# Patient Record
Sex: Male | Born: 1940
Health system: Southern US, Community
[De-identification: ages and names within clinical notes are randomized; demographics above are authoritative.]

## PROBLEM LIST (undated history)

## (undated) DIAGNOSIS — I4891 Unspecified atrial fibrillation: Secondary | ICD-10-CM

## (undated) DIAGNOSIS — R51 Headache: Secondary | ICD-10-CM

## (undated) DIAGNOSIS — H9190 Unspecified hearing loss, unspecified ear: Secondary | ICD-10-CM

## (undated) DIAGNOSIS — E119 Type 2 diabetes mellitus without complications: Secondary | ICD-10-CM

## (undated) DIAGNOSIS — C801 Malignant (primary) neoplasm, unspecified: Secondary | ICD-10-CM

## (undated) DIAGNOSIS — I10 Essential (primary) hypertension: Secondary | ICD-10-CM

## (undated) DIAGNOSIS — F0390 Unspecified dementia without behavioral disturbance: Secondary | ICD-10-CM

## (undated) DIAGNOSIS — E785 Hyperlipidemia, unspecified: Secondary | ICD-10-CM

## (undated) HISTORY — DX: Essential (primary) hypertension: I10

## (undated) HISTORY — DX: Headache: R51

## (undated) HISTORY — PX: OTHER SURGICAL HISTORY: SHX169

## (undated) HISTORY — DX: Hyperlipidemia, unspecified: E78.5

## (undated) HISTORY — DX: Type 2 diabetes mellitus without complications: E11.9

---

## 2000-12-29 ENCOUNTER — Encounter: Admission: RE | Admit: 2000-12-29 | Discharge: 2001-03-29 | Payer: Self-pay | Admitting: Family Medicine

## 2002-03-01 ENCOUNTER — Encounter: Admission: RE | Admit: 2002-03-01 | Discharge: 2002-03-01 | Payer: Self-pay | Admitting: *Deleted

## 2002-03-01 ENCOUNTER — Encounter: Payer: Self-pay | Admitting: *Deleted

## 2005-02-18 ENCOUNTER — Ambulatory Visit: Payer: Self-pay | Admitting: Gastroenterology

## 2011-11-05 DIAGNOSIS — E119 Type 2 diabetes mellitus without complications: Secondary | ICD-10-CM | POA: Diagnosis not present

## 2011-12-30 DIAGNOSIS — E785 Hyperlipidemia, unspecified: Secondary | ICD-10-CM | POA: Diagnosis not present

## 2011-12-30 DIAGNOSIS — E119 Type 2 diabetes mellitus without complications: Secondary | ICD-10-CM | POA: Diagnosis not present

## 2011-12-30 DIAGNOSIS — I10 Essential (primary) hypertension: Secondary | ICD-10-CM | POA: Diagnosis not present

## 2012-05-06 DIAGNOSIS — Z86010 Personal history of colonic polyps: Secondary | ICD-10-CM | POA: Diagnosis not present

## 2012-05-06 DIAGNOSIS — Z800 Family history of malignant neoplasm of digestive organs: Secondary | ICD-10-CM | POA: Diagnosis not present

## 2012-05-06 DIAGNOSIS — Z09 Encounter for follow-up examination after completed treatment for conditions other than malignant neoplasm: Secondary | ICD-10-CM | POA: Diagnosis not present

## 2012-05-06 DIAGNOSIS — K5730 Diverticulosis of large intestine without perforation or abscess without bleeding: Secondary | ICD-10-CM | POA: Diagnosis not present

## 2012-06-15 DIAGNOSIS — M25519 Pain in unspecified shoulder: Secondary | ICD-10-CM | POA: Diagnosis not present

## 2012-06-15 DIAGNOSIS — I10 Essential (primary) hypertension: Secondary | ICD-10-CM | POA: Diagnosis not present

## 2012-06-15 DIAGNOSIS — E785 Hyperlipidemia, unspecified: Secondary | ICD-10-CM | POA: Diagnosis not present

## 2012-06-15 DIAGNOSIS — Z125 Encounter for screening for malignant neoplasm of prostate: Secondary | ICD-10-CM | POA: Diagnosis not present

## 2012-06-15 DIAGNOSIS — Z1331 Encounter for screening for depression: Secondary | ICD-10-CM | POA: Diagnosis not present

## 2012-06-15 DIAGNOSIS — E119 Type 2 diabetes mellitus without complications: Secondary | ICD-10-CM | POA: Diagnosis not present

## 2012-12-14 DIAGNOSIS — Z0000 Encounter for general adult medical examination without abnormal findings: Secondary | ICD-10-CM | POA: Diagnosis not present

## 2012-12-14 DIAGNOSIS — E785 Hyperlipidemia, unspecified: Secondary | ICD-10-CM | POA: Diagnosis not present

## 2012-12-14 DIAGNOSIS — I10 Essential (primary) hypertension: Secondary | ICD-10-CM | POA: Diagnosis not present

## 2012-12-14 DIAGNOSIS — E119 Type 2 diabetes mellitus without complications: Secondary | ICD-10-CM | POA: Diagnosis not present

## 2012-12-17 DIAGNOSIS — E119 Type 2 diabetes mellitus without complications: Secondary | ICD-10-CM | POA: Diagnosis not present

## 2013-04-20 ENCOUNTER — Other Ambulatory Visit: Payer: Self-pay | Admitting: Family Medicine

## 2013-04-20 ENCOUNTER — Ambulatory Visit
Admission: RE | Admit: 2013-04-20 | Discharge: 2013-04-20 | Disposition: A | Discharge status: Home / Self Care | Payer: Medicare Other | Source: Ambulatory Visit | Attending: Family Medicine | Admitting: Family Medicine

## 2013-04-20 DIAGNOSIS — M7989 Other specified soft tissue disorders: Secondary | ICD-10-CM | POA: Diagnosis not present

## 2013-04-20 DIAGNOSIS — M79609 Pain in unspecified limb: Secondary | ICD-10-CM | POA: Diagnosis not present

## 2013-04-20 DIAGNOSIS — L02419 Cutaneous abscess of limb, unspecified: Secondary | ICD-10-CM | POA: Diagnosis not present

## 2013-04-20 DIAGNOSIS — R609 Edema, unspecified: Secondary | ICD-10-CM

## 2013-04-20 DIAGNOSIS — R52 Pain, unspecified: Secondary | ICD-10-CM

## 2013-06-17 DIAGNOSIS — I10 Essential (primary) hypertension: Secondary | ICD-10-CM | POA: Diagnosis not present

## 2013-06-17 DIAGNOSIS — E119 Type 2 diabetes mellitus without complications: Secondary | ICD-10-CM | POA: Diagnosis not present

## 2013-06-17 DIAGNOSIS — Z125 Encounter for screening for malignant neoplasm of prostate: Secondary | ICD-10-CM | POA: Diagnosis not present

## 2013-06-17 DIAGNOSIS — M25519 Pain in unspecified shoulder: Secondary | ICD-10-CM | POA: Diagnosis not present

## 2013-06-17 DIAGNOSIS — E785 Hyperlipidemia, unspecified: Secondary | ICD-10-CM | POA: Diagnosis not present

## 2013-07-23 DIAGNOSIS — E782 Mixed hyperlipidemia: Secondary | ICD-10-CM | POA: Diagnosis not present

## 2013-07-26 DIAGNOSIS — E119 Type 2 diabetes mellitus without complications: Secondary | ICD-10-CM | POA: Diagnosis not present

## 2013-07-26 DIAGNOSIS — R609 Edema, unspecified: Secondary | ICD-10-CM | POA: Diagnosis not present

## 2013-12-20 ENCOUNTER — Other Ambulatory Visit: Payer: Self-pay | Admitting: Family Medicine

## 2013-12-20 DIAGNOSIS — E119 Type 2 diabetes mellitus without complications: Secondary | ICD-10-CM | POA: Diagnosis not present

## 2013-12-20 DIAGNOSIS — Z125 Encounter for screening for malignant neoplasm of prostate: Secondary | ICD-10-CM | POA: Diagnosis not present

## 2013-12-20 DIAGNOSIS — Z0000 Encounter for general adult medical examination without abnormal findings: Secondary | ICD-10-CM | POA: Diagnosis not present

## 2013-12-20 DIAGNOSIS — I10 Essential (primary) hypertension: Secondary | ICD-10-CM | POA: Diagnosis not present

## 2013-12-20 DIAGNOSIS — M25519 Pain in unspecified shoulder: Secondary | ICD-10-CM | POA: Diagnosis not present

## 2013-12-20 DIAGNOSIS — Z87891 Personal history of nicotine dependence: Secondary | ICD-10-CM

## 2013-12-20 DIAGNOSIS — Z139 Encounter for screening, unspecified: Secondary | ICD-10-CM

## 2013-12-20 DIAGNOSIS — Z1331 Encounter for screening for depression: Secondary | ICD-10-CM | POA: Diagnosis not present

## 2013-12-20 DIAGNOSIS — Z23 Encounter for immunization: Secondary | ICD-10-CM | POA: Diagnosis not present

## 2013-12-20 DIAGNOSIS — E785 Hyperlipidemia, unspecified: Secondary | ICD-10-CM | POA: Diagnosis not present

## 2013-12-20 DIAGNOSIS — Z136 Encounter for screening for cardiovascular disorders: Secondary | ICD-10-CM | POA: Diagnosis not present

## 2013-12-27 ENCOUNTER — Ambulatory Visit
Admission: RE | Admit: 2013-12-27 | Discharge: 2013-12-27 | Disposition: A | Discharge status: Home / Self Care | Payer: Medicare Other | Source: Ambulatory Visit | Attending: Family Medicine | Admitting: Family Medicine

## 2013-12-27 DIAGNOSIS — Z139 Encounter for screening, unspecified: Secondary | ICD-10-CM

## 2013-12-27 DIAGNOSIS — I714 Abdominal aortic aneurysm, without rupture, unspecified: Secondary | ICD-10-CM | POA: Diagnosis not present

## 2013-12-27 DIAGNOSIS — Z87891 Personal history of nicotine dependence: Secondary | ICD-10-CM

## 2013-12-31 DIAGNOSIS — E119 Type 2 diabetes mellitus without complications: Secondary | ICD-10-CM | POA: Diagnosis not present

## 2014-01-11 DIAGNOSIS — E860 Dehydration: Secondary | ICD-10-CM | POA: Diagnosis not present

## 2014-01-18 ENCOUNTER — Ambulatory Visit (INDEPENDENT_AMBULATORY_CARE_PROVIDER_SITE_OTHER): Payer: Self-pay | Admitting: Family Medicine

## 2014-01-18 VITALS — BP 118/80 | HR 60 | Temp 97.9°F | Resp 17 | Ht 70.5 in | Wt 227.6 lb

## 2014-01-18 DIAGNOSIS — Z0289 Encounter for other administrative examinations: Secondary | ICD-10-CM

## 2014-01-18 NOTE — Progress Notes (Signed)
Chief Complaint:  Chief Complaint  Patient presents with  . DOT physical    HPI: Brian French is a 73 y.o. male who is here for  DOT PE Deneis any numbness/weakness/tingling/hypoglycemia UTD on eye exam, He jus had it 3 weeks, no diabetic retinopathy Deneis MI/OSA   Past Medical History  Diagnosis Date  . Diabetes mellitus without complication   . Hypertension   . Hyperlipidemia    Past Surgical History  Procedure Laterality Date  . Right testicular swelling      had I and D after swelling s/p trauma from scrotal  swelling, got hit in the testes during judo 1962   History   Social History  . Marital Status: Married    Spouse Name: N/A    Number of Children: N/A  . Years of Education: N/A   Social History Main Topics  . Smoking status: None  . Smokeless tobacco: None  . Alcohol Use: None  . Drug Use: None  . Sexual Activity: None   Other Topics Concern  . None   Social History Narrative  . None   Family History  Problem Relation Age of Onset  . Cancer Mother   . Heart disease Father    No Known Allergies Prior to Admission medications   Medication Sig Start Date End Date Taking? Authorizing Provider  atorvastatin (LIPITOR) 10 MG tablet Take 10 mg by mouth daily.   Yes Historical Provider, MD  glipiZIDE (GLUCOTROL) 5 MG tablet Take by mouth 2 (two) times daily before a meal.   Yes Historical Provider, MD  Liraglutide (VICTOZA) 18 MG/3ML SOPN Inject into the skin.   Yes Historical Provider, MD  lisinopril (PRINIVIL,ZESTRIL) 10 MG tablet Take 10 mg by mouth daily.   Yes Historical Provider, MD  meloxicam (MOBIC) 15 MG tablet Take 15 mg by mouth daily.   Yes Historical Provider, MD  metFORMIN (GLUCOPHAGE) 1000 MG tablet Take 1,000 mg by mouth 2 (two) times daily with a meal.   Yes Historical Provider, MD  Omega-3 Fatty Acids (FISH OIL) 1000 MG CAPS Take by mouth.   Yes Historical Provider, MD     ROS: The patient denies fevers, chills, night  sweats, unintentional weight loss, chest pain, palpitations, wheezing, dyspnea on exertion, nausea, vomiting, abdominal pain, dysuria, hematuria, melena, numbness, weakness, or tingling.   All other systems have been reviewed and were otherwise negative with the exception of those mentioned in the HPI and as above.    PHYSICAL EXAM: Filed Vitals:   01/18/14 1848  BP: 118/80  Pulse: 60  Temp: 97.9 F (36.6 C)  Resp: 17   Filed Vitals:   01/18/14 1848  Height: 5' 10.5" (1.791 m)  Weight: 227 lb 9.6 oz (103.239 kg)   Body mass index is 32.18 kg/(m^2).  General: Alert, no acute distress HEENT:  Normocephalic, atraumatic, oropharynx patent. EOMI, PERRLA, fundo exam nl Cardiovascular:  Regular rate and rhythm, no rubs murmurs or gallops.  No Carotid bruits, radial pulse intact. No pedal edema.  Respiratory: Clear to auscultation bilaterally.  No wheezes, rales, or rhonchi.  No cyanosis, no use of accessory musculature GI: No organomegaly, abdomen is soft and non-tender, positive bowel sounds.  No masses. Skin: No rashes. Neurologic: Facial musculature symmetric. Psychiatric: Patient is appropriate throughout our interaction. Lymphatic: No cervical lymphadenopathy Musculoskeletal: Gait intact. 5/5 stre, full ROM, 2/2 DTRS Neg hernia  LABS: No results found for this or any previous visit.   EKG/XRAY:  Primary read interpreted by Dr. Marin Comment at [facility].   ASSESSMENT/PLAN: Encounter Diagnosis  Name Primary?  . 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 Patient is aware that all medications have potential sideeffects and we are unable to predict every sideeffect or drug-drug interaction that may occur.  LE, [place], DO 01/18/2014 8:32 PM

## 2014-03-17 DIAGNOSIS — E782 Mixed hyperlipidemia: Secondary | ICD-10-CM | POA: Diagnosis not present

## 2014-06-23 DIAGNOSIS — R350 Frequency of micturition: Secondary | ICD-10-CM | POA: Diagnosis not present

## 2014-06-23 DIAGNOSIS — R609 Edema, unspecified: Secondary | ICD-10-CM | POA: Diagnosis not present

## 2014-06-23 DIAGNOSIS — E782 Mixed hyperlipidemia: Secondary | ICD-10-CM | POA: Diagnosis not present

## 2014-06-23 DIAGNOSIS — E119 Type 2 diabetes mellitus without complications: Secondary | ICD-10-CM | POA: Diagnosis not present

## 2014-06-23 DIAGNOSIS — R1905 Periumbilic swelling, mass or lump: Secondary | ICD-10-CM | POA: Diagnosis not present

## 2014-06-23 DIAGNOSIS — M25512 Pain in left shoulder: Secondary | ICD-10-CM | POA: Diagnosis not present

## 2014-06-23 DIAGNOSIS — I10 Essential (primary) hypertension: Secondary | ICD-10-CM | POA: Diagnosis not present

## 2014-06-23 DIAGNOSIS — K219 Gastro-esophageal reflux disease without esophagitis: Secondary | ICD-10-CM | POA: Diagnosis not present

## 2014-06-24 DIAGNOSIS — N400 Enlarged prostate without lower urinary tract symptoms: Secondary | ICD-10-CM | POA: Diagnosis not present

## 2014-06-24 DIAGNOSIS — K5790 Diverticulosis of intestine, part unspecified, without perforation or abscess without bleeding: Secondary | ICD-10-CM | POA: Diagnosis not present

## 2014-06-28 DIAGNOSIS — N3942 Incontinence without sensory awareness: Secondary | ICD-10-CM | POA: Diagnosis not present

## 2014-06-28 DIAGNOSIS — N401 Enlarged prostate with lower urinary tract symptoms: Secondary | ICD-10-CM | POA: Diagnosis not present

## 2014-06-28 DIAGNOSIS — Z125 Encounter for screening for malignant neoplasm of prostate: Secondary | ICD-10-CM | POA: Diagnosis not present

## 2014-06-28 DIAGNOSIS — N323 Diverticulum of bladder: Secondary | ICD-10-CM | POA: Diagnosis not present

## 2014-06-28 DIAGNOSIS — S3994XS Unspecified injury of external genitals, sequela: Secondary | ICD-10-CM | POA: Diagnosis not present

## 2014-07-14 DIAGNOSIS — N323 Diverticulum of bladder: Secondary | ICD-10-CM | POA: Diagnosis not present

## 2014-07-14 DIAGNOSIS — N401 Enlarged prostate with lower urinary tract symptoms: Secondary | ICD-10-CM | POA: Diagnosis not present

## 2014-07-14 DIAGNOSIS — N3942 Incontinence without sensory awareness: Secondary | ICD-10-CM | POA: Diagnosis not present

## 2014-08-01 DIAGNOSIS — N323 Diverticulum of bladder: Secondary | ICD-10-CM | POA: Diagnosis not present

## 2014-08-01 DIAGNOSIS — N3942 Incontinence without sensory awareness: Secondary | ICD-10-CM | POA: Diagnosis not present

## 2014-08-01 DIAGNOSIS — N1330 Unspecified hydronephrosis: Secondary | ICD-10-CM | POA: Diagnosis not present

## 2014-08-01 DIAGNOSIS — R338 Other retention of urine: Secondary | ICD-10-CM | POA: Diagnosis not present

## 2014-11-28 DIAGNOSIS — N401 Enlarged prostate with lower urinary tract symptoms: Secondary | ICD-10-CM | POA: Diagnosis not present

## 2014-11-28 DIAGNOSIS — R338 Other retention of urine: Secondary | ICD-10-CM | POA: Diagnosis not present

## 2014-11-28 DIAGNOSIS — N323 Diverticulum of bladder: Secondary | ICD-10-CM | POA: Diagnosis not present

## 2014-11-28 DIAGNOSIS — N1330 Unspecified hydronephrosis: Secondary | ICD-10-CM | POA: Diagnosis not present

## 2015-01-06 DIAGNOSIS — E119 Type 2 diabetes mellitus without complications: Secondary | ICD-10-CM | POA: Diagnosis not present

## 2015-01-18 DIAGNOSIS — R609 Edema, unspecified: Secondary | ICD-10-CM | POA: Diagnosis not present

## 2015-01-18 DIAGNOSIS — K219 Gastro-esophageal reflux disease without esophagitis: Secondary | ICD-10-CM | POA: Diagnosis not present

## 2015-01-18 DIAGNOSIS — Z1389 Encounter for screening for other disorder: Secondary | ICD-10-CM | POA: Diagnosis not present

## 2015-01-18 DIAGNOSIS — E782 Mixed hyperlipidemia: Secondary | ICD-10-CM | POA: Diagnosis not present

## 2015-01-18 DIAGNOSIS — I10 Essential (primary) hypertension: Secondary | ICD-10-CM | POA: Diagnosis not present

## 2015-01-18 DIAGNOSIS — E119 Type 2 diabetes mellitus without complications: Secondary | ICD-10-CM | POA: Diagnosis not present

## 2015-01-18 DIAGNOSIS — Z0000 Encounter for general adult medical examination without abnormal findings: Secondary | ICD-10-CM | POA: Diagnosis not present

## 2015-04-27 DIAGNOSIS — H8110 Benign paroxysmal vertigo, unspecified ear: Secondary | ICD-10-CM | POA: Diagnosis not present

## 2015-04-27 DIAGNOSIS — E119 Type 2 diabetes mellitus without complications: Secondary | ICD-10-CM | POA: Diagnosis not present

## 2015-04-27 DIAGNOSIS — I493 Ventricular premature depolarization: Secondary | ICD-10-CM | POA: Diagnosis not present

## 2015-04-27 DIAGNOSIS — Z9981 Dependence on supplemental oxygen: Secondary | ICD-10-CM | POA: Diagnosis not present

## 2015-04-27 DIAGNOSIS — R112 Nausea with vomiting, unspecified: Secondary | ICD-10-CM | POA: Diagnosis not present

## 2015-04-27 DIAGNOSIS — R42 Dizziness and giddiness: Secondary | ICD-10-CM | POA: Diagnosis not present

## 2015-04-27 DIAGNOSIS — R61 Generalized hyperhidrosis: Secondary | ICD-10-CM | POA: Diagnosis not present

## 2015-05-01 DIAGNOSIS — H8309 Labyrinthitis, unspecified ear: Secondary | ICD-10-CM | POA: Diagnosis not present

## 2015-05-05 DIAGNOSIS — H9313 Tinnitus, bilateral: Secondary | ICD-10-CM | POA: Diagnosis not present

## 2015-05-05 DIAGNOSIS — E119 Type 2 diabetes mellitus without complications: Secondary | ICD-10-CM | POA: Diagnosis not present

## 2015-05-05 DIAGNOSIS — R42 Dizziness and giddiness: Secondary | ICD-10-CM | POA: Diagnosis not present

## 2015-05-09 ENCOUNTER — Ambulatory Visit
Admission: RE | Admit: 2015-05-09 | Discharge: 2015-05-09 | Disposition: A | Discharge status: Home / Self Care | Payer: Medicare Other | Source: Ambulatory Visit | Attending: Family Medicine | Admitting: Family Medicine

## 2015-05-09 ENCOUNTER — Other Ambulatory Visit: Payer: Self-pay | Admitting: Family Medicine

## 2015-05-09 DIAGNOSIS — R42 Dizziness and giddiness: Secondary | ICD-10-CM

## 2015-05-09 DIAGNOSIS — Z01818 Encounter for other preprocedural examination: Secondary | ICD-10-CM | POA: Diagnosis not present

## 2015-05-09 DIAGNOSIS — Z77018 Contact with and (suspected) exposure to other hazardous metals: Secondary | ICD-10-CM

## 2015-05-17 DIAGNOSIS — R509 Fever, unspecified: Secondary | ICD-10-CM | POA: Diagnosis not present

## 2015-05-17 DIAGNOSIS — R42 Dizziness and giddiness: Secondary | ICD-10-CM | POA: Diagnosis not present

## 2015-05-17 DIAGNOSIS — I10 Essential (primary) hypertension: Secondary | ICD-10-CM | POA: Diagnosis not present

## 2015-05-17 DIAGNOSIS — E119 Type 2 diabetes mellitus without complications: Secondary | ICD-10-CM | POA: Diagnosis not present

## 2015-05-22 ENCOUNTER — Encounter: Payer: Self-pay | Admitting: Neurology

## 2015-05-22 ENCOUNTER — Ambulatory Visit (INDEPENDENT_AMBULATORY_CARE_PROVIDER_SITE_OTHER): Payer: Medicare Other | Admitting: Neurology

## 2015-05-22 VITALS — Ht 70.5 in | Wt 218.0 lb

## 2015-05-22 DIAGNOSIS — R42 Dizziness and giddiness: Secondary | ICD-10-CM | POA: Diagnosis not present

## 2015-05-22 DIAGNOSIS — R51 Headache: Secondary | ICD-10-CM

## 2015-05-22 DIAGNOSIS — H532 Diplopia: Secondary | ICD-10-CM

## 2015-05-22 DIAGNOSIS — R519 Headache, unspecified: Secondary | ICD-10-CM

## 2015-05-22 HISTORY — DX: Headache, unspecified: R51.9

## 2015-05-22 MED ORDER — TOPIRAMATE 25 MG PO TABS: ORAL_TABLET | ORAL | Status: DC

## 2015-05-22 NOTE — Progress Notes (Signed)
Reason for visit: Headache, vertigo  Referring physician: Dr. Erasmo Leventhal Brian French is a 74 y.o. male  History of present illness:  Mr. Brian French is a 74 year old right-handed white male with a history of a work-related injury that occurred in June 2016. The patient fell and hit his head, also sustaining 3 rib fractures. The patient indicates that since that time, he has been having some occasional headaches occurring 2 or 3 times a week, mainly bifrontal in nature. Three weeks prior to this evaluation, the patient indicates a relatively sudden onset of vertigo associated with some double vision, nausea and vomiting. The double vision was in the horizontal and vertical plane. The patient had increased vertigo when he would move his eyes side to side or up or down. 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 The patient began having severe headaches in the frontal areas shortly after the vertigo began, and the headaches have been daily since that time. The patient reports some photophobia with the headache, no phonophobia. The patient has had some ongoing nausea, but no vomiting. He reports no numbness or weakness of the extremities. He has not had any blackout episodes, he reports some occasional slight slurred speech. He has been taking meclizine for vertigo which has been somewhat beneficial. Again, he denies any neck stiffness or neck discomfort. He has not had any syncope or confusion. The balance has been slightly off, but he has not been having falls. He is sent to this office for an evaluation. He was seen previously by Dr. Janace Hoard from ENT, no abnormalities were seen. MRI evaluation of the brain done recently was notable for no acute changes, minimal small vessel changes were noted. The patient recently has reported some chills, no fevers have been documented. He is on antibiotics currently. He  reports no ear pain or loss of hearing.  Past Medical History  Diagnosis Date  . Diabetes mellitus without complication ([place])   . Hypertension   . Hyperlipidemia   . Headache disorder 05/22/2015    Past Surgical History  Procedure Laterality Date  . Right testicular swelling      had I and D after swelling s/p trauma from scrotal  swelling, got hit in the testes during judo 1962    Family History  Problem Relation Age of Onset  . Cancer Mother   . Heart disease Father     Social history:  reports that he quit smoking about 17 years ago. His smoking use included Cigarettes. He smoked 2.00 packs per day. He has never used smokeless tobacco. He reports that he drinks alcohol. His drug history is not on file.  Medications:  Prior to Admission medications   Medication Sig Start Date End Date Taking? Authorizing Provider  atorvastatin (LIPITOR) 10 MG tablet Take 10 mg by mouth daily.   Yes Historical Provider, MD  glipiZIDE (GLUCOTROL) 5 MG tablet Take by mouth 2 (two) times daily before a meal.   Yes Historical Provider, MD  lisinopril (PRINIVIL,ZESTRIL) 10 MG tablet Take 10 mg by mouth daily.   Yes Historical Provider, MD  meloxicam (MOBIC) 15 MG tablet Take 15 mg by mouth daily.   Yes Historical Provider, MD  metFORMIN (GLUCOPHAGE) 1000 MG tablet Take 1,000 mg by mouth 2 (two) times daily with a meal.   Yes Historical Provider, MD     No Known Allergies  ROS:  Out of a complete 14 system review of symptoms, the patient complains only of the following symptoms, and all other reviewed systems are negative.  Fevers, chills, weight loss Ringing in the ears, spinning sensations Double vision, decreased vision Cough Feeling hot, cold Headache, weakness, dizziness  Height 5' 10.5" (1.791 m), weight 218 lb (98.884 kg).  Physical Exam  General: The patient is alert and cooperative at the time of the examination.  Eyes: Pupils are equal, round, and reactive to light. Discs are  flat bilaterally.  Ears: Tympanic membranes are clear bilaterally.  Neck: The neck is supple, no carotid bruits are noted.  Respiratory: The respiratory examination is clear.  Cardiovascular: The cardiovascular examination reveals a regular rate and rhythm, no obvious murmurs or rubs are noted.  Skin: Extremities are without significant edema.  Neurologic Exam  Mental status: The patient is alert and oriented x 3 at the time of the examination. The patient has apparent normal recent and remote memory, with an apparently normal attention span and concentration ability.  Cranial nerves: Facial symmetry is present. There is good sensation of the face to pinprick and soft touch bilaterally. The strength of the facial muscles and the muscles to head turning and shoulder shrug are normal bilaterally. Speech is well enunciated, no aphasia or dysarthria is noted. Extraocular movements are full. Visual fields are full. The tongue is midline, and the patient has symmetric elevation of the soft palate. No obvious hearing deficits are noted.  Motor: The motor testing reveals 5 over 5 strength of all 4 extremities. Good symmetric motor tone is noted throughout.  Sensory: Sensory testing is intact to pinprick, soft touch, vibration sensation, and position sense on all 4 extremities. No evidence of extinction is noted.  Coordination: Cerebellar testing reveals good finger-nose-finger and heel-to-shin bilaterally.  Gait and station: Gait is normal. Tandem gait is normal. Romberg is negative. No drift is seen.  Reflexes: Deep tendon reflexes are symmetric and normal bilaterally. Toes are downgoing bilaterally.   MRI brain 05/09/15:   IMPRESSION: 1. No acute or subacute intracranial abnormality. 2. Scattered periventricular and subcortical T2 hyperintensities bilaterally. These are somewhat greater than expected for age. The finding is nonspecific but can be seen in the setting of  chronic microvascular ischemia, a demyelinating process such as multiple sclerosis, vasculitis, complicated migraine headaches, or as the sequelae of a prior infectious or inflammatory process.  * MRI scan images were reviewed online. I agree with the written report.   Assessment/Plan:  1. Reported headache, vertigo  The patient reports onset of headache associated with vertigo. He gives a history of a fall in June 2016, he has had intermittent headaches since that time, but now the headaches are daily in nature. The headache became daily shortly after the onset of the vertigo. It is possible that the patient may have a form of trauma triggered migraine. MRI of the brain did not show acute or subacute cerebrovascular disease, and the clinical examination today is normal. The patient will be placed on Topamax for the headache and vertigo, he will undergo blood work today. He will follow-up in 3 months. He has not been able to operate the truck since the onset of the vertigo.  Jill Alexanders MD 05/22/2015 6:20 PM  [facility] [address] [place], [place] [address]  Phone [phone] Fax [phone]

## 2015-05-22 NOTE — Patient Instructions (Addendum)
   We will start Topamax for the headache and the vertigo. We will check blood work today.  Topamax (topiramate) is a seizure medication that has an FDA approval for seizures and for migraine headache. Potential side effects of this medication include weight loss, cognitive slowing, tingling in the fingers and toes, and carbonated drinks will taste bad. If any significant side effects are noted on this drug, please contact our office.  Vertigo Vertigo means you feel like you or your surroundings are moving when they are not. Vertigo can be dangerous if it occurs when you are at work, driving, or performing difficult activities.  CAUSES  Vertigo occurs when there is a conflict of signals sent to your brain from the visual and sensory systems in your body. There are many different causes of vertigo, including:  Infections, especially in the inner ear.  A bad reaction to a drug or misuse of alcohol and medicines.  Withdrawal from drugs or alcohol.  Rapidly changing positions, such as lying down or rolling over in bed.  A migraine headache.  Decreased blood flow to the brain.  Increased pressure in the brain from a head injury, infection, tumor, or bleeding. SYMPTOMS  You may feel as though the world is spinning around or you are falling to the ground. Because your balance is upset, vertigo can cause nausea and vomiting. You may have involuntary eye movements (nystagmus). DIAGNOSIS  Vertigo is usually diagnosed by physical exam. If the cause of your vertigo is unknown, your caregiver may perform imaging tests, such as an MRI scan (magnetic resonance imaging). TREATMENT  Most cases of vertigo resolve on their own, without treatment. Depending on the cause, your caregiver may prescribe certain medicines. If your vertigo is related to body position issues, your caregiver may recommend movements or procedures to correct the problem. In rare cases, if your vertigo is caused by certain inner ear  problems, you may need surgery. HOME CARE INSTRUCTIONS   Follow your caregiver's instructions.  Avoid driving.  Avoid operating heavy machinery.  Avoid performing any tasks that would be dangerous to you or others during a vertigo episode.  Tell your caregiver if you notice that certain medicines seem to be causing your vertigo. Some of the medicines used to treat vertigo episodes can actually make them worse in some people. SEEK IMMEDIATE MEDICAL CARE IF:   Your medicines do not relieve your vertigo or are making it worse.  You develop problems with talking, walking, weakness, or using your arms, hands, or legs.  You develop severe headaches.  Your nausea or vomiting continues or gets worse.  You develop visual changes.  A family member notices behavioral changes.  Your condition gets worse. MAKE SURE YOU:  Understand these instructions.  Will watch your condition.  Will get help right away if you are not doing well or get worse.   This information is not intended to replace advice given to you by your health care provider. Make sure you discuss any questions you have with your health care provider.   Document Released: 04/10/2005 Document Revised: 09/23/2011 Document Reviewed: 10/24/2014 Elsevier Interactive Patient Education [employer].

## 2015-05-23 LAB — SEDIMENTATION RATE: Sed Rate: 12 mm/hr (ref 0–30)

## 2015-05-23 LAB — ACETYLCHOLINE RECEPTOR, BINDING: AChR Binding Ab, Serum: 0.1 nmol/L (ref 0.00–0.24)

## 2015-05-23 LAB — C-REACTIVE PROTEIN: CRP: 2.7 mg/L (ref 0.0–4.9)

## 2015-05-24 ENCOUNTER — Telehealth: Payer: Self-pay

## 2015-05-24 NOTE — Telephone Encounter (Signed)
-----   Message from Kathrynn Ducking, MD sent at 05/23/2015  1:44 PM EST -----  The blood work results are unremarkable. Please call the patient.  ----- Message -----    From: [facility] Lab Results In Interface    Sent: 05/23/2015   7:41 AM      To: Kathrynn Ducking, MD

## 2015-05-24 NOTE — Telephone Encounter (Signed)
Spoke to spouse. Gave lab results. Spouse verbalized understanding.

## 2015-05-29 ENCOUNTER — Telehealth: Payer: Self-pay | Admitting: Neurology

## 2015-05-29 NOTE — Telephone Encounter (Signed)
I called the patient. He believes that he is getting better, he is still having some headaches and some dizziness. I have asked him to call me at the end of the week to see how he is doing, if he continues to feel better, I'll let him return back to work on November 21.

## 2015-05-29 NOTE — Telephone Encounter (Signed)
Pt's wife called and would like to know if pt can return to driving. She states that during his office visit he was told not to drive. 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

## 2015-05-30 ENCOUNTER — Ambulatory Visit: Payer: Self-pay | Admitting: Neurology

## 2015-06-05 NOTE — Telephone Encounter (Signed)
I called the patient. He states he is feeling much better. He denies any dizziness. He states he has a mild headache at times and he takes aleve to help with it. He requested we fax a letter to Brian French at [phone] stating he is able to work and drive.

## 2015-06-05 NOTE — Telephone Encounter (Signed)
Letter faxed to [employer]. Confirmation received.

## 2015-06-05 NOTE — Telephone Encounter (Signed)
Patient called back per Dr. Jannifer Franklin' instructions.

## 2015-06-14 NOTE — Telephone Encounter (Signed)
I called patient. He is having side effects on the Topamax, most significant when he went up to 75 mg. He is having some nausea, decreased appetite. He feels somewhat dizzy. I'll have him go down to 50 mg at night for least 2 weeks, if he still is having side effects, he is to contact me and we will need to go to another type of medication.

## 2015-06-14 NOTE — Telephone Encounter (Signed)
Pt's wife Altha Harm called sts pt is having dizziness, no appetite and HA's since increasing topiramate (TOPAMAX) 25 MG tablet to 3 tab at night. Please call and advise at [phone]

## 2015-07-19 DIAGNOSIS — E119 Type 2 diabetes mellitus without complications: Secondary | ICD-10-CM | POA: Diagnosis not present

## 2015-07-19 DIAGNOSIS — I10 Essential (primary) hypertension: Secondary | ICD-10-CM | POA: Diagnosis not present

## 2015-07-19 DIAGNOSIS — Z7984 Long term (current) use of oral hypoglycemic drugs: Secondary | ICD-10-CM | POA: Diagnosis not present

## 2015-07-19 DIAGNOSIS — R609 Edema, unspecified: Secondary | ICD-10-CM | POA: Diagnosis not present

## 2015-07-19 DIAGNOSIS — K219 Gastro-esophageal reflux disease without esophagitis: Secondary | ICD-10-CM | POA: Diagnosis not present

## 2015-07-19 DIAGNOSIS — E782 Mixed hyperlipidemia: Secondary | ICD-10-CM | POA: Diagnosis not present

## 2015-08-10 DIAGNOSIS — N3942 Incontinence without sensory awareness: Secondary | ICD-10-CM | POA: Diagnosis not present

## 2015-08-10 DIAGNOSIS — N1330 Unspecified hydronephrosis: Secondary | ICD-10-CM | POA: Diagnosis not present

## 2015-08-10 DIAGNOSIS — N323 Diverticulum of bladder: Secondary | ICD-10-CM | POA: Diagnosis not present

## 2015-08-10 DIAGNOSIS — R338 Other retention of urine: Secondary | ICD-10-CM | POA: Diagnosis not present

## 2015-08-10 DIAGNOSIS — Z0000 Encounter for general adult medical examination without abnormal findings: Secondary | ICD-10-CM | POA: Diagnosis not present

## 2015-08-10 DIAGNOSIS — N401 Enlarged prostate with lower urinary tract symptoms: Secondary | ICD-10-CM | POA: Diagnosis not present

## 2015-08-11 DIAGNOSIS — E860 Dehydration: Secondary | ICD-10-CM | POA: Diagnosis not present

## 2015-11-22 ENCOUNTER — Encounter (HOSPITAL_BASED_OUTPATIENT_CLINIC_OR_DEPARTMENT_OTHER): Payer: Self-pay | Admitting: Emergency Medicine

## 2015-11-22 ENCOUNTER — Emergency Department (HOSPITAL_BASED_OUTPATIENT_CLINIC_OR_DEPARTMENT_OTHER)
Admission: EM | Admit: 2015-11-22 | Discharge: 2015-11-22 | Disposition: A | Discharge status: Home / Self Care | Payer: 59 | Attending: Emergency Medicine | Admitting: Emergency Medicine

## 2015-11-22 ENCOUNTER — Emergency Department (HOSPITAL_BASED_OUTPATIENT_CLINIC_OR_DEPARTMENT_OTHER): Payer: 59

## 2015-11-22 DIAGNOSIS — I10 Essential (primary) hypertension: Secondary | ICD-10-CM | POA: Diagnosis not present

## 2015-11-22 DIAGNOSIS — E785 Hyperlipidemia, unspecified: Secondary | ICD-10-CM | POA: Insufficient documentation

## 2015-11-22 DIAGNOSIS — Z87891 Personal history of nicotine dependence: Secondary | ICD-10-CM | POA: Insufficient documentation

## 2015-11-22 DIAGNOSIS — E119 Type 2 diabetes mellitus without complications: Secondary | ICD-10-CM | POA: Diagnosis not present

## 2015-11-22 DIAGNOSIS — G8929 Other chronic pain: Secondary | ICD-10-CM | POA: Insufficient documentation

## 2015-11-22 DIAGNOSIS — M25511 Pain in right shoulder: Secondary | ICD-10-CM | POA: Diagnosis not present

## 2015-11-22 MED ORDER — NAPROXEN 500 MG PO TABS: 500.0000 mg | ORAL_TABLET | Freq: Two times a day (BID) | ORAL | Status: DC | PRN

## 2015-11-22 MED ORDER — NAPROXEN 250 MG PO TABS
500.0000 mg | ORAL_TABLET | Freq: Once | ORAL | Status: AC
  Administered 2015-11-22: 500 mg via ORAL
  Filled 2015-11-22: qty 2

## 2015-11-22 NOTE — ED Provider Notes (Signed)
CSN: [id number]     Arrival date & time 11/22/15  1808 History  By signing my name below, I, Brian French, attest that this documentation has been prepared under the direction and in the presence of Forde Dandy, MD. Electronically Signed: Rayna French, ED Scribe. 11/22/2015. 9:54 PM.   Chief Complaint  Patient presents with  . Shoulder Pain   The history is provided by the patient. No language interpreter was used.    HPI Comments: Brian French is a 75 y.o. male who presents to the Emergency Department complaining of chronic, worsening, moderate, bilateral, right greater than left, aching, shoulder pain x 3 months. 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 His pain worsens with movement. Pt has taken tylenol for pain management without significant relief. He reports associated difficulty sleeping due to pain. He denies a PMHx of kidney issues. Pt denies numbness, weakness, chest pain, sob, joint swelling or any other associated symptoms at this time.   Past Medical History  Diagnosis Date  . Diabetes mellitus without complication ([place])   . Hypertension   . Hyperlipidemia   . Headache disorder 05/22/2015   Past Surgical History  Procedure Laterality Date  . Right testicular swelling      had I and D after swelling s/p trauma from scrotal  swelling, got hit in the testes during judo 1962   Family History  Problem Relation Age of Onset  . Cancer Mother   . Heart disease Father    Social History  Substance Use Topics  . Smoking status: Former Smoker -- 2.00 packs/day    Types: Cigarettes    Quit date: 07/15/1997  . Smokeless tobacco: Never Used  . Alcohol Use: 0.0 oz/week    0 Standard drinks or equivalent per week     Comment: occasionally    Review of Systems  Musculoskeletal: Positive for arthralgias. Negative for joint swelling.  Skin: Negative for wound.  Neurological: Negative for  weakness and numbness.  Psychiatric/Behavioral: Positive for sleep disturbance.   Allergies  Review of patient's allergies indicates no known allergies.  Home Medications   Prior to Admission medications   Medication Sig Start Date End Date Taking? Authorizing Provider  atorvastatin (LIPITOR) 10 MG tablet Take 10 mg by mouth daily.    Historical Provider, MD  glipiZIDE (GLUCOTROL) 5 MG tablet Take by mouth 2 (two) times daily before a meal.    Historical Provider, MD  lisinopril (PRINIVIL,ZESTRIL) 10 MG tablet Take 10 mg by mouth daily.    Historical Provider, MD  meloxicam (MOBIC) 15 MG tablet Take 15 mg by mouth daily.    Historical Provider, MD  metFORMIN (GLUCOPHAGE) 1000 MG tablet Take 1,000 mg by mouth 2 (two) times daily with a meal.    Historical Provider, MD  naproxen (NAPROSYN) 500 MG tablet Take 1 tablet (500 mg total) by mouth 2 (two) times daily as needed for mild pain or moderate pain. 11/22/15   Forde Dandy, MD  topiramate (TOPAMAX) 25 MG tablet Take one tablet at night for one week, then take 2 tablets at night for one week, then take 3 tablets at night. 05/22/15   Kathrynn Ducking, MD   BP 123/55 mmHg  Pulse 64  Temp(Src) 97.7 F (36.5 C) (Oral)  Resp 18  Ht '5\' 9"'$  (1.753 m)  Wt 225 lb (102.059 kg)  BMI 33.21 kg/m2  SpO2 98% Physical Exam  Nursing note and vitals reviewed. Constitutional: Well developed, well nourished, non-toxic, and in no acute distress Head: Normocephalic and atraumatic.  Mouth/Throat: Oropharynx is clear and moist.  Neck: Normal range of motion.  Cardiovascular:  +2 radial pulses bilaterally.  Pulmonary/Chest: Effort normal  Abdominal: Soft. There is no tenderness. There is no rebound and no guarding.  Musculoskeletal: Normal range of motion.He has tenderness over [facility] joint with ROM of right shoulder. No deformities.  Neurological: Alert, no facial droop, fluent speech, moves all extremities symmetrically, Innervation or radial, ulnar, median and  axillary nerves intact.  Skin: Skin is warm and dry.  Psychiatric: Cooperative  ED Course  Procedures  DIAGNOSTIC STUDIES: Oxygen Saturation is 100% on RA, normal by my interpretation.    COORDINATION OF CARE: 9:52 PM Discussed next steps with pt. He verbalized understanding and is agreeable with the plan.   Labs Review Labs Reviewed - No data to display  Imaging Review Dg Shoulder Right  11/22/2015  CLINICAL DATA:  75 year old male with right shoulder pain. EXAM: RIGHT SHOULDER - 2+ VIEW COMPARISON:  None. FINDINGS: No acute fracture or dislocation. The bones are mildly osteopenic. There is osteoarthritic changes of the right AC joint. The soft tissues are grossly unremarkable. IMPRESSION: No acute fracture or dislocation. Electronically Signed   By: Anner Crete M.D.   On: 11/22/2015 19:09   I have personally reviewed and evaluated these images and lab results as part of my medical decision-making.   EKG Interpretation None      MDM   Final diagnoses:  Right shoulder pain    75 year old male who presents with 3 months of progressive right shoulder pain. On presentation he is well-appearing in no acute distress with stable vital signs. No obvious deformity is noted, but does report some tenderness with range of motion of the right shoulder. X-ray shows no fracture, dislocation, but evidence of osteoarthritic changes of the right before meals joint where he localizes his pain with movement of the right shoulder. Likely overuse injury on top of his arthritis. Discussed continued supportive care with Tylenol and Naprosyn as needed ice packs and heat packs as needed. Strict return follow-up instructions reviewed. He expressed understanding of all discharge instructions and felt comfortable with plan of care.,   I personally performed the services described in this documentation, which was scribed in my presence. The recorded information has been reviewed and is  accurate.    Forde Dandy, MD 11/23/15 [phone]

## 2015-11-22 NOTE — ED Notes (Signed)
Right shoulder pain for "months".  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  No other known injury.  Cannot sleep at night due to pain.

## 2015-11-22 NOTE — Discharge Instructions (Signed)
Your x-ray shows mild arthritis involving the right shoulder, with overuse is likely causing her pain. Your x-ray does not show a broken bone or dislocation. I continue to take Tylenol 650 mg every 6 hours as needed for pain. Ice packs or heat packs to shoulder at rest. Naprosyn as needed for additional pain relief. Follow-up with your PCP closely regarding management of your pain from here.  Joint Pain Joint pain, which is also called arthralgia, can be caused by many things. Joint pain often goes away when you follow your health care provider's instructions for relieving pain at home. However, joint pain can also be caused by conditions that require further treatment. Common causes of joint pain include:  Bruising in the area of the joint.  Overuse of the joint.  Wear and tear on the joints that occur with aging (osteoarthritis).  Various other forms of arthritis.  A buildup of a crystal form of uric acid in the joint (gout).  Infections of the joint (septic arthritis) or of the bone (osteomyelitis). Your health care provider may recommend medicine to help with the pain. If your joint pain continues, additional tests may be needed to diagnose your condition. HOME CARE INSTRUCTIONS Watch your condition for any changes. Follow these instructions as directed to lessen the pain that you are feeling.  Take medicines only as directed by your health care provider.  Rest the affected area for as long as your health care provider says that you should. If directed to do so, raise the painful joint above the level of your heart while you are sitting or lying down.  Do not do things that cause or worsen pain.  If directed, apply ice to the painful area:  Put ice in a plastic bag.  Place a towel between your skin and the bag.  Leave the ice on for 20 minutes, 2-3 times per day.  Wear an elastic bandage, splint, or sling as directed by your health care provider. Loosen the elastic bandage or  splint if your fingers or toes become numb and tingle, or if they turn cold and blue.  Begin exercising or stretching the affected area as directed by your health care provider. Ask your health care provider what types of exercise are safe for you.  Keep all follow-up visits as directed by your health care provider. This is important. SEEK MEDICAL CARE IF:  Your pain increases, and medicine does not help.  Your joint pain does not improve within 3 days.  You have increased bruising or swelling.  You have a fever.  You lose 10 lb (4.5 kg) or more without trying. SEEK IMMEDIATE MEDICAL CARE IF:  You are not able to move the joint.  Your fingers or toes become numb or they turn cold and blue.   This information is not intended to replace advice given to you by your health care provider. Make sure you discuss any questions you have with your health care provider.   Document Released: 07/01/2005 Document Revised: 07/22/2014 Document Reviewed: 04/12/2014 Elsevier Interactive Patient Education [employer].

## 2016-01-17 DIAGNOSIS — E782 Mixed hyperlipidemia: Secondary | ICD-10-CM | POA: Diagnosis not present

## 2016-01-17 DIAGNOSIS — I714 Abdominal aortic aneurysm, without rupture: Secondary | ICD-10-CM | POA: Diagnosis not present

## 2016-01-17 DIAGNOSIS — R609 Edema, unspecified: Secondary | ICD-10-CM | POA: Diagnosis not present

## 2016-01-17 DIAGNOSIS — Z1389 Encounter for screening for other disorder: Secondary | ICD-10-CM | POA: Diagnosis not present

## 2016-01-17 DIAGNOSIS — E119 Type 2 diabetes mellitus without complications: Secondary | ICD-10-CM | POA: Diagnosis not present

## 2016-01-17 DIAGNOSIS — I10 Essential (primary) hypertension: Secondary | ICD-10-CM | POA: Diagnosis not present

## 2016-01-17 DIAGNOSIS — K219 Gastro-esophageal reflux disease without esophagitis: Secondary | ICD-10-CM | POA: Diagnosis not present

## 2016-01-17 DIAGNOSIS — Z0000 Encounter for general adult medical examination without abnormal findings: Secondary | ICD-10-CM | POA: Diagnosis not present

## 2016-06-04 DIAGNOSIS — J209 Acute bronchitis, unspecified: Secondary | ICD-10-CM | POA: Diagnosis not present

## 2016-06-04 DIAGNOSIS — R062 Wheezing: Secondary | ICD-10-CM | POA: Diagnosis not present

## 2016-06-21 ENCOUNTER — Telehealth: Payer: Self-pay | Admitting: Radiation Oncology

## 2016-06-21 NOTE — Telephone Encounter (Signed)
Returned patient's wife's call. Explained we are unable to refill her husband's Flomax because he has never been seen by our office. After further discussion it was determined she was trying to reach Dr. Zettie Pho office/[facility]. Provided her with Dr. Zettie Pho office number.

## 2016-07-23 DIAGNOSIS — K219 Gastro-esophageal reflux disease without esophagitis: Secondary | ICD-10-CM | POA: Diagnosis not present

## 2016-07-23 DIAGNOSIS — I10 Essential (primary) hypertension: Secondary | ICD-10-CM | POA: Diagnosis not present

## 2016-07-23 DIAGNOSIS — R609 Edema, unspecified: Secondary | ICD-10-CM | POA: Diagnosis not present

## 2016-07-23 DIAGNOSIS — E782 Mixed hyperlipidemia: Secondary | ICD-10-CM | POA: Diagnosis not present

## 2016-07-23 DIAGNOSIS — Z7984 Long term (current) use of oral hypoglycemic drugs: Secondary | ICD-10-CM | POA: Diagnosis not present

## 2016-07-23 DIAGNOSIS — E119 Type 2 diabetes mellitus without complications: Secondary | ICD-10-CM | POA: Diagnosis not present

## 2016-08-06 DIAGNOSIS — N1330 Unspecified hydronephrosis: Secondary | ICD-10-CM | POA: Diagnosis not present

## 2016-08-13 DIAGNOSIS — N323 Diverticulum of bladder: Secondary | ICD-10-CM | POA: Diagnosis not present

## 2016-08-13 DIAGNOSIS — N401 Enlarged prostate with lower urinary tract symptoms: Secondary | ICD-10-CM | POA: Diagnosis not present

## 2016-08-13 DIAGNOSIS — R3914 Feeling of incomplete bladder emptying: Secondary | ICD-10-CM | POA: Diagnosis not present

## 2016-08-13 DIAGNOSIS — R351 Nocturia: Secondary | ICD-10-CM | POA: Diagnosis not present

## 2016-11-04 DIAGNOSIS — N323 Diverticulum of bladder: Secondary | ICD-10-CM | POA: Diagnosis not present

## 2016-11-04 DIAGNOSIS — N1330 Unspecified hydronephrosis: Secondary | ICD-10-CM | POA: Diagnosis not present

## 2016-11-04 DIAGNOSIS — N401 Enlarged prostate with lower urinary tract symptoms: Secondary | ICD-10-CM | POA: Diagnosis not present

## 2016-11-04 DIAGNOSIS — R3914 Feeling of incomplete bladder emptying: Secondary | ICD-10-CM | POA: Diagnosis not present

## 2016-11-04 DIAGNOSIS — R351 Nocturia: Secondary | ICD-10-CM | POA: Diagnosis not present

## 2017-01-20 DIAGNOSIS — Z1389 Encounter for screening for other disorder: Secondary | ICD-10-CM | POA: Diagnosis not present

## 2017-01-20 DIAGNOSIS — H9193 Unspecified hearing loss, bilateral: Secondary | ICD-10-CM | POA: Diagnosis not present

## 2017-01-20 DIAGNOSIS — I10 Essential (primary) hypertension: Secondary | ICD-10-CM | POA: Diagnosis not present

## 2017-01-20 DIAGNOSIS — I714 Abdominal aortic aneurysm, without rupture: Secondary | ICD-10-CM | POA: Diagnosis not present

## 2017-01-20 DIAGNOSIS — E1122 Type 2 diabetes mellitus with diabetic chronic kidney disease: Secondary | ICD-10-CM | POA: Diagnosis not present

## 2017-01-20 DIAGNOSIS — R339 Retention of urine, unspecified: Secondary | ICD-10-CM | POA: Diagnosis not present

## 2017-01-20 DIAGNOSIS — E782 Mixed hyperlipidemia: Secondary | ICD-10-CM | POA: Diagnosis not present

## 2017-01-20 DIAGNOSIS — Z0000 Encounter for general adult medical examination without abnormal findings: Secondary | ICD-10-CM | POA: Diagnosis not present

## 2017-01-20 DIAGNOSIS — N183 Chronic kidney disease, stage 3 (moderate): Secondary | ICD-10-CM | POA: Diagnosis not present

## 2017-01-20 DIAGNOSIS — Z7984 Long term (current) use of oral hypoglycemic drugs: Secondary | ICD-10-CM | POA: Diagnosis not present

## 2017-01-24 ENCOUNTER — Other Ambulatory Visit: Payer: Self-pay | Admitting: Family Medicine

## 2017-01-24 DIAGNOSIS — I714 Abdominal aortic aneurysm, without rupture, unspecified: Secondary | ICD-10-CM

## 2017-02-07 ENCOUNTER — Ambulatory Visit
Admission: RE | Admit: 2017-02-07 | Discharge: 2017-02-07 | Disposition: A | Discharge status: Home / Self Care | Payer: Medicare Other | Source: Ambulatory Visit | Attending: Family Medicine | Admitting: Family Medicine

## 2017-02-07 DIAGNOSIS — I714 Abdominal aortic aneurysm, without rupture, unspecified: Secondary | ICD-10-CM

## 2017-02-07 DIAGNOSIS — N132 Hydronephrosis with renal and ureteral calculous obstruction: Secondary | ICD-10-CM | POA: Diagnosis not present

## 2017-05-05 DIAGNOSIS — Z23 Encounter for immunization: Secondary | ICD-10-CM | POA: Diagnosis not present

## 2018-12-23 DIAGNOSIS — H9313 Tinnitus, bilateral: Secondary | ICD-10-CM | POA: Diagnosis not present

## 2018-12-23 DIAGNOSIS — M26623 Arthralgia of bilateral temporomandibular joint: Secondary | ICD-10-CM | POA: Diagnosis not present

## 2018-12-23 DIAGNOSIS — H903 Sensorineural hearing loss, bilateral: Secondary | ICD-10-CM | POA: Diagnosis not present

## 2019-04-16 ENCOUNTER — Other Ambulatory Visit: Payer: Self-pay

## 2019-04-16 ENCOUNTER — Encounter (HOSPITAL_BASED_OUTPATIENT_CLINIC_OR_DEPARTMENT_OTHER): Payer: Self-pay | Admitting: *Deleted

## 2019-04-16 DIAGNOSIS — Z5321 Procedure and treatment not carried out due to patient leaving prior to being seen by health care provider: Secondary | ICD-10-CM | POA: Insufficient documentation

## 2019-04-16 DIAGNOSIS — R1031 Right lower quadrant pain: Secondary | ICD-10-CM | POA: Diagnosis present

## 2019-04-16 NOTE — ED Triage Notes (Signed)
Right flank pain radiating down into his right leg.  Reports increased pain with movement or laying down.  Reports that he is a self cath pt. Denies fever

## 2019-04-17 ENCOUNTER — Emergency Department (HOSPITAL_BASED_OUTPATIENT_CLINIC_OR_DEPARTMENT_OTHER)
Admission: EM | Admit: 2019-04-17 | Discharge: 2019-04-17 | Discharge status: Against Medical Advice | Payer: Medicare Other | Attending: Emergency Medicine | Admitting: Emergency Medicine

## 2019-04-17 NOTE — ED Notes (Signed)
Patient notified registration that he was leaving.

## 2019-04-25 ENCOUNTER — Other Ambulatory Visit: Payer: Self-pay

## 2019-04-25 ENCOUNTER — Emergency Department (HOSPITAL_BASED_OUTPATIENT_CLINIC_OR_DEPARTMENT_OTHER)
Admission: EM | Admit: 2019-04-25 | Discharge: 2019-04-25 | Disposition: A | Discharge status: Home / Self Care | Payer: Medicare Other | Attending: Emergency Medicine | Admitting: Emergency Medicine

## 2019-04-25 ENCOUNTER — Encounter (HOSPITAL_BASED_OUTPATIENT_CLINIC_OR_DEPARTMENT_OTHER): Payer: Self-pay | Admitting: Emergency Medicine

## 2019-04-25 ENCOUNTER — Emergency Department (HOSPITAL_BASED_OUTPATIENT_CLINIC_OR_DEPARTMENT_OTHER): Payer: Medicare Other

## 2019-04-25 DIAGNOSIS — E119 Type 2 diabetes mellitus without complications: Secondary | ICD-10-CM | POA: Diagnosis not present

## 2019-04-25 DIAGNOSIS — I10 Essential (primary) hypertension: Secondary | ICD-10-CM | POA: Insufficient documentation

## 2019-04-25 DIAGNOSIS — Z7984 Long term (current) use of oral hypoglycemic drugs: Secondary | ICD-10-CM | POA: Insufficient documentation

## 2019-04-25 DIAGNOSIS — J188 Other pneumonia, unspecified organism: Secondary | ICD-10-CM | POA: Diagnosis not present

## 2019-04-25 DIAGNOSIS — Z87891 Personal history of nicotine dependence: Secondary | ICD-10-CM | POA: Diagnosis not present

## 2019-04-25 DIAGNOSIS — J168 Pneumonia due to other specified infectious organisms: Secondary | ICD-10-CM | POA: Insufficient documentation

## 2019-04-25 DIAGNOSIS — J189 Pneumonia, unspecified organism: Secondary | ICD-10-CM

## 2019-04-25 DIAGNOSIS — J90 Pleural effusion, not elsewhere classified: Secondary | ICD-10-CM

## 2019-04-25 DIAGNOSIS — E785 Hyperlipidemia, unspecified: Secondary | ICD-10-CM | POA: Insufficient documentation

## 2019-04-25 DIAGNOSIS — Z79899 Other long term (current) drug therapy: Secondary | ICD-10-CM | POA: Diagnosis not present

## 2019-04-25 DIAGNOSIS — R1031 Right lower quadrant pain: Secondary | ICD-10-CM | POA: Diagnosis present

## 2019-04-25 DIAGNOSIS — R109 Unspecified abdominal pain: Secondary | ICD-10-CM | POA: Diagnosis not present

## 2019-04-25 LAB — HEPATIC FUNCTION PANEL
ALT: 21 U/L (ref 0–44)
AST: 16 U/L (ref 15–41)
Albumin: 3.7 g/dL (ref 3.5–5.0)
Alkaline Phosphatase: 70 U/L (ref 38–126)
Bilirubin, Direct: <0.1 mg/dL (ref 0.0–0.2)
Total Bilirubin: 0.5 mg/dL (ref 0.3–1.2)
Total Protein: 7.6 g/dL (ref 6.5–8.1)

## 2019-04-25 LAB — URINALYSIS, ROUTINE W REFLEX MICROSCOPIC
Bilirubin Urine: NEGATIVE
Glucose, UA: 250 mg/dL — AB
Hgb urine dipstick: NEGATIVE
Ketones, ur: 15 mg/dL — AB
Leukocytes,Ua: NEGATIVE
Nitrite: NEGATIVE
Protein, ur: 30 mg/dL — AB
Specific Gravity, Urine: 1.025 (ref 1.005–1.030)
pH: 6 (ref 5.0–8.0)

## 2019-04-25 LAB — BASIC METABOLIC PANEL
Anion gap: 12 (ref 5–15)
BUN: 25 mg/dL — ABNORMAL HIGH (ref 8–23)
CO2: 25 mmol/L (ref 22–32)
Calcium: 9.3 mg/dL (ref 8.9–10.3)
Chloride: 101 mmol/L (ref 98–111)
Creatinine, Ser: 1.27 mg/dL — ABNORMAL HIGH (ref 0.61–1.24)
GFR calc Af Amer: >60 mL/min (ref >60)
GFR calc non Af Amer: 54 mL/min — ABNORMAL LOW (ref >60)
Glucose, Bld: 204 mg/dL — ABNORMAL HIGH (ref 70–99)
Potassium: 3.9 mmol/L (ref 3.5–5.1)
Sodium: 138 mmol/L (ref 135–145)

## 2019-04-25 LAB — CBC
HCT: 42 % (ref 39.0–52.0)
Hemoglobin: 13.3 g/dL (ref 13.0–17.0)
MCH: 29.4 pg (ref 26.0–34.0)
MCHC: 31.7 g/dL (ref 30.0–36.0)
MCV: 92.9 fL (ref 80.0–100.0)
Platelets: 182 10*3/uL (ref 150–400)
RBC: 4.52 MIL/uL (ref 4.22–5.81)
RDW: 14.2 % (ref 11.5–15.5)
WBC: 14.5 10*3/uL — ABNORMAL HIGH (ref 4.0–10.5)
nRBC: 0 % (ref 0.0–0.2)

## 2019-04-25 LAB — URINALYSIS, MICROSCOPIC (REFLEX)

## 2019-04-25 MED ORDER — OXYCODONE HCL 5 MG PO TABS
5.0000 mg | ORAL_TABLET | Freq: Once | ORAL | Status: AC
  Administered 2019-04-25: 14:00:00 5 mg via ORAL
  Filled 2019-04-25: qty 1

## 2019-04-25 MED ORDER — CEPHALEXIN 500 MG PO CAPS: 500.0000 mg | ORAL_CAPSULE | Freq: Two times a day (BID) | ORAL | 0 refills | Status: DC

## 2019-04-25 MED ORDER — CYCLOBENZAPRINE HCL 10 MG PO TABS: 10.0000 mg | ORAL_TABLET | Freq: Three times a day (TID) | ORAL | 0 refills | Status: DC | PRN

## 2019-04-25 MED ORDER — DOXYCYCLINE HYCLATE 100 MG PO CAPS: 100.0000 mg | ORAL_CAPSULE | Freq: Two times a day (BID) | ORAL | 0 refills | Status: DC

## 2019-04-25 MED ORDER — LIDOCAINE 5 % EX PTCH: 1.0000 | MEDICATED_PATCH | CUTANEOUS | 0 refills | Status: AC

## 2019-04-25 MED ORDER — CYCLOBENZAPRINE HCL 10 MG PO TABS
10.0000 mg | ORAL_TABLET | Freq: Once | ORAL | Status: AC
  Administered 2019-04-25: 14:00:00 10 mg via ORAL
  Filled 2019-04-25: qty 1

## 2019-04-25 NOTE — ED Notes (Signed)
Pt self caths for urine, unable to urinate.

## 2019-04-25 NOTE — ED Triage Notes (Signed)
Pt reports right side flank pain x 7 days. Pt was diagnosed with kidney infection, endorses abx, however still having symptoms. Denies fever. Reports 1g tylenol at 0900 with no relief

## 2019-04-25 NOTE — ED Provider Notes (Signed)
[facility] EMERGENCY DEPARTMENT Provider Note   CSN: [id number] Arrival date & time: 04/25/19  1141     History   Chief Complaint Chief Complaint  Patient presents with   Flank Pain    HPI Brian Advani. is a 78 y.o. male.     HPI   78yo male with history of DM, hypertension, hyperlipidemia, presents with right flank pain worsening over the last month. Was seen at [facility] on Wednesday and given macrobid and told he had UTI.  Reports the pain was radiating down the right leg but is no longer. Now is present over right side, severe. Taking tylenol without relief.  The pain is worse with movement. No fevers, nausea, vomiting, diarrhea, costipation.  Has had a difficult time seeing his physician for these symptoms and given worsening despite outpt abx he has returned to the ED. No numbness/weakness, no saddle anesthesia, loss of control of bowel or bladder. Self cath for last 2 years.  Past Medical History:  Diagnosis Date   Diabetes mellitus without complication ([place])    Headache disorder 05/22/2015   Hyperlipidemia    Hypertension     Patient Active Problem List   Diagnosis Date Noted   Vertigo 05/22/2015   Headache disorder 05/22/2015    Past Surgical History:  Procedure Laterality Date   right testicular swelling     had I and D after swelling s/p trauma from scrotal  swelling, got hit in the testes during judo [place] Medications    Prior to Admission medications   Medication Sig Start Date End Date Taking? Authorizing Provider  atorvastatin (LIPITOR) 10 MG tablet Take 10 mg by mouth daily.    [provider]  cephALEXin (KEFLEX) 500 MG capsule Take 1 capsule (500 mg total) by mouth 2 (two) times daily for 7 days. 04/25/19 05/02/19  Gareth Morgan, MD  cyclobenzaprine (FLEXERIL) 10 MG tablet Take 1 tablet (10 mg total) by mouth 3 (three) times daily as needed for muscle spasms. 04/25/19   Gareth Morgan, MD  doxycycline  (VIBRAMYCIN) 100 MG capsule Take 1 capsule (100 mg total) by mouth 2 (two) times daily for 7 days. 04/25/19 05/02/19  Gareth Morgan, MD  glipiZIDE (GLUCOTROL) 5 MG tablet Take by mouth 2 (two) times daily before a meal.    [provider]  lidocaine (LIDODERM) 5 % Place 1 patch onto the skin daily. Remove & Discard patch within 12 hours or as directed by MD 04/25/19   Gareth Morgan, MD  lisinopril (PRINIVIL,ZESTRIL) 10 MG tablet Take 10 mg by mouth daily.    [provider]  meloxicam (MOBIC) 15 MG tablet Take 15 mg by mouth daily.    [provider]  metFORMIN (GLUCOPHAGE) 1000 MG tablet Take 1,000 mg by mouth 2 (two) times daily with a meal.    [provider]  naproxen (NAPROSYN) 500 MG tablet Take 1 tablet (500 mg total) by mouth 2 (two) times daily as needed for mild pain or moderate pain. 11/22/15   Forde Dandy, MD  topiramate (TOPAMAX) 25 MG tablet Take one tablet at night for one week, then take 2 tablets at night for one week, then take 3 tablets at night. 05/22/15   Kathrynn Ducking, MD    Family History Family History  Problem Relation Age of Onset   Cancer Mother    Heart disease Father     Social History Social History   Tobacco  Use   Smoking status: Former Smoker    Packs/day: 2.00    Types: Cigarettes    Quit date: 07/15/1997    Years since quitting: 21.7   Smokeless tobacco: Never Used  Substance Use Topics   Alcohol use: Yes    Alcohol/week: 0.0 standard drinks    Comment: occasionally   Drug use: Never     Allergies   Patient has no known allergies.   Review of Systems Review of Systems  Constitutional: Negative for fever.  HENT: Negative for sore throat.   Eyes: Negative for visual disturbance.  Respiratory: Negative for shortness of breath.   Cardiovascular: Negative for chest pain.  Gastrointestinal: Negative for abdominal pain, diarrhea, nausea and vomiting.  Genitourinary: Positive for difficulty  urinating (chronic self cath 2 yrs) and flank pain.  Musculoskeletal: Negative for back pain and neck stiffness.  Skin: Negative for rash.  Neurological: Negative for syncope, weakness, numbness and headaches.     Physical Exam Updated Vital Signs BP 130/62 (BP Location: Right Arm)    Pulse 80    Temp 98 F (36.7 C) (Oral)    Resp 16    Ht 5\' 11"  (1.803 m)    Wt 106.6 kg    SpO2 96%    BMI 32.78 kg/m   Physical Exam Vitals signs and nursing note reviewed.  Constitutional:      General: He is not in acute distress.    Appearance: He is well-developed. He is not diaphoretic.  HENT:     Head: Normocephalic and atraumatic.  Eyes:     Conjunctiva/sclera: Conjunctivae normal.  Neck:     Musculoskeletal: Normal range of motion.  Cardiovascular:     Rate and Rhythm: Normal rate and regular rhythm.  Pulmonary:     Effort: Pulmonary effort is normal. No respiratory distress.     Breath sounds: Normal breath sounds. No wheezing or rales.  Abdominal:     General: There is no distension.     Palpations: Abdomen is soft.     Tenderness: There is no abdominal tenderness. There is right CVA tenderness. There is no guarding.  Skin:    General: Skin is warm and dry.  Neurological:     Mental Status: He is alert and oriented to person, place, and time.      ED Treatments / Results  Labs (all labs ordered are listed, but only abnormal results are displayed) Labs Reviewed  URINALYSIS, ROUTINE W REFLEX MICROSCOPIC - Abnormal; Notable for the following components:      Result Value   Glucose, UA 250 (*)    Ketones, ur 15 (*)    Protein, ur 30 (*)    All other components within normal limits  BASIC METABOLIC PANEL - Abnormal; Notable for the following components:   Glucose, Bld 204 (*)    BUN 25 (*)    Creatinine, Ser 1.27 (*)    GFR calc non Af Amer 54 (*)    All other components within normal limits  CBC - Abnormal; Notable for the following components:   WBC 14.5 (*)    All  other components within normal limits  URINALYSIS, MICROSCOPIC (REFLEX) - Abnormal; Notable for the following components:   Bacteria, UA RARE (*)    All other components within normal limits  HEPATIC FUNCTION PANEL    EKG None  Radiology Ct Renal Stone Study  Result Date: 04/25/2019 CLINICAL DATA:  Right flank pain for 7 days. Elevated white blood cell count. EXAM:  CT ABDOMEN AND PELVIS WITHOUT CONTRAST TECHNIQUE: Multidetector CT imaging of the abdomen and pelvis was performed following the standard protocol without IV contrast. COMPARISON:  CT abdomen and pelvis 07/14/2014. FINDINGS: Lower chest: The patient has a small right pleural effusion. Focal airspace disease is seen in the right middle lobe. No left effusion or airspace disease. Small pericardial effusion is noted. Hepatobiliary: No focal liver abnormality is seen. No gallstones, gallbladder wall thickening, or biliary dilatation. Pancreas: Unremarkable. No pancreatic ductal dilatation or surrounding inflammatory changes. Spleen: Normal in size without focal abnormality. Adrenals/Urinary Tract: There is no hydronephrosis on the right or left. 4.2 cm simple left renal cyst is noted. The urinary bladder is incompletely distended. There is marked thickening the anterior, superior and lateral walls of the bladder. No gas is seen within the bladder walls or lumen of the bladder. Stomach/Bowel: Stomach is within normal limits. Appendix appears normal. No evidence of bowel wall thickening, distention, or inflammatory changes. Vascular/Lymphatic: The patient has extensive aortic atherosclerosis. The infrarenal aorta measures up to 3.1 x 3.0 cm. No lymphadenopathy. Reproductive: Prostate is unremarkable. Other: A small fat containing umbilical hernia is seen. Musculoskeletal: No acute or focal abnormality. Mild degenerative retrolisthesis L3 on L4 noted. IMPRESSION: Marked thickening of the anterior, superior and lateral walls of the urinary bladder  could be due to incomplete distension, chronic outlet obstruction or cystitis. Negative for hydronephrosis or urinary tract stone. Small right pleural effusion and focal airspace disease in the right middle lobe worrisome for pneumonia. Atherosclerosis with a 3.1 cm infrarenal abdominal aortic aneurysm. Recommend followup by ultrasound in 3 years. This recommendation follows ACR consensus guidelines: White Paper of the ACR Incidental Findings Committee II on Vascular Findings. [facility] Coll Radiol 2013; [phone] Electronically Signed   By: Inge Rise M.D.   On: 04/25/2019 14:34    Procedures Procedures (including critical care time)  Medications Ordered in ED Medications  cyclobenzaprine (FLEXERIL) tablet 10 mg (10 mg Oral Given 04/25/19 1339)  oxyCODONE (Oxy IR/ROXICODONE) immediate release tablet 5 mg (5 mg Oral Given 04/25/19 1339)     Initial Impression / Assessment and Plan / ED Course  I have reviewed the triage vital signs and the nursing notes.  Pertinent labs & imaging results that were available during my care of the patient were reviewed by me and considered in my medical decision making (see chart for details).         78yo male with history of DM, hypertension, hyperlipidemia, presents with right flank pain worsening over the last month and especially over last week despite taking abx for UTI rx at [place].  No sign of cauda equina by hx or exam, doubt epidural abscess. CT stone study done shows no evidence of stone, no evidence of abscess on noncontrast scan. Does show suspected right sided pneumonia and effusion, bladder findings likely secondary to chronic outlet obstruction (UA does not look consistent with infection.)   Does not have cough, but given pain and pleural effusion will treat for possible pneumonia with doxy/keflex. Also suspect msk etiology contributing to pain. Given rx for flexeril. Recommend PCP follow up.   Final Clinical Impressions(s) / ED Diagnoses    Final diagnoses:  Right flank pain  Pleural effusion on right  Pneumonia of right middle lobe due to infectious organism    ED Discharge Orders         Ordered    cyclobenzaprine (FLEXERIL) 10 MG tablet  3 times daily PRN     04/25/19  1505    cephALEXin (KEFLEX) 500 MG capsule  2 times daily     04/25/19 1505    doxycycline (VIBRAMYCIN) 100 MG capsule  2 times daily     04/25/19 1505    lidocaine (LIDODERM) 5 %  Every 24 hours     04/25/19 1505           Gareth Morgan, MD 04/26/19 1427

## 2019-04-29 ENCOUNTER — Inpatient Hospital Stay (HOSPITAL_BASED_OUTPATIENT_CLINIC_OR_DEPARTMENT_OTHER)
Admission: EM | Admit: 2019-04-29 | Discharge: 2019-05-03 | DRG: 194 | Disposition: A | Discharge status: Home / Self Care | Payer: Medicare Other | Attending: Internal Medicine | Admitting: Internal Medicine

## 2019-04-29 ENCOUNTER — Emergency Department (HOSPITAL_BASED_OUTPATIENT_CLINIC_OR_DEPARTMENT_OTHER): Payer: Medicare Other

## 2019-04-29 ENCOUNTER — Encounter (HOSPITAL_BASED_OUTPATIENT_CLINIC_OR_DEPARTMENT_OTHER): Payer: Self-pay | Admitting: *Deleted

## 2019-04-29 ENCOUNTER — Other Ambulatory Visit: Payer: Self-pay

## 2019-04-29 DIAGNOSIS — Z7984 Long term (current) use of oral hypoglycemic drugs: Secondary | ICD-10-CM

## 2019-04-29 DIAGNOSIS — R55 Syncope and collapse: Secondary | ICD-10-CM | POA: Diagnosis present

## 2019-04-29 DIAGNOSIS — Z791 Long term (current) use of non-steroidal anti-inflammatories (NSAID): Secondary | ICD-10-CM | POA: Diagnosis not present

## 2019-04-29 DIAGNOSIS — R911 Solitary pulmonary nodule: Secondary | ICD-10-CM | POA: Diagnosis not present

## 2019-04-29 DIAGNOSIS — R180 Malignant ascites: Secondary | ICD-10-CM | POA: Diagnosis present

## 2019-04-29 DIAGNOSIS — R109 Unspecified abdominal pain: Secondary | ICD-10-CM

## 2019-04-29 DIAGNOSIS — Z8249 Family history of ischemic heart disease and other diseases of the circulatory system: Secondary | ICD-10-CM

## 2019-04-29 DIAGNOSIS — J189 Pneumonia, unspecified organism: Principal | ICD-10-CM | POA: Diagnosis present

## 2019-04-29 DIAGNOSIS — E119 Type 2 diabetes mellitus without complications: Secondary | ICD-10-CM | POA: Diagnosis present

## 2019-04-29 DIAGNOSIS — E1169 Type 2 diabetes mellitus with other specified complication: Secondary | ICD-10-CM

## 2019-04-29 DIAGNOSIS — I10 Essential (primary) hypertension: Secondary | ICD-10-CM | POA: Diagnosis present

## 2019-04-29 DIAGNOSIS — C801 Malignant (primary) neoplasm, unspecified: Secondary | ICD-10-CM | POA: Diagnosis present

## 2019-04-29 DIAGNOSIS — E785 Hyperlipidemia, unspecified: Secondary | ICD-10-CM | POA: Diagnosis present

## 2019-04-29 DIAGNOSIS — Z87891 Personal history of nicotine dependence: Secondary | ICD-10-CM

## 2019-04-29 DIAGNOSIS — Z20828 Contact with and (suspected) exposure to other viral communicable diseases: Secondary | ICD-10-CM | POA: Diagnosis present

## 2019-04-29 DIAGNOSIS — J918 Pleural effusion in other conditions classified elsewhere: Secondary | ICD-10-CM | POA: Diagnosis present

## 2019-04-29 DIAGNOSIS — I4891 Unspecified atrial fibrillation: Secondary | ICD-10-CM | POA: Diagnosis present

## 2019-04-29 DIAGNOSIS — N10 Acute tubulo-interstitial nephritis: Secondary | ICD-10-CM | POA: Diagnosis present

## 2019-04-29 DIAGNOSIS — Z79899 Other long term (current) drug therapy: Secondary | ICD-10-CM

## 2019-04-29 DIAGNOSIS — R1084 Generalized abdominal pain: Secondary | ICD-10-CM

## 2019-04-29 DIAGNOSIS — I714 Abdominal aortic aneurysm, without rupture: Secondary | ICD-10-CM | POA: Diagnosis present

## 2019-04-29 DIAGNOSIS — R918 Other nonspecific abnormal finding of lung field: Secondary | ICD-10-CM | POA: Diagnosis not present

## 2019-04-29 DIAGNOSIS — J90 Pleural effusion, not elsewhere classified: Secondary | ICD-10-CM

## 2019-04-29 LAB — CBC WITH DIFFERENTIAL/PLATELET
Abs Immature Granulocytes: 0.12 10*3/uL — ABNORMAL HIGH (ref 0.00–0.07)
Basophils Absolute: 0.1 10*3/uL (ref 0.0–0.1)
Basophils Relative: 0 %
Eosinophils Absolute: 1.3 10*3/uL — ABNORMAL HIGH (ref 0.0–0.5)
Eosinophils Relative: 9 %
HCT: 42.2 % (ref 39.0–52.0)
Hemoglobin: 13.7 g/dL (ref 13.0–17.0)
Immature Granulocytes: 1 %
Lymphocytes Relative: 11 %
Lymphs Abs: 1.6 10*3/uL (ref 0.7–4.0)
MCH: 29.7 pg (ref 26.0–34.0)
MCHC: 32.5 g/dL (ref 30.0–36.0)
MCV: 91.5 fL (ref 80.0–100.0)
Monocytes Absolute: 1.8 10*3/uL — ABNORMAL HIGH (ref 0.1–1.0)
Monocytes Relative: 13 %
Neutro Abs: 9.6 10*3/uL — ABNORMAL HIGH (ref 1.7–7.7)
Neutrophils Relative %: 66 %
Platelets: 174 10*3/uL (ref 150–400)
RBC: 4.61 MIL/uL (ref 4.22–5.81)
RDW: 14.2 % (ref 11.5–15.5)
WBC: 14.5 10*3/uL — ABNORMAL HIGH (ref 4.0–10.5)
nRBC: 0 % (ref 0.0–0.2)

## 2019-04-29 LAB — COMPREHENSIVE METABOLIC PANEL
ALT: 17 U/L (ref 0–44)
AST: 10 U/L — ABNORMAL LOW (ref 15–41)
Albumin: 3.3 g/dL — ABNORMAL LOW (ref 3.5–5.0)
Alkaline Phosphatase: 70 U/L (ref 38–126)
Anion gap: 13 (ref 5–15)
BUN: 28 mg/dL — ABNORMAL HIGH (ref 8–23)
CO2: 22 mmol/L (ref 22–32)
Calcium: 9 mg/dL (ref 8.9–10.3)
Chloride: 101 mmol/L (ref 98–111)
Creatinine, Ser: 1.53 mg/dL — ABNORMAL HIGH (ref 0.61–1.24)
GFR calc Af Amer: 50 mL/min — ABNORMAL LOW (ref >60)
GFR calc non Af Amer: 43 mL/min — ABNORMAL LOW (ref >60)
Glucose, Bld: 302 mg/dL — ABNORMAL HIGH (ref 70–99)
Potassium: 4 mmol/L (ref 3.5–5.1)
Sodium: 136 mmol/L (ref 135–145)
Total Bilirubin: 0.7 mg/dL (ref 0.3–1.2)
Total Protein: 7.3 g/dL (ref 6.5–8.1)

## 2019-04-29 LAB — APTT: aPTT: 37 seconds — ABNORMAL HIGH (ref 24–36)

## 2019-04-29 LAB — URINALYSIS, ROUTINE W REFLEX MICROSCOPIC
Bilirubin Urine: NEGATIVE
Glucose, UA: 250 mg/dL — AB
Hgb urine dipstick: NEGATIVE
Ketones, ur: NEGATIVE mg/dL
Leukocytes,Ua: NEGATIVE
Nitrite: NEGATIVE
Protein, ur: NEGATIVE mg/dL
Specific Gravity, Urine: 1.01 (ref 1.005–1.030)
pH: 6 (ref 5.0–8.0)

## 2019-04-29 LAB — LACTIC ACID, PLASMA
Lactic Acid, Venous: 1.6 mmol/L (ref 0.5–1.9)
Lactic Acid, Venous: 1.2 mmol/L (ref 0.5–1.9)

## 2019-04-29 LAB — PROCALCITONIN: Procalcitonin: 0.18 ng/mL

## 2019-04-29 LAB — SARS CORONAVIRUS 2 (TAT 6-24 HRS): SARS Coronavirus 2: NEGATIVE

## 2019-04-29 LAB — LIPASE, BLOOD: Lipase: 22 U/L (ref 11–51)

## 2019-04-29 LAB — CBG MONITORING, ED: Glucose-Capillary: 288 mg/dL — ABNORMAL HIGH (ref 70–99)

## 2019-04-29 LAB — PROTIME-INR
INR: 1.2 (ref 0.8–1.2)
Prothrombin Time: 14.6 seconds (ref 11.4–15.2)

## 2019-04-29 MED ORDER — VANCOMYCIN HCL 1000 MG IV SOLR
INTRAVENOUS | Status: AC
  Filled 2019-04-29: qty 2000

## 2019-04-29 MED ORDER — MORPHINE SULFATE (PF) 4 MG/ML IV SOLN
4.0000 mg | Freq: Once | INTRAVENOUS | Status: AC
  Administered 2019-04-29: 4 mg via INTRAVENOUS
  Filled 2019-04-29: qty 1

## 2019-04-29 MED ORDER — SODIUM CHLORIDE 0.9 % IV SOLN: 2.0000 g | Freq: Once | INTRAVENOUS | Status: DC

## 2019-04-29 MED ORDER — VANCOMYCIN HCL IN DEXTROSE 1-5 GM/200ML-% IV SOLN: 1000.0000 mg | Freq: Once | INTRAVENOUS | Status: DC

## 2019-04-29 MED ORDER — SODIUM CHLORIDE 0.9 % IV SOLN
2.0000 g | Freq: Two times a day (BID) | INTRAVENOUS | Status: DC
  Administered 2019-04-29 – 2019-05-02 (×6): 2 g via INTRAVENOUS
  Filled 2019-04-29 (×8): qty 2

## 2019-04-29 MED ORDER — SODIUM CHLORIDE 0.9 % IV BOLUS
500.0000 mL | Freq: Once | INTRAVENOUS | Status: AC
  Administered 2019-04-29: 500 mL via INTRAVENOUS

## 2019-04-29 MED ORDER — METRONIDAZOLE IN NACL 5-0.79 MG/ML-% IV SOLN
500.0000 mg | Freq: Once | INTRAVENOUS | Status: AC
  Administered 2019-04-29: 500 mg via INTRAVENOUS
  Filled 2019-04-29: qty 100

## 2019-04-29 MED ORDER — IOHEXOL 350 MG/ML SOLN
100.0000 mL | Freq: Once | INTRAVENOUS | Status: AC | PRN
  Administered 2019-04-29: 100 mL via INTRAVENOUS

## 2019-04-29 MED ORDER — VANCOMYCIN HCL 10 G IV SOLR
2000.0000 mg | Freq: Once | INTRAVENOUS | Status: AC
  Administered 2019-04-29: 2000 mg via INTRAVENOUS
  Filled 2019-04-29: qty 2000

## 2019-04-29 MED ORDER — SODIUM CHLORIDE 0.9 % IV BOLUS
1000.0000 mL | Freq: Once | INTRAVENOUS | Status: AC
  Administered 2019-04-29: 1000 mL via INTRAVENOUS

## 2019-04-29 MED ORDER — VANCOMYCIN HCL IN DEXTROSE 1-5 GM/200ML-% IV SOLN
1000.0000 mg | INTRAVENOUS | Status: DC
  Administered 2019-04-30: 1000 mg via INTRAVENOUS
  Filled 2019-04-29: qty 200

## 2019-04-29 MED ORDER — LEVOFLOXACIN IN D5W 750 MG/150ML IV SOLN
750.0000 mg | Freq: Once | INTRAVENOUS | Status: DC
  Filled 2019-04-29: qty 150

## 2019-04-29 NOTE — ED Notes (Signed)
Pt. Is in A Fib at present time and reports he doesn't feel right.

## 2019-04-29 NOTE — ED Provider Notes (Signed)
With a right-sided pneumonia.  Probably explaining most of his symptoms.  Is also possibility about pyelonephritis.  Urinalysis patient self cath so we cathed him and sent for culture.  Patient's been on the antibiotic doxycycline and Keflex, since his last visit on October 11.  Patient is actually gotten worse.  Lactic acid is not elevated.  Covid testing is been ordered.  Patient will be switched over to Levaquin IV.  Based on the atrial fibrillation and the episode of severe bradycardia they have patient requires admission.  Also his outpatient failure of oral antibiotics.  Patient's CT angio chest without evidence of PE distinctly.  There is a right-sided pleural effusion.   Results for orders placed or performed during the hospital encounter of 04/29/19  CBC with Differential  Result Value Ref Range   WBC 14.5 (H) 4.0 - 10.5 K/uL   RBC 4.61 4.22 - 5.81 MIL/uL   Hemoglobin 13.7 13.0 - 17.0 g/dL   HCT 42.2 39.0 - 52.0 %   MCV 91.5 80.0 - 100.0 fL   MCH 29.7 26.0 - 34.0 pg   MCHC 32.5 30.0 - 36.0 g/dL   RDW 14.2 11.5 - 15.5 %   Platelets 174 150 - 400 K/uL   nRBC 0.0 0.0 - 0.2 %   Neutrophils Relative % 66 %   Neutro Abs 9.6 (H) 1.7 - 7.7 K/uL   Lymphocytes Relative 11 %   Lymphs Abs 1.6 0.7 - 4.0 K/uL   Monocytes Relative 13 %   Monocytes Absolute 1.8 (H) 0.1 - 1.0 K/uL   Eosinophils Relative 9 %   Eosinophils Absolute 1.3 (H) 0.0 - 0.5 K/uL   Basophils Relative 0 %   Basophils Absolute 0.1 0.0 - 0.1 K/uL   Immature Granulocytes 1 %   Abs Immature Granulocytes 0.12 (H) 0.00 - 0.07 K/uL  Comprehensive metabolic panel  Result Value Ref Range   Sodium 136 135 - 145 mmol/L   Potassium 4.0 3.5 - 5.1 mmol/L   Chloride 101 98 - 111 mmol/L   CO2 22 22 - 32 mmol/L   Glucose, Bld 302 (H) 70 - 99 mg/dL   BUN 28 (H) 8 - 23 mg/dL   Creatinine, Ser 1.53 (H) 0.61 - 1.24 mg/dL   Calcium 9.0 8.9 - 10.3 mg/dL   Total Protein 7.3 6.5 - 8.1 g/dL   Albumin 3.3 (L) 3.5 - 5.0 g/dL   AST 10 (L) 15  - 41 U/L   ALT 17 0 - 44 U/L   Alkaline Phosphatase 70 38 - 126 U/L   Total Bilirubin 0.7 0.3 - 1.2 mg/dL   GFR calc non Af Amer 43 (L) >60 mL/min   GFR calc Af Amer 50 (L) >60 mL/min   Anion gap 13 5 - 15  Lipase, blood  Result Value Ref Range   Lipase 22 11 - 51 U/L  Lactic acid, plasma  Result Value Ref Range   Lactic Acid, Venous 1.6 0.5 - 1.9 mmol/L  CBG monitoring, ED  Result Value Ref Range   Glucose-Capillary 288 (H) 70 - 99 mg/dL   Ct Angio Chest Pe W And/or Wo Contrast  Result Date: 04/29/2019 CLINICAL DATA:  Acute abdominal and back pain. EXAM: CT ANGIOGRAPHY CHEST CT ABDOMEN AND PELVIS WITH CONTRAST TECHNIQUE: Multidetector CT imaging of the chest was performed using the standard protocol during bolus administration of intravenous contrast. Multiplanar CT image reconstructions and MIPs were obtained to evaluate the vascular anatomy. Multidetector CT imaging of the abdomen and  pelvis was performed using the standard protocol during bolus administration of intravenous contrast. CONTRAST:  173mL OMNIPAQUE IOHEXOL 350 MG/ML SOLN COMPARISON:  April 25, 2019. FINDINGS: CTA CHEST FINDINGS Cardiovascular: No evidence of large central pulmonary embolus is noted. However there appears to be non opacification of a branch of the right pulmonary artery in the right middle lobe; is uncertain if this is due to pulmonary embolus or compression from surrounding atelectasis or inflammation. Normal heart size. No pericardial effusion. Atherosclerosis of thoracic aorta is noted without aneurysm formation. Coronary artery calcifications are noted. Mediastinum/Nodes: 11 mm pretracheal lymph node is noted. 3 cm subcarinal adenopathy is noted. Esophagus is unremarkable. Thyroid gland is unremarkable. Lungs/Pleura: No pneumothorax is noted. Left lung is clear. Moderate right pleural effusion is noted with adjacent subsegmental atelectasis in the right lower lobe. Right middle lobe atelectasis or pneumonia  is noted. Air bronchogram is noted. 7 mm nodular density is noted in right middle lobe best seen on image number 53 of series 5. Ill-defined opacity is noted in the right upper lobe most consistent with pneumonia. Musculoskeletal: No chest wall abnormality. No acute or significant osseous findings. Review of the MIP images confirms the above findings. CT ABDOMEN and PELVIS FINDINGS Hepatobiliary: No focal liver abnormality is seen. No gallstones, gallbladder wall thickening, or biliary dilatation. Pancreas: Unremarkable. No pancreatic ductal dilatation or surrounding inflammatory changes. Spleen: Normal in size without focal abnormality. Adrenals/Urinary Tract: 1.6 cm left adrenal nodule is noted. Right adrenal gland is unremarkable. Left renal cyst is noted. Right renal atrophy and cortical scarring is noted. No hydronephrosis or renal obstruction is noted. There are multiple rounded ill-defined low densities involving both kidneys which may represent renal infarction or possibly lobar nephronia. Urinary bladder is mildly distended with trabeculations. Stomach/Bowel: Stomach is within normal limits. Appendix appears normal. No evidence of bowel wall thickening, distention, or inflammatory changes. Vascular/Lymphatic: 3 cm infrarenal abdominal aortic aneurysm is noted. No adenopathy is noted. Reproductive: Prostate is unremarkable. Other: No abdominal wall hernia or abnormality. No abdominopelvic ascites. Musculoskeletal: No acute or significant osseous findings. Review of the MIP images confirms the above findings. IMPRESSION: There is no definite evidence of large central pulmonary embolus. However, there is noted lack of opacification of the distal portion of a branch of the right pulmonary artery in the right middle lobe; it is uncertain if this is due to pulmonary embolus or compression from adjacent atelectasis or pneumonia. Ill-defined low densities are noted throughout both kidneys, concerning for possible  multiple areas of infarction or possibly pyelonephritis. Clinical correlation is recommended. Right renal atrophy and cortical scarring is noted. As noted above, large opacity is seen in right middle lobe concerning for pneumonia or atelectasis. Endobronchial obstruction can not be excluded. Also noted is mediastinal adenopathy which may be inflammatory or neoplastic in origin. Clinical correlation is recommended. Moderate right pleural effusion is noted with adjacent subsegmental atelectasis of the right lower lobe. Probable right upper lobe pneumonia is noted. 7 mm nodule seen in right middle lobe. Non-contrast chest CT at 6-12 months is recommended. If the nodule is stable at time of repeat CT, then future CT at 18-24 months (from today's scan) is considered optional for low-risk patients, but is recommended for high-risk patients. This recommendation follows the consensus statement: Guidelines for Management of Incidental Pulmonary Nodules Detected on CT Images: From the Fleischner Society 2017; Radiology 2017; 284:228-243. Coronary artery calcifications are noted. 1.6 cm left adrenal nodule is noted. Follow-up CT scan or MRI in 12  months is recommended ensure stability. 3 cm infrarenal abdominal aortic aneurysm. Recommend followup by ultrasound in 3 years. This recommendation follows ACR consensus guidelines: White Paper of the ACR Incidental Findings Committee II on Vascular Findings. J Am Coll Radiol 2013; 10:789-794. Aortic aneurysm NOS (ICD10-I71.9). Aortic Atherosclerosis (ICD10-I70.0). Electronically Signed   By: Marijo Conception M.D.   On: 04/29/2019 16:09   Ct Abdomen Pelvis W Contrast  Result Date: 04/29/2019 CLINICAL DATA:  Acute abdominal and back pain. EXAM: CT ANGIOGRAPHY CHEST CT ABDOMEN AND PELVIS WITH CONTRAST TECHNIQUE: Multidetector CT imaging of the chest was performed using the standard protocol during bolus administration of intravenous contrast. Multiplanar CT image reconstructions and  MIPs were obtained to evaluate the vascular anatomy. Multidetector CT imaging of the abdomen and pelvis was performed using the standard protocol during bolus administration of intravenous contrast. CONTRAST:  158mL OMNIPAQUE IOHEXOL 350 MG/ML SOLN COMPARISON:  April 25, 2019. FINDINGS: CTA CHEST FINDINGS Cardiovascular: No evidence of large central pulmonary embolus is noted. However there appears to be non opacification of a branch of the right pulmonary artery in the right middle lobe; is uncertain if this is due to pulmonary embolus or compression from surrounding atelectasis or inflammation. Normal heart size. No pericardial effusion. Atherosclerosis of thoracic aorta is noted without aneurysm formation. Coronary artery calcifications are noted. Mediastinum/Nodes: 11 mm pretracheal lymph node is noted. 3 cm subcarinal adenopathy is noted. Esophagus is unremarkable. Thyroid gland is unremarkable. Lungs/Pleura: No pneumothorax is noted. Left lung is clear. Moderate right pleural effusion is noted with adjacent subsegmental atelectasis in the right lower lobe. Right middle lobe atelectasis or pneumonia is noted. Air bronchogram is noted. 7 mm nodular density is noted in right middle lobe best seen on image number 53 of series 5. Ill-defined opacity is noted in the right upper lobe most consistent with pneumonia. Musculoskeletal: No chest wall abnormality. No acute or significant osseous findings. Review of the MIP images confirms the above findings. CT ABDOMEN and PELVIS FINDINGS Hepatobiliary: No focal liver abnormality is seen. No gallstones, gallbladder wall thickening, or biliary dilatation. Pancreas: Unremarkable. No pancreatic ductal dilatation or surrounding inflammatory changes. Spleen: Normal in size without focal abnormality. Adrenals/Urinary Tract: 1.6 cm left adrenal nodule is noted. Right adrenal gland is unremarkable. Left renal cyst is noted. Right renal atrophy and cortical scarring is noted. No  hydronephrosis or renal obstruction is noted. There are multiple rounded ill-defined low densities involving both kidneys which may represent renal infarction or possibly lobar nephronia. Urinary bladder is mildly distended with trabeculations. Stomach/Bowel: Stomach is within normal limits. Appendix appears normal. No evidence of bowel wall thickening, distention, or inflammatory changes. Vascular/Lymphatic: 3 cm infrarenal abdominal aortic aneurysm is noted. No adenopathy is noted. Reproductive: Prostate is unremarkable. Other: No abdominal wall hernia or abnormality. No abdominopelvic ascites. Musculoskeletal: No acute or significant osseous findings. Review of the MIP images confirms the above findings. IMPRESSION: There is no definite evidence of large central pulmonary embolus. However, there is noted lack of opacification of the distal portion of a branch of the right pulmonary artery in the right middle lobe; it is uncertain if this is due to pulmonary embolus or compression from adjacent atelectasis or pneumonia. Ill-defined low densities are noted throughout both kidneys, concerning for possible multiple areas of infarction or possibly pyelonephritis. Clinical correlation is recommended. Right renal atrophy and cortical scarring is noted. As noted above, large opacity is seen in right middle lobe concerning for pneumonia or atelectasis. Endobronchial obstruction can not  be excluded. Also noted is mediastinal adenopathy which may be inflammatory or neoplastic in origin. Clinical correlation is recommended. Moderate right pleural effusion is noted with adjacent subsegmental atelectasis of the right lower lobe. Probable right upper lobe pneumonia is noted. 7 mm nodule seen in right middle lobe. Non-contrast chest CT at 6-12 months is recommended. If the nodule is stable at time of repeat CT, then future CT at 18-24 months (from today's scan) is considered optional for low-risk patients, but is recommended for  high-risk patients. This recommendation follows the consensus statement: Guidelines for Management of Incidental Pulmonary Nodules Detected on CT Images: From the Fleischner Society 2017; Radiology 2017; 284:228-243. Coronary artery calcifications are noted. 1.6 cm left adrenal nodule is noted. Follow-up CT scan or MRI in 12 months is recommended ensure stability. 3 cm infrarenal abdominal aortic aneurysm. Recommend followup by ultrasound in 3 years. This recommendation follows ACR consensus guidelines: White Paper of the ACR Incidental Findings Committee II on Vascular Findings. J Am Coll Radiol 2013; 10:789-794. Aortic aneurysm NOS (ICD10-I71.9). Aortic Atherosclerosis (ICD10-I70.0). Electronically Signed   By: Marijo Conception M.D.   On: 04/29/2019 16:09   Dg Chest Portable 1 View  Result Date: 04/29/2019 CLINICAL DATA:  Flank pain. EXAM: PORTABLE CHEST 1 VIEW COMPARISON:  Body CT April 25, 2019 FINDINGS: Cardiomediastinal silhouette is normal. Mediastinal contours appear intact. Large airspace opacity in the right cardiophrenic angle. Probable right pleural effusion. Osseous structures are without acute abnormality. Soft tissues are grossly normal. IMPRESSION: 1. Large airspace opacity in the right cardiophrenic angle may represent airspace consolidation or a pulmonary mass. 2. Probable right pleural effusion. Electronically Signed   By: Fidela Salisbury M.D.   On: 04/29/2019 15:22   Ct Renal Stone Study  Result Date: 04/25/2019 CLINICAL DATA:  Right flank pain for 7 days. Elevated white blood cell count. EXAM: CT ABDOMEN AND PELVIS WITHOUT CONTRAST TECHNIQUE: Multidetector CT imaging of the abdomen and pelvis was performed following the standard protocol without IV contrast. COMPARISON:  CT abdomen and pelvis 07/14/2014. FINDINGS: Lower chest: The patient has a small right pleural effusion. Focal airspace disease is seen in the right middle lobe. No left effusion or airspace disease. Small  pericardial effusion is noted. Hepatobiliary: No focal liver abnormality is seen. No gallstones, gallbladder wall thickening, or biliary dilatation. Pancreas: Unremarkable. No pancreatic ductal dilatation or surrounding inflammatory changes. Spleen: Normal in size without focal abnormality. Adrenals/Urinary Tract: There is no hydronephrosis on the right or left. 4.2 cm simple left renal cyst is noted. The urinary bladder is incompletely distended. There is marked thickening the anterior, superior and lateral walls of the bladder. No gas is seen within the bladder walls or lumen of the bladder. Stomach/Bowel: Stomach is within normal limits. Appendix appears normal. No evidence of bowel wall thickening, distention, or inflammatory changes. Vascular/Lymphatic: The patient has extensive aortic atherosclerosis. The infrarenal aorta measures up to 3.1 x 3.0 cm. No lymphadenopathy. Reproductive: Prostate is unremarkable. Other: A small fat containing umbilical hernia is seen. Musculoskeletal: No acute or focal abnormality. Mild degenerative retrolisthesis L3 on L4 noted. IMPRESSION: Marked thickening of the anterior, superior and lateral walls of the urinary bladder could be due to incomplete distension, chronic outlet obstruction or cystitis. Negative for hydronephrosis or urinary tract stone. Small right pleural effusion and focal airspace disease in the right middle lobe worrisome for pneumonia. Atherosclerosis with a 3.1 cm infrarenal abdominal aortic aneurysm. Recommend followup by ultrasound in 3 years. This recommendation follows ACR consensus guidelines:  White Paper of the ACR Incidental Findings Committee II on Vascular Findings. [employer] Coll Radiol 2013; [phone] Electronically Signed   By: Inge Rise M.D.   On: 04/25/2019 14:34   Addendum.  Discussion with the admitting hospitalist that [facility] wants to go ahead and broaden the antibiotics out and then they can narrow down once we get more  information.  So use the sepsis order set ordered a procalcitonin for them.  We will continue out with the lactic acids.  Noted broad-spectrum antibiotics for unknown source.  Patient's been accepted to [facility].  Telemetry bed.   Fredia Sorrow, MD 04/29/19 [phone]

## 2019-04-29 NOTE — ED Notes (Signed)
Pt. Reports he caths himself approx. Every 8 hours and he last cathed at 8-9am today.

## 2019-04-29 NOTE — ED Notes (Signed)
Pt. Was complaining of his abd. Hurting then his HR was up to 138 at the time he was lying down on stretcher.  He turned his head and HR dropped on the monitor to 38-23 and Pt. Said he just feels like he needs to rest

## 2019-04-29 NOTE — ED Triage Notes (Signed)
Abdominal and back pain for 2 weeks. States he was seen for same 4 days ago. He was diagnosed with pneumonia and UTI he thinks. Pt is ambulatory. Alert and oriented.

## 2019-04-29 NOTE — ED Notes (Signed)
ED Provider at bedside. 

## 2019-04-29 NOTE — ED Provider Notes (Signed)
[facility] EMERGENCY DEPARTMENT Provider Note   CSN: [id number] Arrival date & time: 04/29/19  1340     History   Chief Complaint Chief Complaint  Patient presents with  . Abdominal Pain    HPI Brian French. is a 78 y.o. male.     HPI  78yo male with history of DM,htn, hlpd, recent evaluation by me in the ED 4 days ago for right flank pain worsening over one month diagnosed with right pleural effusion, possible right sided pneumonia on abx, incidental 3cm aneurysm for which I recommended follow up (did not feel it was ruptured AAA given duration of symptoms,clinical hx), and flexeril for possible msk pain, presents with 2 days of abdominal pain and continued right sided flank/back pain.    Pain throughout abdomen is diffuse, and continued severe back pain. Abdominal pain feels like a "fullness." Reports he cannot eat or sleep due to the pain.  Not having n/v/diarrhea.  No cough, shortness of breath or chest pain. Does have dyspnea with exertion.  No fevers.  Self caths for urine.    Past Medical History:  Diagnosis Date  . Diabetes mellitus without complication ([place])   . Headache disorder 05/22/2015  . Hyperlipidemia   . Hypertension     Patient Active Problem List   Diagnosis Date Noted  . Vertigo 05/22/2015  . Headache disorder 05/22/2015    Past Surgical History:  Procedure Laterality Date  . right testicular swelling     had I and D after swelling s/p trauma from scrotal  swelling, got hit in the testes during judo [place] Medications    Prior to Admission medications   Medication Sig Start Date End Date Taking? Authorizing Provider  atorvastatin (LIPITOR) 10 MG tablet Take 10 mg by mouth daily.    [provider]  cephALEXin (KEFLEX) 500 MG capsule Take 1 capsule (500 mg total) by mouth 2 (two) times daily for 7 days. 04/25/19 05/02/19  Gareth Morgan, MD  cyclobenzaprine (FLEXERIL) 10 MG tablet Take 1 tablet (10 mg  total) by mouth 3 (three) times daily as needed for muscle spasms. 04/25/19   Gareth Morgan, MD  doxycycline (VIBRAMYCIN) 100 MG capsule Take 1 capsule (100 mg total) by mouth 2 (two) times daily for 7 days. 04/25/19 05/02/19  Gareth Morgan, MD  glipiZIDE (GLUCOTROL) 5 MG tablet Take by mouth 2 (two) times daily before a meal.    [provider]  lidocaine (LIDODERM) 5 % Place 1 patch onto the skin daily. Remove & Discard patch within 12 hours or as directed by MD 04/25/19   Gareth Morgan, MD  lisinopril (PRINIVIL,ZESTRIL) 10 MG tablet Take 10 mg by mouth daily.    [provider]  meloxicam (MOBIC) 15 MG tablet Take 15 mg by mouth daily.    [provider]  metFORMIN (GLUCOPHAGE) 1000 MG tablet Take 1,000 mg by mouth 2 (two) times daily with a meal.    [provider]  naproxen (NAPROSYN) 500 MG tablet Take 1 tablet (500 mg total) by mouth 2 (two) times daily as needed for mild pain or moderate pain. 11/22/15   Forde Dandy, MD  topiramate (TOPAMAX) 25 MG tablet Take one tablet at night for one week, then take 2 tablets at night for one week, then take 3 tablets at night. 05/22/15   Kathrynn Ducking, MD    Family History Family History  Problem Relation Age of Onset  .  Cancer Mother   . Heart disease Father     Social History Social History   Tobacco Use  . Smoking status: Former Smoker    Packs/day: 2.00    Types: Cigarettes    Quit date: 07/15/1997    Years since quitting: 21.8  . Smokeless tobacco: Never Used  Substance Use Topics  . Alcohol use: Yes    Alcohol/week: 0.0 standard drinks    Comment: occasionally  . Drug use: Never     Allergies   Patient has no known allergies.   Review of Systems Review of Systems  Constitutional: Positive for fatigue. Negative for fever.  Respiratory: Positive for shortness of breath (on exertion). Negative for cough.   Cardiovascular: Negative for chest pain.  Gastrointestinal: Positive for  abdominal pain. Negative for diarrhea, nausea and vomiting.  Genitourinary: Positive for flank pain. Difficulty urinating: chronic, self cath.  Musculoskeletal: Positive for back pain.  Skin: Negative for rash.  Neurological: Positive for syncope and light-headedness. Negative for weakness and numbness.     Physical Exam Updated Vital Signs BP 129/83   Pulse (!) 118   Temp 97.6 F (36.4 C) (Oral)   Resp 19   Ht 5\' 11"  (1.803 m)   Wt 105.2 kg   SpO2 93%   BMI 32.36 kg/m   Physical Exam Vitals signs and nursing note reviewed.  Constitutional:      General: He is not in acute distress.    Appearance: He is well-developed. He is not diaphoretic.  HENT:     Head: Normocephalic and atraumatic.  Eyes:     Conjunctiva/sclera: Conjunctivae normal.  Neck:     Musculoskeletal: Normal range of motion.  Cardiovascular:     Rate and Rhythm: Tachycardia present. Rhythm irregularly irregular.  Pulmonary:     Effort: Pulmonary effort is normal. No respiratory distress.     Breath sounds: Normal breath sounds.  Abdominal:     General: There is no distension.     Palpations: Abdomen is soft.     Tenderness: There is generalized abdominal tenderness. There is no guarding.  Skin:    General: Skin is warm and dry.  Neurological:     Mental Status: He is alert and oriented to person, place, and time.      ED Treatments / Results  Labs (all labs ordered are listed, but only abnormal results are displayed) Labs Reviewed  CBC WITH DIFFERENTIAL/PLATELET - Abnormal; Notable for the following components:      Result Value   WBC 14.5 (*)    Neutro Abs 9.6 (*)    Monocytes Absolute 1.8 (*)    Eosinophils Absolute 1.3 (*)    Abs Immature Granulocytes 0.12 (*)    All other components within normal limits  COMPREHENSIVE METABOLIC PANEL - Abnormal; Notable for the following components:   Glucose, Bld 302 (*)    BUN 28 (*)    Creatinine, Ser 1.53 (*)    Albumin 3.3 (*)    AST 10 (*)     GFR calc non Af Amer 43 (*)    GFR calc Af Amer 50 (*)    All other components within normal limits  CBG MONITORING, ED - Abnormal; Notable for the following components:   Glucose-Capillary 288 (*)    All other components within normal limits  SARS CORONAVIRUS 2 BY RT PCR (HOSPITAL ORDER, [facility])  CULTURE, BLOOD (ROUTINE X 2)  CULTURE, BLOOD (ROUTINE X 2)  LIPASE, BLOOD  LACTIC ACID, PLASMA    EKG EKG Interpretation  Date/Time:  Thursday April 29 2019 14:19:26 EDT Ventricular Rate:  128 PR Interval:    QRS Duration: 87 QT Interval:  305 QTC Calculation: 445 R Axis:   19 Text Interpretation:  Atrial fibrillation Ventricular premature complex Low voltage, extremity and precordial leads No previous ECGs available Confirmed by Fredia Sorrow [phone]) on 04/29/2019 4:02:12 PM   Radiology Dg Chest Portable 1 View  Result Date: 04/29/2019 CLINICAL DATA:  Flank pain. EXAM: PORTABLE CHEST 1 VIEW COMPARISON:  Body CT April 25, 2019 FINDINGS: Cardiomediastinal silhouette is normal. Mediastinal contours appear intact. Large airspace opacity in the right cardiophrenic angle. Probable right pleural effusion. Osseous structures are without acute abnormality. Soft tissues are grossly normal. IMPRESSION: 1. Large airspace opacity in the right cardiophrenic angle may represent airspace consolidation or a pulmonary mass. 2. Probable right pleural effusion. Electronically Signed   By: Fidela Salisbury M.D.   On: 04/29/2019 15:22    Procedures Procedures (including critical care time)  Medications Ordered in ED Medications  sodium chloride 0.9 % bolus 500 mL (0 mLs Intravenous Stopped 04/29/19 1605)  morphine 4 MG/ML injection 4 mg (4 mg Intravenous Given 04/29/19 1452)  iohexol (OMNIPAQUE) 350 MG/ML injection 100 mL (100 mLs Intravenous Contrast Given 04/29/19 1519)     Initial Impression / Assessment and Plan / ED Course  I have reviewed the triage  vital signs and the nursing notes.  Pertinent labs & imaging results that were available during my care of the patient were reviewed by me and considered in my medical decision making (see chart for details).        78yo male with history of DM,htn, hlpd, recent evaluation by me in the ED 4 days ago for right flank pain worsening over one month diagnosed with right pleural effusion, possible right sided pneumonia on abx, incidental 3cm aneurysm for which I recommended follow up, and flexeril for possible msk pain, presents with 2 days of abdominal pain and continued right sided flank/back pain.  Patient presents in atrial fibrillation with RVR. While nursing at bedside on arrival, he had a syncopal episode and HR decreased to 20s. I am unable to find this event on telemetry but syncopal event was witnessed by nursing.  Atrial fibrillation with RVR is new, rate fluctuating at this time and will hold on acute rate control as we evaluate for underlying causes and control pain.   DDx for atrial fibrillation with RVR, syncope, abdominal pain includes PE, COVID19 infection, pneumonia, dissection, mesenteric ischemia, renal infarcts or other intraabdominal process. Discussed with radiology clinical presentation and concerns and feel that CT PE study and abdomen pelvis will give [place] the most information given current differential.   Signed out to Dr. Rogene Houston with labwork and imaging pending.  Will send rapid COVID19 test.   Final Clinical Impressions(s) / ED Diagnoses   Final diagnoses:  Atrial fibrillation with RVR ([place])  New onset atrial fibrillation ([facility])  Generalized abdominal pain  Right flank pain  Syncope, unspecified syncope type    ED Discharge Orders    None       Gareth Morgan, MD 04/29/19 1609

## 2019-04-29 NOTE — Progress Notes (Signed)
Pharmacy Antibiotic Note  Brian French. is a 78 y.o. male admitted on 04/29/2019 with a possible right-sided pneumonia and potential pyellonephritis. Pt was previously on doxycycline and keflex but started to worsen. CTA is negative for PE. Pharmacy has been consulted for vancomycin and cefepime dosing.  WBC is elevated at 14.5. Lactate is slightly elevated at 1.6. He is afebrile.   Plan: Vancomycin 2,000 mg IV X 1 as loading dose Vancomycin 1000 mg IV Q 24 hrs. Goal AUC 400-550. Expected AUC: 473.6 SCr used: 1.53 Cefepime 2g IV Q12 hours Continue flagyl per MD Monitor renal function, WBC, temp, and clinical status  Height: 5\' 11"  (180.3 cm) Weight: 232 lb (105.2 kg) IBW/kg (Calculated) : 75.3  Temp (24hrs), Avg:97.6 F (36.4 C), Min:97.6 F (36.4 C), Max:97.6 F (36.4 C)  Recent Labs  Lab 04/25/19 1350 04/29/19 1438  WBC 14.5* 14.5*  CREATININE 1.27* 1.53*  LATICACIDVEN  --  1.6    Estimated Creatinine Clearance: 49.9 mL/min (A) (by C-G formula based on SCr of 1.53 mg/dL (H)).    No Known Allergies  Antimicrobials this admission: Vancomycin 10/15 >>  Cefepime 10/15 >>  Flagyl 10/15 >>   Microbiology results: 10/15 BCx: Sent 10/15 UCx: Sent 10/15 COVID: Sent  Thank you for allowing pharmacy to be a part of this patient's care.  Sherren Kerns, PharmD PGY1 Acute Care Pharmacy Resident 04/29/2019 5:31 PM

## 2019-04-29 NOTE — ED Notes (Signed)
Pt. Reports he cant sleep or rest due to the low back pain.  Pt. Also reports his belly hurts.

## 2019-04-29 NOTE — ED Notes (Signed)
EDP notified of B/P. See new orders.

## 2019-04-29 NOTE — ED Notes (Signed)
Family at bedside. 

## 2019-04-29 NOTE — Care Management (Signed)
  This is a no charge note  I was asked to possibly remove PUI order by our Furniture conservator/restorer. Since pt dose not have fever, lower possibility for Covid19, I removed PUI order. Kandee Keen, MD  [facility]   If 7PM-7AM, please contact night-coverage [email or web address] Password [facility] 04/29/2019, 8:32 PM

## 2019-04-30 ENCOUNTER — Encounter (HOSPITAL_COMMUNITY): Payer: Self-pay | Admitting: Student

## 2019-04-30 ENCOUNTER — Inpatient Hospital Stay (HOSPITAL_COMMUNITY): Payer: Medicare Other

## 2019-04-30 ENCOUNTER — Other Ambulatory Visit: Payer: Self-pay

## 2019-04-30 DIAGNOSIS — E1169 Type 2 diabetes mellitus with other specified complication: Secondary | ICD-10-CM

## 2019-04-30 DIAGNOSIS — J189 Pneumonia, unspecified organism: Secondary | ICD-10-CM | POA: Diagnosis present

## 2019-04-30 DIAGNOSIS — J918 Pleural effusion in other conditions classified elsewhere: Secondary | ICD-10-CM

## 2019-04-30 DIAGNOSIS — N10 Acute tubulo-interstitial nephritis: Secondary | ICD-10-CM | POA: Diagnosis present

## 2019-04-30 DIAGNOSIS — I4891 Unspecified atrial fibrillation: Secondary | ICD-10-CM

## 2019-04-30 DIAGNOSIS — E785 Hyperlipidemia, unspecified: Secondary | ICD-10-CM

## 2019-04-30 DIAGNOSIS — E119 Type 2 diabetes mellitus without complications: Secondary | ICD-10-CM

## 2019-04-30 HISTORY — PX: IR THORACENTESIS ASP PLEURAL SPACE W/IMG GUIDE: IMG5380

## 2019-04-30 LAB — HEPARIN LEVEL (UNFRACTIONATED)
Heparin Unfractionated: 0.21 IU/mL — ABNORMAL LOW (ref 0.30–0.70)
Heparin Unfractionated: 0.24 IU/mL — ABNORMAL LOW (ref 0.30–0.70)

## 2019-04-30 LAB — URINE CULTURE: Culture: NO GROWTH

## 2019-04-30 LAB — BODY FLUID CELL COUNT WITH DIFFERENTIAL
Eos, Fluid: 4 %
Lymphs, Fluid: 48 %
Monocyte-Macrophage-Serous Fluid: 36 % — ABNORMAL LOW (ref 50–90)
Neutrophil Count, Fluid: 12 % (ref 0–25)
Total Nucleated Cell Count, Fluid: 2400 cu mm — ABNORMAL HIGH (ref 0–1000)

## 2019-04-30 LAB — GLUCOSE, CAPILLARY
Glucose-Capillary: 185 mg/dL — ABNORMAL HIGH (ref 70–99)
Glucose-Capillary: 165 mg/dL — ABNORMAL HIGH (ref 70–99)
Glucose-Capillary: 178 mg/dL — ABNORMAL HIGH (ref 70–99)
Glucose-Capillary: 155 mg/dL — ABNORMAL HIGH (ref 70–99)
Glucose-Capillary: 208 mg/dL — ABNORMAL HIGH (ref 70–99)

## 2019-04-30 LAB — BASIC METABOLIC PANEL
Anion gap: 9 (ref 5–15)
BUN: 21 mg/dL (ref 8–23)
CO2: 23 mmol/L (ref 22–32)
Calcium: 8.1 mg/dL — ABNORMAL LOW (ref 8.9–10.3)
Chloride: 105 mmol/L (ref 98–111)
Creatinine, Ser: 1.37 mg/dL — ABNORMAL HIGH (ref 0.61–1.24)
GFR calc Af Amer: 57 mL/min — ABNORMAL LOW (ref >60)
GFR calc non Af Amer: 49 mL/min — ABNORMAL LOW (ref >60)
Glucose, Bld: 156 mg/dL — ABNORMAL HIGH (ref 70–99)
Potassium: 4.1 mmol/L (ref 3.5–5.1)
Sodium: 137 mmol/L (ref 135–145)

## 2019-04-30 LAB — CBC
HCT: 34.8 % — ABNORMAL LOW (ref 39.0–52.0)
Hemoglobin: 11.5 g/dL — ABNORMAL LOW (ref 13.0–17.0)
MCH: 30.1 pg (ref 26.0–34.0)
MCHC: 33 g/dL (ref 30.0–36.0)
MCV: 91.1 fL (ref 80.0–100.0)
Platelets: 163 10*3/uL (ref 150–400)
RBC: 3.82 MIL/uL — ABNORMAL LOW (ref 4.22–5.81)
RDW: 14 % (ref 11.5–15.5)
WBC: 12.8 10*3/uL — ABNORMAL HIGH (ref 4.0–10.5)
nRBC: 0 % (ref 0.0–0.2)

## 2019-04-30 LAB — GRAM STAIN

## 2019-04-30 LAB — ECHOCARDIOGRAM COMPLETE
Height: 71 in
Weight: 3684.8 oz

## 2019-04-30 LAB — HEMOGLOBIN A1C
Hgb A1c MFr Bld: 9.8 % — ABNORMAL HIGH (ref 4.8–5.6)
Mean Plasma Glucose: 234.56 mg/dL

## 2019-04-30 LAB — PH, BODY FLUID: pH, Body Fluid: 7.4

## 2019-04-30 LAB — PROTEIN, PLEURAL OR PERITONEAL FLUID: Total protein, fluid: 4.2 g/dL

## 2019-04-30 LAB — MRSA PCR SCREENING: MRSA by PCR: NEGATIVE

## 2019-04-30 MED ORDER — LIDOCAINE HCL (PF) 1 % IJ SOLN
INTRAMUSCULAR | Status: DC | PRN
  Administered 2019-04-30: 10 mL

## 2019-04-30 MED ORDER — ACETAMINOPHEN 650 MG RE SUPP: 650.0000 mg | Freq: Four times a day (QID) | RECTAL | Status: DC | PRN

## 2019-04-30 MED ORDER — HEPARIN BOLUS VIA INFUSION
1500.0000 [IU] | Freq: Once | INTRAVENOUS | Status: AC
  Administered 2019-04-30: 1500 [IU] via INTRAVENOUS
  Filled 2019-04-30: qty 1500

## 2019-04-30 MED ORDER — HEPARIN BOLUS VIA INFUSION
4000.0000 [IU] | Freq: Once | INTRAVENOUS | Status: AC
  Administered 2019-04-30: 4000 [IU] via INTRAVENOUS
  Filled 2019-04-30: qty 4000

## 2019-04-30 MED ORDER — ONDANSETRON HCL 4 MG/2ML IJ SOLN: 4.0000 mg | Freq: Four times a day (QID) | INTRAMUSCULAR | Status: DC | PRN

## 2019-04-30 MED ORDER — SODIUM CHLORIDE 0.9 % IV SOLN
INTRAVENOUS | Status: DC | PRN
  Administered 2019-04-30: 250 mL via INTRAVENOUS

## 2019-04-30 MED ORDER — ONDANSETRON HCL 4 MG PO TABS: 4.0000 mg | ORAL_TABLET | Freq: Four times a day (QID) | ORAL | Status: DC | PRN

## 2019-04-30 MED ORDER — INSULIN ASPART 100 UNIT/ML ~~LOC~~ SOLN
0.0000 [IU] | Freq: Three times a day (TID) | SUBCUTANEOUS | Status: DC
  Administered 2019-04-30 (×3): 3 [IU] via SUBCUTANEOUS
  Administered 2019-05-01: 5 [IU] via SUBCUTANEOUS
  Administered 2019-05-01: 8 [IU] via SUBCUTANEOUS
  Administered 2019-05-01: 5 [IU] via SUBCUTANEOUS
  Administered 2019-05-02 – 2019-05-03 (×4): 3 [IU] via SUBCUTANEOUS
  Administered 2019-05-03: 5 [IU] via SUBCUTANEOUS

## 2019-04-30 MED ORDER — INSULIN ASPART 100 UNIT/ML ~~LOC~~ SOLN: 0.0000 [IU] | Freq: Three times a day (TID) | SUBCUTANEOUS | Status: DC

## 2019-04-30 MED ORDER — MORPHINE SULFATE (PF) 2 MG/ML IV SOLN
2.0000 mg | INTRAVENOUS | Status: DC | PRN
  Administered 2019-05-02: 2 mg via INTRAVENOUS
  Filled 2019-04-30: qty 1

## 2019-04-30 MED ORDER — LIDOCAINE HCL 1 % IJ SOLN
INTRAMUSCULAR | Status: AC
  Filled 2019-04-30: qty 20

## 2019-04-30 MED ORDER — INSULIN ASPART 100 UNIT/ML ~~LOC~~ SOLN
0.0000 [IU] | Freq: Every day | SUBCUTANEOUS | Status: DC
  Administered 2019-04-30 – 2019-05-02 (×2): 2 [IU] via SUBCUTANEOUS

## 2019-04-30 MED ORDER — ACETAMINOPHEN 325 MG PO TABS
650.0000 mg | ORAL_TABLET | Freq: Four times a day (QID) | ORAL | Status: DC | PRN
  Administered 2019-05-01 – 2019-05-02 (×2): 650 mg via ORAL
  Filled 2019-04-30 (×2): qty 2

## 2019-04-30 MED ORDER — HEPARIN (PORCINE) 25000 UT/250ML-% IV SOLN
2000.0000 [IU]/h | INTRAVENOUS | Status: AC
  Administered 2019-04-30 (×2): 1400 [IU]/h via INTRAVENOUS
  Administered 2019-05-01: 1800 [IU]/h via INTRAVENOUS
  Administered 2019-05-01: 2000 [IU]/h via INTRAVENOUS
  Filled 2019-04-30 (×4): qty 250

## 2019-04-30 NOTE — Plan of Care (Signed)
  Problem: Pain Managment: Goal: General experience of comfort will improve Outcome: Progressing   

## 2019-04-30 NOTE — Progress Notes (Signed)
[facility] for heparin Indication: atrial fibrillation  No Known Allergies  Patient Measurements: Height: 5\' 11"  (180.3 cm) Weight: 230 lb 4.8 oz (104.5 kg) IBW/kg (Calculated) : 75.3 Heparin Dosing Weight: 100kg  Vital Signs: Temp: 98.1 F (36.7 C) (10/16 1706) Temp Source: Oral (10/16 1706) BP: 133/80 (10/16 1706) Pulse Rate: 89 (10/16 1706)  Labs: Recent Labs    04/29/19 1438 04/29/19 1738 04/30/19 0543 04/30/19 1101 04/30/19 2026  HGB 13.7  --  11.5*  --   --   HCT 42.2  --  34.8*  --   --   PLT 174  --  163  --   --   APTT  --  37*  --   --   --   LABPROT  --  14.6  --   --   --   INR  --  1.2  --   --   --   HEPARINUNFRC  --   --   --  0.21* 0.24*  CREATININE 1.53*  --  1.37*  --   --     Estimated Creatinine Clearance: 55.6 mL/min (A) (by C-G formula based on SCr of 1.37 mg/dL (H)).   Medical History: Past Medical History:  Diagnosis Date  . Diabetes mellitus without complication ([place])   . Headache disorder 05/22/2015  . Hyperlipidemia   . Hypertension       Assessment: 78yo male c/o abdominal and back pain x2wk, had been on ABX for PNA +/- UTI, found to be in Afib, to begin heparin.  Heparin level 0.24  Goal of Therapy:  Heparin level 0.3-0.7 units/ml Monitor platelets by anticoagulation protocol: Yes   Plan:  Increase heparin to 1800 units / hr Next heparin level in AM  Thank you Anette Guarneri, PharmD 04/30/2019,8:55 PM

## 2019-04-30 NOTE — Progress Notes (Signed)
Echocardiogram 2D Echocardiogram has been performed.  Brian French M 04/30/2019, 11:25 AM

## 2019-04-30 NOTE — H&P (Signed)
History and Physical    Brian French. KDX:[id number] DOB: 1941/04/26 DOA: 04/29/2019  PCP: [facility]  Patient coming from: Home  I have personally briefly reviewed patient's old medical records in [place]  Chief Complaint: Flank pain, abd pain  HPI: Brian French. is a 78 y.o. male with medical history significant of DM2, HTN, HLD.  Patient chronically self caths Q8H for past couple of years.  Patient seen in ED x4 days ago for right flank pain worsening over one month.  He was diagnosed with small right pleural effusion, possible right sided pneumonia, and an incedental 3cm AAA, and discharged on ABx.  Patient returns to the ED today with persistent R flank pain and now abd pain / back pain despite taking ABx.   ED Course: No fever, WBC 14k, noted to be in new onset A.Fib RVR, though the RVR component seems to improve after 1.5L NS in ED.  CTA chest was neg for PE, but did show RUL PNA and moderate R pleural effusion.  CT abd/pelvis shows B areas of low density throughout both kidneys: infarcts vs pyelonephritis.  Patient put on cefepime + Vanc empirically, transferred to [facility].   Review of Systems: As per HPI, otherwise all review of systems negative.  Past Medical History:  Diagnosis Date   Diabetes mellitus without complication ([place])    Headache disorder 05/22/2015   Hyperlipidemia    Hypertension     Past Surgical History:  Procedure Laterality Date   right testicular swelling     had I and D after swelling s/p trauma from scrotal  swelling, got hit in the testes during judo 1962     reports that he quit smoking about 21 years ago. His smoking use included cigarettes. He smoked 2.00 packs per day. He has never used smokeless tobacco. He reports current alcohol use. He reports that he does not use drugs.  No Known Allergies  Family History  Problem Relation Age of Onset   Cancer Mother    Heart disease Father      Prior to  Admission medications   Medication Sig Start Date End Date Taking? Authorizing Provider  atorvastatin (LIPITOR) 10 MG tablet Take 10 mg by mouth daily.    [provider]  cephALEXin (KEFLEX) 500 MG capsule Take 1 capsule (500 mg total) by mouth 2 (two) times daily for 7 days. 04/25/19 05/02/19  Gareth Morgan, MD  cyclobenzaprine (FLEXERIL) 10 MG tablet Take 1 tablet (10 mg total) by mouth 3 (three) times daily as needed for muscle spasms. 04/25/19   Gareth Morgan, MD  doxycycline (VIBRAMYCIN) 100 MG capsule Take 1 capsule (100 mg total) by mouth 2 (two) times daily for 7 days. 04/25/19 05/02/19  Gareth Morgan, MD  glipiZIDE (GLUCOTROL) 5 MG tablet Take by mouth 2 (two) times daily before a meal.    [provider]  lidocaine (LIDODERM) 5 % Place 1 patch onto the skin daily. Remove & Discard patch within 12 hours or as directed by MD 04/25/19   Gareth Morgan, MD  lisinopril (PRINIVIL,ZESTRIL) 10 MG tablet Take 10 mg by mouth daily.    [provider]  meloxicam (MOBIC) 15 MG tablet Take 15 mg by mouth daily.    [provider]  metFORMIN (GLUCOPHAGE) 1000 MG tablet Take 1,000 mg by mouth 2 (two) times daily with a meal.    [provider]  naproxen (NAPROSYN) 500 MG tablet Take 1 tablet (500 mg total) by mouth  2 (two) times daily as needed for mild pain or moderate pain. 11/22/15   Forde Dandy, MD  topiramate (TOPAMAX) 25 MG tablet Take one tablet at night for one week, then take 2 tablets at night for one week, then take 3 tablets at night. 05/22/15   Kathrynn Ducking, MD    Physical Exam: Vitals:   04/29/19 2130 04/29/19 2330 04/30/19 0000 04/30/19 0102  BP: 121/69 (!) 132/59 (!) 104/53 127/66  Pulse: 92 92 90 94  Resp: 20 18 20 18   Temp:    98.2 F (36.8 C)  TempSrc:      SpO2: 98% 99% 95% 97%  Weight:    104.5 kg  Height:    5\' 11"  (1.803 m)    Constitutional: NAD, calm, comfortable Eyes: PERRL, lids and conjunctivae  normal ENMT: Mucous membranes are moist. Posterior pharynx clear of any exudate or lesions.Normal dentition.  Neck: normal, supple, no masses, no thyromegaly Respiratory: clear to auscultation bilaterally, no wheezing, no crackles. Normal respiratory effort. No accessory muscle use.  Cardiovascular: IRR, IRR Abdomen: no tenderness, no masses palpated. No hepatosplenomegaly. Bowel sounds positive.  Musculoskeletal: no clubbing / cyanosis. No joint deformity upper and lower extremities. Good ROM, no contractures. Normal muscle tone.  Skin: no rashes, lesions, ulcers. No induration Neurologic: CN 2-12 grossly intact. Sensation intact, DTR normal. Strength 5/5 in all 4.  Psychiatric: Normal judgment and insight. Alert and oriented x 3. Normal mood.    Labs on Admission: I have personally reviewed following labs and imaging studies  CBC: Recent Labs  Lab 04/25/19 1350 04/29/19 1438  WBC 14.5* 14.5*  NEUTROABS  --  9.6*  HGB 13.3 13.7  HCT 42.0 42.2  MCV 92.9 91.5  PLT 182 825   Basic Metabolic Panel: Recent Labs  Lab 04/25/19 1350 04/29/19 1438  NA 138 136  K 3.9 4.0  CL 101 101  CO2 25 22  GLUCOSE 204* 302*  BUN 25* 28*  CREATININE 1.27* 1.53*  CALCIUM 9.3 9.0   GFR: Estimated Creatinine Clearance: 49.8 mL/min (A) (by C-G formula based on SCr of 1.53 mg/dL (H)). Liver Function Tests: Recent Labs  Lab 04/25/19 1350 04/29/19 1438  AST 16 10*  ALT 21 17  ALKPHOS 70 70  BILITOT 0.5 0.7  PROT 7.6 7.3  ALBUMIN 3.7 3.3*   Recent Labs  Lab 04/29/19 1438  LIPASE 22   No results for input(s): AMMONIA in the last 168 hours. Coagulation Profile: Recent Labs  Lab 04/29/19 1738  INR 1.2   Cardiac Enzymes: No results for input(s): CKTOTAL, CKMB, CKMBINDEX, TROPONINI in the last 168 hours. BNP (last 3 results) No results for input(s): PROBNP in the last 8760 hours. HbA1C: No results for input(s): HGBA1C in the last 72 hours. CBG: Recent Labs  Lab 04/29/19 1420  04/30/19 0102  GLUCAP 288* 185*   Lipid Profile: No results for input(s): CHOL, HDL, LDLCALC, TRIG, CHOLHDL, LDLDIRECT in the last 72 hours. Thyroid Function Tests: No results for input(s): TSH, T4TOTAL, FREET4, T3FREE, THYROIDAB in the last 72 hours. Anemia Panel: No results for input(s): VITAMINB12, FOLATE, FERRITIN, TIBC, IRON, RETICCTPCT in the last 72 hours. Urine analysis:    Component Value Date/Time   COLORURINE YELLOW 04/29/2019 1728   APPEARANCEUR CLEAR 04/29/2019 1728   LABSPEC 1.010 04/29/2019 1728   PHURINE 6.0 04/29/2019 1728   GLUCOSEU 250 (A) 04/29/2019 1728   HGBUR NEGATIVE 04/29/2019 [place] 04/29/2019 [place] 04/29/2019 1728  PROTEINUR NEGATIVE 04/29/2019 1728   NITRITE NEGATIVE 04/29/2019 1728   LEUKOCYTESUR NEGATIVE 04/29/2019 1728    Radiological Exams on Admission: Ct Angio Chest Pe W And/or Wo Contrast  Result Date: 04/29/2019 CLINICAL DATA:  Acute abdominal and back pain. EXAM: CT ANGIOGRAPHY CHEST CT ABDOMEN AND PELVIS WITH CONTRAST TECHNIQUE: Multidetector CT imaging of the chest was performed using the standard protocol during bolus administration of intravenous contrast. Multiplanar CT image reconstructions and MIPs were obtained to evaluate the vascular anatomy. Multidetector CT imaging of the abdomen and pelvis was performed using the standard protocol during bolus administration of intravenous contrast. CONTRAST:  123mL OMNIPAQUE IOHEXOL 350 MG/ML SOLN COMPARISON:  April 25, 2019. FINDINGS: CTA CHEST FINDINGS Cardiovascular: No evidence of large central pulmonary embolus is noted. However there appears to be non opacification of a branch of the right pulmonary artery in the right middle lobe; is uncertain if this is due to pulmonary embolus or compression from surrounding atelectasis or inflammation. Normal heart size. No pericardial effusion. Atherosclerosis of thoracic aorta is noted without aneurysm formation.  Coronary artery calcifications are noted. Mediastinum/Nodes: 11 mm pretracheal lymph node is noted. 3 cm subcarinal adenopathy is noted. Esophagus is unremarkable. Thyroid gland is unremarkable. Lungs/Pleura: No pneumothorax is noted. Left lung is clear. Moderate right pleural effusion is noted with adjacent subsegmental atelectasis in the right lower lobe. Right middle lobe atelectasis or pneumonia is noted. Air bronchogram is noted. 7 mm nodular density is noted in right middle lobe best seen on image number 53 of series 5. Ill-defined opacity is noted in the right upper lobe most consistent with pneumonia. Musculoskeletal: No chest wall abnormality. No acute or significant osseous findings. Review of the MIP images confirms the above findings. CT ABDOMEN and PELVIS FINDINGS Hepatobiliary: No focal liver abnormality is seen. No gallstones, gallbladder wall thickening, or biliary dilatation. Pancreas: Unremarkable. No pancreatic ductal dilatation or surrounding inflammatory changes. Spleen: Normal in size without focal abnormality. Adrenals/Urinary Tract: 1.6 cm left adrenal nodule is noted. Right adrenal gland is unremarkable. Left renal cyst is noted. Right renal atrophy and cortical scarring is noted. No hydronephrosis or renal obstruction is noted. There are multiple rounded ill-defined low densities involving both kidneys which may represent renal infarction or possibly lobar nephronia. Urinary bladder is mildly distended with trabeculations. Stomach/Bowel: Stomach is within normal limits. Appendix appears normal. No evidence of bowel wall thickening, distention, or inflammatory changes. Vascular/Lymphatic: 3 cm infrarenal abdominal aortic aneurysm is noted. No adenopathy is noted. Reproductive: Prostate is unremarkable. Other: No abdominal wall hernia or abnormality. No abdominopelvic ascites. Musculoskeletal: No acute or significant osseous findings. Review of the MIP images confirms the above findings.  IMPRESSION: There is no definite evidence of large central pulmonary embolus. However, there is noted lack of opacification of the distal portion of a branch of the right pulmonary artery in the right middle lobe; it is uncertain if this is due to pulmonary embolus or compression from adjacent atelectasis or pneumonia. Ill-defined low densities are noted throughout both kidneys, concerning for possible multiple areas of infarction or possibly pyelonephritis. Clinical correlation is recommended. Right renal atrophy and cortical scarring is noted. As noted above, large opacity is seen in right middle lobe concerning for pneumonia or atelectasis. Endobronchial obstruction can not be excluded. Also noted is mediastinal adenopathy which may be inflammatory or neoplastic in origin. Clinical correlation is recommended. Moderate right pleural effusion is noted with adjacent subsegmental atelectasis of the right lower lobe. Probable right upper lobe pneumonia is  noted. 7 mm nodule seen in right middle lobe. Non-contrast chest CT at 6-12 months is recommended. If the nodule is stable at time of repeat CT, then future CT at 18-24 months (from today's scan) is considered optional for low-risk patients, but is recommended for high-risk patients. This recommendation follows the consensus statement: Guidelines for Management of Incidental Pulmonary Nodules Detected on CT Images: From the Fleischner Society 2017; Radiology 2017; 284:228-243. Coronary artery calcifications are noted. 1.6 cm left adrenal nodule is noted. Follow-up CT scan or MRI in 12 months is recommended ensure stability. 3 cm infrarenal abdominal aortic aneurysm. Recommend followup by ultrasound in 3 years. This recommendation follows ACR consensus guidelines: White Paper of the ACR Incidental Findings Committee II on Vascular Findings. J Am Coll Radiol 2013; 10:789-794. Aortic aneurysm NOS (ICD10-I71.9). Aortic Atherosclerosis (ICD10-I70.0). Electronically Signed    By: Marijo Conception M.D.   On: 04/29/2019 16:09   Ct Abdomen Pelvis W Contrast  Result Date: 04/29/2019 CLINICAL DATA:  Acute abdominal and back pain. EXAM: CT ANGIOGRAPHY CHEST CT ABDOMEN AND PELVIS WITH CONTRAST TECHNIQUE: Multidetector CT imaging of the chest was performed using the standard protocol during bolus administration of intravenous contrast. Multiplanar CT image reconstructions and MIPs were obtained to evaluate the vascular anatomy. Multidetector CT imaging of the abdomen and pelvis was performed using the standard protocol during bolus administration of intravenous contrast. CONTRAST:  178mL OMNIPAQUE IOHEXOL 350 MG/ML SOLN COMPARISON:  April 25, 2019. FINDINGS: CTA CHEST FINDINGS Cardiovascular: No evidence of large central pulmonary embolus is noted. However there appears to be non opacification of a branch of the right pulmonary artery in the right middle lobe; is uncertain if this is due to pulmonary embolus or compression from surrounding atelectasis or inflammation. Normal heart size. No pericardial effusion. Atherosclerosis of thoracic aorta is noted without aneurysm formation. Coronary artery calcifications are noted. Mediastinum/Nodes: 11 mm pretracheal lymph node is noted. 3 cm subcarinal adenopathy is noted. Esophagus is unremarkable. Thyroid gland is unremarkable. Lungs/Pleura: No pneumothorax is noted. Left lung is clear. Moderate right pleural effusion is noted with adjacent subsegmental atelectasis in the right lower lobe. Right middle lobe atelectasis or pneumonia is noted. Air bronchogram is noted. 7 mm nodular density is noted in right middle lobe best seen on image number 53 of series 5. Ill-defined opacity is noted in the right upper lobe most consistent with pneumonia. Musculoskeletal: No chest wall abnormality. No acute or significant osseous findings. Review of the MIP images confirms the above findings. CT ABDOMEN and PELVIS FINDINGS Hepatobiliary: No focal liver  abnormality is seen. No gallstones, gallbladder wall thickening, or biliary dilatation. Pancreas: Unremarkable. No pancreatic ductal dilatation or surrounding inflammatory changes. Spleen: Normal in size without focal abnormality. Adrenals/Urinary Tract: 1.6 cm left adrenal nodule is noted. Right adrenal gland is unremarkable. Left renal cyst is noted. Right renal atrophy and cortical scarring is noted. No hydronephrosis or renal obstruction is noted. There are multiple rounded ill-defined low densities involving both kidneys which may represent renal infarction or possibly lobar nephronia. Urinary bladder is mildly distended with trabeculations. Stomach/Bowel: Stomach is within normal limits. Appendix appears normal. No evidence of bowel wall thickening, distention, or inflammatory changes. Vascular/Lymphatic: 3 cm infrarenal abdominal aortic aneurysm is noted. No adenopathy is noted. Reproductive: Prostate is unremarkable. Other: No abdominal wall hernia or abnormality. No abdominopelvic ascites. Musculoskeletal: No acute or significant osseous findings. Review of the MIP images confirms the above findings. IMPRESSION: There is no definite evidence of large central pulmonary  embolus. However, there is noted lack of opacification of the distal portion of a branch of the right pulmonary artery in the right middle lobe; it is uncertain if this is due to pulmonary embolus or compression from adjacent atelectasis or pneumonia. Ill-defined low densities are noted throughout both kidneys, concerning for possible multiple areas of infarction or possibly pyelonephritis. Clinical correlation is recommended. Right renal atrophy and cortical scarring is noted. As noted above, large opacity is seen in right middle lobe concerning for pneumonia or atelectasis. Endobronchial obstruction can not be excluded. Also noted is mediastinal adenopathy which may be inflammatory or neoplastic in origin. Clinical correlation is  recommended. Moderate right pleural effusion is noted with adjacent subsegmental atelectasis of the right lower lobe. Probable right upper lobe pneumonia is noted. 7 mm nodule seen in right middle lobe. Non-contrast chest CT at 6-12 months is recommended. If the nodule is stable at time of repeat CT, then future CT at 18-24 months (from today's scan) is considered optional for low-risk patients, but is recommended for high-risk patients. This recommendation follows the consensus statement: Guidelines for Management of Incidental Pulmonary Nodules Detected on CT Images: From the Fleischner Society 2017; Radiology 2017; 284:228-243. Coronary artery calcifications are noted. 1.6 cm left adrenal nodule is noted. Follow-up CT scan or MRI in 12 months is recommended ensure stability. 3 cm infrarenal abdominal aortic aneurysm. Recommend followup by ultrasound in 3 years. This recommendation follows ACR consensus guidelines: White Paper of the ACR Incidental Findings Committee II on Vascular Findings. J Am Coll Radiol 2013; 10:789-794. Aortic aneurysm NOS (ICD10-I71.9). Aortic Atherosclerosis (ICD10-I70.0). Electronically Signed   By: Marijo Conception M.D.   On: 04/29/2019 16:09   Dg Chest Portable 1 View  Result Date: 04/29/2019 CLINICAL DATA:  Flank pain. EXAM: PORTABLE CHEST 1 VIEW COMPARISON:  Body CT April 25, 2019 FINDINGS: Cardiomediastinal silhouette is normal. Mediastinal contours appear intact. Large airspace opacity in the right cardiophrenic angle. Probable right pleural effusion. Osseous structures are without acute abnormality. Soft tissues are grossly normal. IMPRESSION: 1. Large airspace opacity in the right cardiophrenic angle may represent airspace consolidation or a pulmonary mass. 2. Probable right pleural effusion. Electronically Signed   By: Fidela Salisbury M.D.   On: 04/29/2019 15:22    EKG: Independently reviewed.  Assessment/Plan Principal Problem:   Acute pyelonephritis Active  Problems:   Community acquired pneumonia of right upper lobe of lung   Atrial fibrillation with RVR (HCC)   Parapneumonic effusion   DM2 (diabetes mellitus, type 2) ([place])    1. Pyelonephritis vs renal infarcts - 1. Interestingly his UA is negative both today and x4 days ago. 2. Certianly at risk for UTI / pyelo due to chronic self cathing, and the new onset A.Fib RVR puts him at risk for thromboembolic infarcts 3. UCx, BCx pending 4. Continue empiric cefepime / vanc for now (for this and PNA) 5. Heparin gtt for A.Fib with possible embolic events. 2. RUL CAP and parapneumonic effusion - 1. Failed outpt ABx 2. Cefepime and vanc empirically 3. MRSA PCR nares 4. COVID was negative 5. IR to tap effusion, labs ordered for tap 6. BCx pending 3. A.Fib RVR - 1. Rate improved post IVF, holding off on starting rate meds 2. Tele monitor 3. Heparin gtt per pharm (Chads vasc is going to be at least 2, and concern for possible embolic infarcts of kidneys as above). 4. 2d echo 4. DM2 - 1. Hold home PO meds 2. Mod scale SSI AC/HS  DVT prophylaxis: Heparin per pharm Code Status: Full Family Communication: No family in room Disposition Plan: Home after admit Consults called: None Admission status: Admit to inpatient  Severity of Illness: The appropriate patient status for this patient is INPATIENT. Inpatient status is judged to be reasonable and necessary in order to provide the required intensity of service to ensure the patient's safety. The patient's presenting symptoms, physical exam findings, and initial radiographic and laboratory data in the context of their chronic comorbidities is felt to place them at high risk for further clinical deterioration. Furthermore, it is not anticipated that the patient will be medically stable for discharge from the hospital within 2 midnights of admission. The following factors support the patient status of inpatient.   IP status due to PNA, failed outpt  antibiotics, and then either pyelonephritis failed outpt antibiotics or thromboembolic infarcts of B kidneys due to A.Fib.   * I certify that at the point of admission it is my clinical judgment that the patient will require inpatient hospital care spanning beyond 2 midnights from the point of admission due to high intensity of service, high risk for further deterioration and high frequency of surveillance required.*    Ilyana Manuele M. DO [facility]  How to contact the [facility] Attending or Consulting provider [place] or covering provider during after hours [place], for this patient?  1. Check the care team in [facility] and look for a) attending/consulting [facility] provider listed and b) the [facility] team listed 2. Log into [email or web address]  Amion Physician Scheduling and messaging for groups and whole hospitals  On call and physician scheduling software for group practices, residents, hospitalists and other medical providers for call, clinic, rotation and shift schedules. On[employer] is a hospital-wide system for scheduling doctors and paging doctors on call. EasyPlot is for scientific plotting and data analysis.  [email or web address]  and use [place]'s universal password to access. If you do not have the password, please contact the hospital operator.  3. Locate the [facility] provider you are looking for under [facility] and page to a number that you can be directly reached. 4. If you still have difficulty reaching the provider, please page the [facility] (Director on Call) for the Hospitalists listed on amion for assistance.  04/30/2019, 1:38 AM

## 2019-04-30 NOTE — Progress Notes (Signed)
New Admission Note:   Arrival Method: Arrived from [place] ED via Care link Mental Orientation: Alert and oriented x4 Telemetry: Box #6 Assessment: Completed Skin: See doc flowsheet IV: NSL- Lt AC Pain: Denies Tubes: N/A Safety Measures: Safety Fall Prevention Plan has been discussed.  Admission: Completed 5MW Orientation: Patient has been orientated to the room, unit and staff.  Family: None at bedside  Orders have been reviewed and implemented. Will continue to monitor the patient. Call light has been placed within reach and bed alarm has been activated.   Trajon Rosete [employer], RN-BC Phone number: [phone]

## 2019-04-30 NOTE — Progress Notes (Signed)
Patient admitted for RUL/RML PNA, a fib RVR. pMN admission. See H&P for full details. Let's check EKG. Continue heparin gtt for now. Continue cefepime. Can likely drop vanc. SCr is improving. Says he is feeling better. Thoracentesis today. Continue as per H&P for now.   General: 78 y.o. male resting in bed in NAD Cardiovascular: RRR, +S1, S2, no m/g/r, equal pulses throughout Respiratory: decreased at bases; some rhonchi r side GI: BS+, NDNT, no masses noted, no organomegaly noted MSK: No e/c/c Neuro: Alert to name, follows commands Psyc: Appropriate interaction and affect, calm/cooperative  .Jonnie Finner, DO

## 2019-04-30 NOTE — Procedures (Signed)
PROCEDURE SUMMARY:  Successful US guided diagnostic and therapeutic right thoracentesis. Yielded 900 mL of amber fluid. Pt tolerated procedure well. No immediate complications.  Specimen was sent for labs. CXR ordered.  EBL < 5 mL  Docia Barrier PA-C 04/30/2019 10:07 AM

## 2019-04-30 NOTE — Progress Notes (Signed)
ANTICOAGULATION CONSULT NOTE - Initial Consult  Pharmacy Consult for heparin Indication: atrial fibrillation  No Known Allergies  Patient Measurements: Height: 5\' 11"  (180.3 cm) Weight: 230 lb 4.8 oz (104.5 kg) IBW/kg (Calculated) : 75.3 Heparin Dosing Weight: 100kg  Vital Signs: Temp: 98.2 F (36.8 C) (10/16 0102) Temp Source: Oral (10/15 1353) BP: 127/66 (10/16 0102) Pulse Rate: 94 (10/16 0102)  Labs: Recent Labs    04/29/19 1438 04/29/19 1738  HGB 13.7  --   HCT 42.2  --   PLT 174  --   APTT  --  37*  LABPROT  --  14.6  INR  --  1.2  CREATININE 1.53*  --     Estimated Creatinine Clearance: 49.8 mL/min (A) (by C-G formula based on SCr of 1.53 mg/dL (H)).   Medical History: Past Medical History:  Diagnosis Date  . Diabetes mellitus without complication ([place])   . Headache disorder 05/22/2015  . Hyperlipidemia   . Hypertension     Medications:  Medications Prior to Admission  Medication Sig Dispense Refill Last Dose  . atorvastatin (LIPITOR) 10 MG tablet Take 10 mg by mouth daily.     . cephALEXin (KEFLEX) 500 MG capsule Take 1 capsule (500 mg total) by mouth 2 (two) times daily for 7 days. 14 capsule 0   . cyclobenzaprine (FLEXERIL) 10 MG tablet Take 1 tablet (10 mg total) by mouth 3 (three) times daily as needed for muscle spasms. 20 tablet 0   . doxycycline (VIBRAMYCIN) 100 MG capsule Take 1 capsule (100 mg total) by mouth 2 (two) times daily for 7 days. 14 capsule 0   . glipiZIDE (GLUCOTROL) 5 MG tablet Take by mouth 2 (two) times daily before a meal.     . lidocaine (LIDODERM) 5 % Place 1 patch onto the skin daily. Remove & Discard patch within 12 hours or as directed by MD 30 patch 0   . lisinopril (PRINIVIL,ZESTRIL) 10 MG tablet Take 10 mg by mouth daily.     . meloxicam (MOBIC) 15 MG tablet Take 15 mg by mouth daily.     . metFORMIN (GLUCOPHAGE) 1000 MG tablet Take 1,000 mg by mouth 2 (two) times daily with a meal.     . naproxen (NAPROSYN) 500 MG  tablet Take 1 tablet (500 mg total) by mouth 2 (two) times daily as needed for mild pain or moderate pain. 30 tablet 0   . topiramate (TOPAMAX) 25 MG tablet Take one tablet at night for one week, then take 2 tablets at night for one week, then take 3 tablets at night. 90 tablet 3    Scheduled:  . insulin aspart  0-15 Units Subcutaneous TID WC  . insulin aspart  0-5 Units Subcutaneous QHS  . vancomycin       Infusions:  . ceFEPime (MAXIPIME) IV Stopped (04/29/19 1837)  . vancomycin      Assessment: 78yo male c/o abdominal and back pain x2wk, had been on ABX for PNA +/- UTI, found to be in Afib, to begin heparin.  Goal of Therapy:  Heparin level 0.3-0.7 units/ml Monitor platelets by anticoagulation protocol: Yes   Plan:  Will give heparin 4000 units IV bolus x1 followed by gtt at 1400 units/hr and monitor heparin levels and CBC.  Wynona Neat, PharmD, BCPS  04/30/2019,1:40 AM

## 2019-04-30 NOTE — Progress Notes (Signed)
ANTICOAGULATION CONSULT NOTE - Follow Up Consult  Pharmacy Consult for heparin Indication: atrial fibrillation  No Known Allergies  Patient Measurements: Height: 5\' 11"  (180.3 cm) Weight: 230 lb 4.8 oz (104.5 kg) IBW/kg (Calculated) : 75.3 Heparin Dosing Weight: 104.5  Vital Signs: Temp: 98.2 F (36.8 C) (10/16 1130) Temp Source: Oral (10/16 1130) BP: 123/69 (10/16 1130) Pulse Rate: 81 (10/16 1130)  Labs: Recent Labs    04/29/19 1438 04/29/19 1738 04/30/19 0543 04/30/19 1101  HGB 13.7  --  11.5*  --   HCT 42.2  --  34.8*  --   PLT 174  --  163  --   APTT  --  37*  --   --   LABPROT  --  14.6  --   --   INR  --  1.2  --   --   HEPARINUNFRC  --   --   --  0.21*  CREATININE 1.53*  --  1.37*  --     Estimated Creatinine Clearance: 55.6 mL/min (A) (by C-G formula based on SCr of 1.37 mg/dL (H)).   Assessment:  Pt is a 89 YOM c/o abdominal and back pain x2wk, had been on ABX for PNA +/- UTI, found to be in Afib. Heparin started 10/16  HL 0.21, subtherapeutic.  Hgb 11.5, plt 163 No bleeding noted  Goal of Therapy:  Heparin level 0.3-0.7 units/ml Monitor platelets by anticoagulation protocol: Yes   Plan:  Give heparin bolus 1500 u, then increase rate to 1600 u/hr Check heparin level after 8 hours Daily HL, CBC, s/sx bleeding  Gaylan Gerold 04/30/2019,12:43 PM

## 2019-05-01 LAB — RENAL FUNCTION PANEL
Albumin: 2.7 g/dL — ABNORMAL LOW (ref 3.5–5.0)
Anion gap: 10 (ref 5–15)
BUN: 17 mg/dL (ref 8–23)
CO2: 22 mmol/L (ref 22–32)
Calcium: 8.4 mg/dL — ABNORMAL LOW (ref 8.9–10.3)
Chloride: 104 mmol/L (ref 98–111)
Creatinine, Ser: 1.26 mg/dL — ABNORMAL HIGH (ref 0.61–1.24)
GFR calc Af Amer: >60 mL/min (ref >60)
GFR calc non Af Amer: 55 mL/min — ABNORMAL LOW (ref >60)
Glucose, Bld: 166 mg/dL — ABNORMAL HIGH (ref 70–99)
Phosphorus: 3.3 mg/dL (ref 2.5–4.6)
Potassium: 3.9 mmol/L (ref 3.5–5.1)
Sodium: 136 mmol/L (ref 135–145)

## 2019-05-01 LAB — CBC
HCT: 36.7 % — ABNORMAL LOW (ref 39.0–52.0)
Hemoglobin: 12.1 g/dL — ABNORMAL LOW (ref 13.0–17.0)
MCH: 29.8 pg (ref 26.0–34.0)
MCHC: 33 g/dL (ref 30.0–36.0)
MCV: 90.4 fL (ref 80.0–100.0)
Platelets: 181 10*3/uL (ref 150–400)
RBC: 4.06 MIL/uL — ABNORMAL LOW (ref 4.22–5.81)
RDW: 14 % (ref 11.5–15.5)
WBC: 14.2 10*3/uL — ABNORMAL HIGH (ref 4.0–10.5)
nRBC: 0 % (ref 0.0–0.2)

## 2019-05-01 LAB — GLUCOSE, CAPILLARY
Glucose-Capillary: 216 mg/dL — ABNORMAL HIGH (ref 70–99)
Glucose-Capillary: 299 mg/dL — ABNORMAL HIGH (ref 70–99)
Glucose-Capillary: 219 mg/dL — ABNORMAL HIGH (ref 70–99)
Glucose-Capillary: 195 mg/dL — ABNORMAL HIGH (ref 70–99)

## 2019-05-01 LAB — MAGNESIUM: Magnesium: 1.5 mg/dL — ABNORMAL LOW (ref 1.7–2.4)

## 2019-05-01 LAB — HEPARIN LEVEL (UNFRACTIONATED)
Heparin Unfractionated: 0.22 IU/mL — ABNORMAL LOW (ref 0.30–0.70)
Heparin Unfractionated: 0.53 IU/mL (ref 0.30–0.70)

## 2019-05-01 MED ORDER — INSULIN GLARGINE 100 UNIT/ML ~~LOC~~ SOLN
4.0000 [IU] | Freq: Every day | SUBCUTANEOUS | Status: DC
  Administered 2019-05-01 – 2019-05-02 (×2): 4 [IU] via SUBCUTANEOUS
  Filled 2019-05-01 (×3): qty 0.04

## 2019-05-01 MED ORDER — HALOPERIDOL LACTATE 5 MG/ML IJ SOLN
2.5000 mg | Freq: Four times a day (QID) | INTRAMUSCULAR | Status: DC | PRN
  Administered 2019-05-02: 2.5 mg via INTRAVENOUS
  Filled 2019-05-01: qty 1

## 2019-05-01 MED ORDER — APIXABAN 5 MG PO TABS
5.0000 mg | ORAL_TABLET | Freq: Two times a day (BID) | ORAL | Status: DC
  Administered 2019-05-01 – 2019-05-03 (×5): 5 mg via ORAL
  Filled 2019-05-01 (×5): qty 1

## 2019-05-01 MED ORDER — MAGNESIUM OXIDE 400 (241.3 MG) MG PO TABS
400.0000 mg | ORAL_TABLET | Freq: Two times a day (BID) | ORAL | Status: DC
  Administered 2019-05-01 – 2019-05-03 (×5): 400 mg via ORAL
  Filled 2019-05-01 (×5): qty 1

## 2019-05-01 MED ORDER — DILTIAZEM HCL 60 MG PO TABS
30.0000 mg | ORAL_TABLET | Freq: Four times a day (QID) | ORAL | Status: DC
  Administered 2019-05-01 – 2019-05-03 (×8): 30 mg via ORAL
  Filled 2019-05-01 (×9): qty 1

## 2019-05-01 NOTE — Progress Notes (Signed)
Brian French Kitchen  PROGRESS NOTE    Brian French.  GQQ:[id number] DOB: May 10, 1941 DOA: 04/29/2019 PCP: [facility]   Brief Narrative:   Brian Verde. is a 78 y.o. male with medical history significant of DM2, HTN, HLD.  Patient chronically self caths Q8H for past couple of years. Patient seen in ED x4 days ago for right flank pain worsening over one month.  He was diagnosed with small right pleural effusion, possible right sided pneumonia, and an incedental 3cm AAA, and discharged on ABx. Patient returns to the ED today with persistent R flank pain and now abd pain / back pain despite taking ABx.  10/17: Confused this AM. Says he stopped taking orders when he was left the [employer].    Assessment & Plan:   Principal Problem:   Acute pyelonephritis Active Problems:   Community acquired pneumonia of right upper lobe of lung   Atrial fibrillation with RVR (HCC)   Parapneumonic effusion   DM2 (diabetes mellitus, type 2) (HCC)   CAP RUL/RML Parapneumonic effusion Acute pyelonephritis     - continuing cefepime; stopped vanc     - can likely narrow to lvq or cefdinir in AM  Atrial Fib     - ChadVasc: 4     - rates 80's - 100's     - on heparin gtt; needs oral anticoag; discussed anticoagulation purpose, risks, benefits with wife in great detail; she would like to try eliquis; will start today     - let's start low dose IR dilt as well     - [facility]   DM2     - on SSI     - add lantus 4 units qHS  Hypomagnesemia      - add magOx  Fall     - unwitnessed fall     - no neurological change, head injury     - sitter  DVT prophylaxis: eliquis Code Status: FULL Family Communication: With wife at bedside   Disposition Plan: TBD  Antimicrobials:   Cefepime   ROS:  Denies N, V. Dyspnea improved. Pain with cough. Remainder 10-pt ROS is negative for all not previously mentioned.  Subjective: "They bossed me around in the  [place]."  Objective: Vitals:   04/30/19 1130 04/30/19 1706 04/30/19 2055 05/01/19 0415  BP: 123/69 133/80 136/80 132/64  Pulse: 81 89 90 89  Resp: 15 15 16 16   Temp: 98.2 F (36.8 C) 98.1 F (36.7 C) 98.1 F (36.7 C) 98.4 F (36.9 C)  TempSrc: Oral Oral Oral Oral  SpO2: 96% 96% 95% 93%  Weight:      Height:        Intake/Output Summary (Last 24 hours) at 05/01/2019 0724 Last data filed at 05/01/2019 6712 Gross per 24 hour  Intake 1231.97 ml  Output 4100 ml  Net -2868.03 ml   Filed Weights   04/29/19 1350 04/30/19 0102  Weight: 105.2 kg 104.5 kg    Examination:  General: 78 y.o. male resting in chair  in NAD Cardiovascular: irregular rhythm, +S1, S2, no m/g/r, equal pulses throughout Respiratory: rhonchi on right GI: BS+, NDNT, no masses noted, no organomegaly noted MSK: No e/c/c Neuro: Alert to name, follows commands Psyc: agitated but cooperative   Data Reviewed: I have personally reviewed following labs and imaging studies.  CBC: Recent Labs  Lab 04/25/19 1350 04/29/19 1438 04/30/19 0543 05/01/19 0404  WBC 14.5* 14.5* 12.8* 14.2*  NEUTROABS  --  9.6*  --   --  HGB 13.3 13.7 11.5* 12.1*  HCT 42.0 42.2 34.8* 36.7*  MCV 92.9 91.5 91.1 90.4  PLT 182 174 163 749   Basic Metabolic Panel: Recent Labs  Lab 04/25/19 1350 04/29/19 1438 04/30/19 0543 05/01/19 0404  NA 138 136 137 136  K 3.9 4.0 4.1 3.9  CL 101 101 105 104  CO2 25 22 23 22   GLUCOSE 204* 302* 156* 166*  BUN 25* 28* 21 17  CREATININE 1.27* 1.53* 1.37* 1.26*  CALCIUM 9.3 9.0 8.1* 8.4*  MG  --   --   --  1.5*  PHOS  --   --   --  3.3   GFR: Estimated Creatinine Clearance: 60.4 mL/min (A) (by C-G formula based on SCr of 1.26 mg/dL (H)). Liver Function Tests: Recent Labs  Lab 04/25/19 1350 04/29/19 1438 05/01/19 0404  AST 16 10*  --   ALT 21 17  --   ALKPHOS 70 70  --   BILITOT 0.5 0.7  --   PROT 7.6 7.3  --   ALBUMIN 3.7 3.3* 2.7*   Recent Labs  Lab 04/29/19 1438   LIPASE 22   No results for input(s): AMMONIA in the last 168 hours. Coagulation Profile: Recent Labs  Lab 04/29/19 1738  INR 1.2   Cardiac Enzymes: No results for input(s): CKTOTAL, CKMB, CKMBINDEX, TROPONINI in the last 168 hours. BNP (last 3 results) No results for input(s): PROBNP in the last 8760 hours. HbA1C: Recent Labs    04/30/19 0543  HGBA1C 9.8*   CBG: Recent Labs  Lab 04/30/19 0722 04/30/19 1152 04/30/19 1616 04/30/19 2108 05/01/19 0641  GLUCAP 165* 178* 155* 208* 216*   Lipid Profile: No results for input(s): CHOL, HDL, LDLCALC, TRIG, CHOLHDL, LDLDIRECT in the last 72 hours. Thyroid Function Tests: No results for input(s): TSH, T4TOTAL, FREET4, T3FREE, THYROIDAB in the last 72 hours. Anemia Panel: No results for input(s): VITAMINB12, FOLATE, FERRITIN, TIBC, IRON, RETICCTPCT in the last 72 hours. Sepsis Labs: Recent Labs  Lab 04/29/19 1438 04/29/19 1738  PROCALCITON  --  0.18  LATICACIDVEN 1.6 1.2    Recent Results (from the past 240 hour(s))  Culture, blood (Routine X 2) w Reflex to ID Panel     Status: None (Preliminary result)   Collection Time: 04/29/19  4:20 PM   Specimen: BLOOD LEFT ARM  Result Value Ref Range Status   Specimen Description   Final    BLOOD LEFT ARM Performed at [facility] [address] [place], [place] [address]    Special Requests   Final    BOTTLES DRAWN AEROBIC AND ANAEROBIC Blood Culture adequate volume Performed at [facility], [address] [place], [place] [address]    Culture   Final    NO GROWTH < 24 HOURS Performed at [facility], [place] [address] [place], [place] [address]    Report Status PENDING  Incomplete  SARS CORONAVIRUS 2 (TAT 6-24 HRS) Nasopharyngeal Nasopharyngeal Swab     Status: None   Collection Time: 04/29/19  4:21 PM   Specimen: Nasopharyngeal Swab  Result Value Ref Range Status   SARS Coronavirus 2 NEGATIVE NEGATIVE Final    Comment: (NOTE) SARS-CoV-2 target  nucleic acids are NOT DETECTED. The SARS-CoV-2 RNA is generally detectable in upper and lower respiratory specimens during the acute phase of infection. Negative results do not preclude SARS-CoV-2 infection, do not rule out co-infections with other pathogens, and should not be used as the sole  basis for treatment or other patient management decisions. Negative results must be combined with clinical observations, patient history, and epidemiological information. The expected result is Negative. Fact Sheet for Patients: SugarRoll.be Fact Sheet for Healthcare Providers: https://www.woods-mathews.com/ This test is not yet approved or cleared by the [place] FDA and  has been authorized for detection and/or diagnosis of SARS-CoV-2 by FDA under an Emergency Use Authorization (EUA). This EUA will remain  in effect (meaning this test can be used) for the duration of the COVID-19 declaration under Section 56 4(b)(1) of the Act, 21 U.S.C. section 360bbb-3(b)(1), unless the authorization is terminated or revoked sooner. Performed at [facility], [place] [address], [place], [place] [address]   Culture, blood (Routine X 2) w Reflex to ID Panel     Status: None (Preliminary result)   Collection Time: 04/29/19  4:30 PM   Specimen: BLOOD RIGHT ARM  Result Value Ref Range Status   Specimen Description   Final    BLOOD RIGHT ARM Performed at [facility], [place] [address] [place], [place] [address]    Special Requests   Final    BOTTLES DRAWN AEROBIC AND ANAEROBIC Blood Culture adequate volume Performed at [facility], [address] [place], [place] [address]    Culture   Final    NO GROWTH < 24 HOURS Performed at [facility], [place] [address], [place], [place] [address]    Report Status PENDING  Incomplete  Urine Culture     Status: None   Collection Time: 04/29/19  5:28 PM   Specimen: Urine, Random  Result Value Ref  Range Status   Specimen Description   Final    URINE, RANDOM Performed at [facility], [address] [place], [place] [address]    Special Requests   Final    NONE Performed at [facility], [address] [place], [place] [address]    Culture   Final    NO GROWTH Performed at [facility], [place] [address], [place], [place] [address]    Report Status 04/30/2019 FINAL  Final  MRSA PCR Screening     Status: None   Collection Time: 04/30/19  6:47 AM   Specimen: Nasal Mucosa; Nasopharyngeal  Result Value Ref Range Status   MRSA by PCR NEGATIVE NEGATIVE Final    Comment:        The GeneXpert MRSA Assay (FDA approved for NASAL specimens only), is one component of a comprehensive MRSA colonization surveillance program. It is not intended to diagnose MRSA infection nor to guide or monitor treatment for MRSA infections. Performed at [facility], [place] [address], [place], [place] [address]   Gram stain     Status: None   Collection Time: 04/30/19 10:17 AM   Specimen: PATH Cytology Peritoneal fluid  Result Value Ref Range Status   Specimen Description FLUID PERITONEAL  Final   Special Requests NONE  Final   Gram Stain   Final    FEW WBC PRESENT, PREDOMINANTLY MONONUCLEAR NO ORGANISMS SEEN Performed at [facility], [address] [place], [place] [address]    Report Status 04/30/2019 FINAL  Final      Radiology Studies: Dg Chest 1 View  Result Date: 04/30/2019 CLINICAL DATA:  Status post right thoracentesis. EXAM: CHEST  1 VIEW COMPARISON:  04/29/2019 FINDINGS: Normal heart size. Decreased right pleural effusion. No pneumothorax identified status post thoracentesis.  Opacity along the right heart border is again noted corresponding to right middle lobe consolidation/atelectasis. IMPRESSION: 1. No pneumothorax status post right thoracentesis. 2. Right middle lobe consolidation/atelectasis. 3. No change in aeration to the right lung.  Electronically Signed   By: Kerby Moors M.D.   On: 04/30/2019 10:32   Ct Angio Chest Pe W And/or Wo Contrast  Result Date: 04/29/2019 CLINICAL DATA:  Acute abdominal and back pain. EXAM: CT ANGIOGRAPHY CHEST CT ABDOMEN AND PELVIS WITH CONTRAST TECHNIQUE: Multidetector CT imaging of the chest was performed using the standard protocol during bolus administration of intravenous contrast. Multiplanar CT image reconstructions and MIPs were obtained to evaluate the vascular anatomy. Multidetector CT imaging of the abdomen and pelvis was performed using the standard protocol during bolus administration of intravenous contrast. CONTRAST:  121mL OMNIPAQUE IOHEXOL 350 MG/ML SOLN COMPARISON:  April 25, 2019. FINDINGS: CTA CHEST FINDINGS Cardiovascular: No evidence of large central pulmonary embolus is noted. However there appears to be non opacification of a branch of the right pulmonary artery in the right middle lobe; is uncertain if this is due to pulmonary embolus or compression from surrounding atelectasis or inflammation. Normal heart size. No pericardial effusion. Atherosclerosis of thoracic aorta is noted without aneurysm formation. Coronary artery calcifications are noted. Mediastinum/Nodes: 11 mm pretracheal lymph node is noted. 3 cm subcarinal adenopathy is noted. Esophagus is unremarkable. Thyroid gland is unremarkable. Lungs/Pleura: No pneumothorax is noted. Left lung is clear. Moderate right pleural effusion is noted with adjacent subsegmental atelectasis in the right lower lobe. Right middle lobe atelectasis or pneumonia is noted. Air bronchogram is noted. 7 mm nodular density is noted in right middle lobe best seen on image number 53 of series 5. Ill-defined opacity is noted in the right upper lobe most consistent with pneumonia. Musculoskeletal: No chest wall abnormality. No acute or significant osseous findings. Review of the MIP images confirms the above findings. CT ABDOMEN and PELVIS FINDINGS  Hepatobiliary: No focal liver abnormality is seen. No gallstones, gallbladder wall thickening, or biliary dilatation. Pancreas: Unremarkable. No pancreatic ductal dilatation or surrounding inflammatory changes. Spleen: Normal in size without focal abnormality. Adrenals/Urinary Tract: 1.6 cm left adrenal nodule is noted. Right adrenal gland is unremarkable. Left renal cyst is noted. Right renal atrophy and cortical scarring is noted. No hydronephrosis or renal obstruction is noted. There are multiple rounded ill-defined low densities involving both kidneys which may represent renal infarction or possibly lobar nephronia. Urinary bladder is mildly distended with trabeculations. Stomach/Bowel: Stomach is within normal limits. Appendix appears normal. No evidence of bowel wall thickening, distention, or inflammatory changes. Vascular/Lymphatic: 3 cm infrarenal abdominal aortic aneurysm is noted. No adenopathy is noted. Reproductive: Prostate is unremarkable. Other: No abdominal wall hernia or abnormality. No abdominopelvic ascites. Musculoskeletal: No acute or significant osseous findings. Review of the MIP images confirms the above findings. IMPRESSION: There is no definite evidence of large central pulmonary embolus. However, there is noted lack of opacification of the distal portion of a branch of the right pulmonary artery in the right middle lobe; it is uncertain if this is due to pulmonary embolus or compression from adjacent atelectasis or pneumonia. Ill-defined low densities are noted throughout both kidneys, concerning for possible multiple areas of infarction or possibly pyelonephritis. Clinical correlation is recommended. Right renal atrophy and cortical scarring is noted. As noted above, large opacity is seen in right middle lobe concerning for pneumonia or atelectasis. Endobronchial obstruction can not be excluded. Also noted is mediastinal adenopathy which may be  inflammatory or neoplastic in origin.  Clinical correlation is recommended. Moderate right pleural effusion is noted with adjacent subsegmental atelectasis of the right lower lobe. Probable right upper lobe pneumonia is noted. 7 mm nodule seen in right middle lobe. Non-contrast chest CT at 6-12 months is recommended. If the nodule is stable at time of repeat CT, then future CT at 18-24 months (from today's scan) is considered optional for low-risk patients, but is recommended for high-risk patients. This recommendation follows the consensus statement: Guidelines for Management of Incidental Pulmonary Nodules Detected on CT Images: From the Fleischner Society 2017; Radiology 2017; 284:228-243. Coronary artery calcifications are noted. 1.6 cm left adrenal nodule is noted. Follow-up CT scan or MRI in 12 months is recommended ensure stability. 3 cm infrarenal abdominal aortic aneurysm. Recommend followup by ultrasound in 3 years. This recommendation follows ACR consensus guidelines: White Paper of the ACR Incidental Findings Committee II on Vascular Findings. J Am Coll Radiol 2013; 10:789-794. Aortic aneurysm NOS (ICD10-I71.9). Aortic Atherosclerosis (ICD10-I70.0). Electronically Signed   By: Marijo Conception M.D.   On: 04/29/2019 16:09   Ct Abdomen Pelvis W Contrast  Result Date: 04/29/2019 CLINICAL DATA:  Acute abdominal and back pain. EXAM: CT ANGIOGRAPHY CHEST CT ABDOMEN AND PELVIS WITH CONTRAST TECHNIQUE: Multidetector CT imaging of the chest was performed using the standard protocol during bolus administration of intravenous contrast. Multiplanar CT image reconstructions and MIPs were obtained to evaluate the vascular anatomy. Multidetector CT imaging of the abdomen and pelvis was performed using the standard protocol during bolus administration of intravenous contrast. CONTRAST:  111mL OMNIPAQUE IOHEXOL 350 MG/ML SOLN COMPARISON:  April 25, 2019. FINDINGS: CTA CHEST FINDINGS Cardiovascular: No evidence of large central pulmonary embolus is  noted. However there appears to be non opacification of a branch of the right pulmonary artery in the right middle lobe; is uncertain if this is due to pulmonary embolus or compression from surrounding atelectasis or inflammation. Normal heart size. No pericardial effusion. Atherosclerosis of thoracic aorta is noted without aneurysm formation. Coronary artery calcifications are noted. Mediastinum/Nodes: 11 mm pretracheal lymph node is noted. 3 cm subcarinal adenopathy is noted. Esophagus is unremarkable. Thyroid gland is unremarkable. Lungs/Pleura: No pneumothorax is noted. Left lung is clear. Moderate right pleural effusion is noted with adjacent subsegmental atelectasis in the right lower lobe. Right middle lobe atelectasis or pneumonia is noted. Air bronchogram is noted. 7 mm nodular density is noted in right middle lobe best seen on image number 53 of series 5. Ill-defined opacity is noted in the right upper lobe most consistent with pneumonia. Musculoskeletal: No chest wall abnormality. No acute or significant osseous findings. Review of the MIP images confirms the above findings. CT ABDOMEN and PELVIS FINDINGS Hepatobiliary: No focal liver abnormality is seen. No gallstones, gallbladder wall thickening, or biliary dilatation. Pancreas: Unremarkable. No pancreatic ductal dilatation or surrounding inflammatory changes. Spleen: Normal in size without focal abnormality. Adrenals/Urinary Tract: 1.6 cm left adrenal nodule is noted. Right adrenal gland is unremarkable. Left renal cyst is noted. Right renal atrophy and cortical scarring is noted. No hydronephrosis or renal obstruction is noted. There are multiple rounded ill-defined low densities involving both kidneys which may represent renal infarction or possibly lobar nephronia. Urinary bladder is mildly distended with trabeculations. Stomach/Bowel: Stomach is within normal limits. Appendix appears normal. No evidence of bowel wall thickening, distention, or  inflammatory changes. Vascular/Lymphatic: 3 cm infrarenal abdominal aortic aneurysm is noted. No adenopathy is noted. Reproductive: Prostate is unremarkable. Other: No abdominal wall hernia  or abnormality. No abdominopelvic ascites. Musculoskeletal: No acute or significant osseous findings. Review of the MIP images confirms the above findings. IMPRESSION: There is no definite evidence of large central pulmonary embolus. However, there is noted lack of opacification of the distal portion of a branch of the right pulmonary artery in the right middle lobe; it is uncertain if this is due to pulmonary embolus or compression from adjacent atelectasis or pneumonia. Ill-defined low densities are noted throughout both kidneys, concerning for possible multiple areas of infarction or possibly pyelonephritis. Clinical correlation is recommended. Right renal atrophy and cortical scarring is noted. As noted above, large opacity is seen in right middle lobe concerning for pneumonia or atelectasis. Endobronchial obstruction can not be excluded. Also noted is mediastinal adenopathy which may be inflammatory or neoplastic in origin. Clinical correlation is recommended. Moderate right pleural effusion is noted with adjacent subsegmental atelectasis of the right lower lobe. Probable right upper lobe pneumonia is noted. 7 mm nodule seen in right middle lobe. Non-contrast chest CT at 6-12 months is recommended. If the nodule is stable at time of repeat CT, then future CT at 18-24 months (from today's scan) is considered optional for low-risk patients, but is recommended for high-risk patients. This recommendation follows the consensus statement: Guidelines for Management of Incidental Pulmonary Nodules Detected on CT Images: From the Fleischner Society 2017; Radiology 2017; 284:228-243. Coronary artery calcifications are noted. 1.6 cm left adrenal nodule is noted. Follow-up CT scan or MRI in 12 months is recommended ensure stability. 3  cm infrarenal abdominal aortic aneurysm. Recommend followup by ultrasound in 3 years. This recommendation follows ACR consensus guidelines: White Paper of the ACR Incidental Findings Committee II on Vascular Findings. J Am Coll Radiol 2013; 10:789-794. Aortic aneurysm NOS (ICD10-I71.9). Aortic Atherosclerosis (ICD10-I70.0). Electronically Signed   By: Marijo Conception M.D.   On: 04/29/2019 16:09   Dg Chest Portable 1 View  Result Date: 04/29/2019 CLINICAL DATA:  Flank pain. EXAM: PORTABLE CHEST 1 VIEW COMPARISON:  Body CT April 25, 2019 FINDINGS: Cardiomediastinal silhouette is normal. Mediastinal contours appear intact. Large airspace opacity in the right cardiophrenic angle. Probable right pleural effusion. Osseous structures are without acute abnormality. Soft tissues are grossly normal. IMPRESSION: 1. Large airspace opacity in the right cardiophrenic angle may represent airspace consolidation or a pulmonary mass. 2. Probable right pleural effusion. Electronically Signed   By: Fidela Salisbury M.D.   On: 04/29/2019 15:22   Ir Thoracentesis Asp Pleural Space W/img Guide  Result Date: 04/30/2019 INDICATION: Patient with shortness of breath, right pleural effusion. Request is made for diagnostic and therapeutic thoracentesis. EXAM: ULTRASOUND GUIDED DIAGNOSTIC AND THERAPEUTIC RIGHT THORACENTESIS MEDICATIONS: 10 mL 1% lidocaine COMPLICATIONS: None immediate. PROCEDURE: An ultrasound guided thoracentesis was thoroughly discussed with the patient and questions answered. The benefits, risks, alternatives and complications were also discussed. The patient understands and wishes to proceed with the procedure. Written consent was obtained. Ultrasound was performed to localize and mark an adequate pocket of fluid in the right chest. The area was then prepped and draped in the normal sterile fashion. 1% Lidocaine was used for local anesthesia. Under ultrasound guidance a 6 Fr Safe-T-Centesis catheter was  introduced. Thoracentesis was performed. The catheter was removed and a dressing applied. FINDINGS: A total of approximately 900 mL of amber fluid was removed. Samples were sent to the laboratory as requested by the clinical team. IMPRESSION: Successful ultrasound guided diagnostic and therapeutic right thoracentesis yielding 900 mL of pleural fluid. Read by: Brynda Greathouse  PA-C Electronically Signed   By: Corrie Mckusick D.O.   On: 04/30/2019 14:04     Scheduled Meds:  insulin aspart  0-15 Units Subcutaneous TID WC   insulin aspart  0-5 Units Subcutaneous QHS   Continuous Infusions:  sodium chloride 250 mL (04/30/19 0643)   ceFEPime (MAXIPIME) IV Stopped (05/01/19 0703)   heparin 2,000 Units/hr (05/01/19 2527)   vancomycin Stopped (04/30/19 2019)     LOS: 2 days    Time spent: 35 minutes spent in the coordination of care including 50% of time spent counseling on a fib treatment options.    Jonnie Finner, DO [facility] Pager [phone]  If 7PM-7AM, please contact night-coverage [email or web address] Password [email or web address] 05/01/2019, 7:24 AM

## 2019-05-01 NOTE — Progress Notes (Signed)
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

## 2019-05-01 NOTE — Discharge Instructions (Signed)

## 2019-05-01 NOTE — Progress Notes (Signed)
Patient was sitting in recliner, when he tried to get up and tripped over the bed side table and fell to his knees. Patient said he was going to meet his wife downstairs. The fall was unwitnessed but patient denied hitting his head. There is a small abrasion to his left knee.  MD notified, verbal orders for tele-sitter.  Wife notified, she will be here shortly to sit with the patient.  Patient is now in a low bed with bed alarm activated. Agrees to call for assistance.  Will continue to monitor

## 2019-05-01 NOTE — Progress Notes (Signed)
ANTICOAGULATION CONSULT NOTE  Pharmacy Consult for heparin Indication: atrial fibrillation  No Known Allergies  Patient Measurements: Height: 5\' 11"  (180.3 cm) Weight: 230 lb 4.8 oz (104.5 kg) IBW/kg (Calculated) : 75.3 Heparin Dosing Weight: 100kg  Vital Signs: Temp: 98.3 F (36.8 C) (10/17 0824) Temp Source: Oral (10/17 0824) BP: 127/88 (10/17 0931) Pulse Rate: 104 (10/17 0931)  Labs: Recent Labs    04/29/19 1438 04/29/19 1738 04/30/19 0543  04/30/19 2026 05/01/19 0404 05/01/19 1333  HGB 13.7  --  11.5*  --   --  12.1*  --   HCT 42.2  --  34.8*  --   --  36.7*  --   PLT 174  --  163  --   --  181  --   APTT  --  37*  --   --   --   --   --   LABPROT  --  14.6  --   --   --   --   --   INR  --  1.2  --   --   --   --   --   HEPARINUNFRC  --   --   --    < > 0.24* 0.22* 0.53  CREATININE 1.53*  --  1.37*  --   --  1.26*  --    < > = values in this interval not displayed.    Estimated Creatinine Clearance: 60.4 mL/min (A) (by C-G formula based on SCr of 1.26 mg/dL (H)).   Medical History: Past Medical History:  Diagnosis Date  . Diabetes mellitus without complication ([place])   . Headache disorder 05/22/2015  . Hyperlipidemia   . Hypertension       Assessment: 78yo male c/o abdominal and back pain x2wk, had been on ABX for PNA +/- UTI, found to be in Afib, to begin heparin.  Heparin level 0.53  Goal of Therapy:  Heparin level 0.3-0.7 units/ml Monitor platelets by anticoagulation protocol: Yes   Plan:  D/w MD, Will change patient to apixaban dosing for afib and d/c heparin Apixaban 5mg  PO BID, d/c heparin drip once first dose apixaban is given.   Kalani Baray A. Levada Dy, PharmD, BCPS, FNKF Clinical Pharmacist [place] Please utilize Amion for appropriate phone number to reach the unit pharmacist ([place])   05/01/2019,2:31 PM

## 2019-05-01 NOTE — Progress Notes (Signed)
Late Entry   Patient became agitated, asking where his wife was and wanting to leave. Attempted to reorient patient. Wife came to the bedside, patient is much calmer with her here. MD paged for PRN for agitation/ sleep.

## 2019-05-01 NOTE — Progress Notes (Signed)
ANTICOAGULATION CONSULT NOTE - Follow Up Consult  Pharmacy Consult for heparin Indication: atrial fibrillation  Labs: Recent Labs    04/29/19 1438 04/29/19 1738 04/30/19 0543 04/30/19 1101 04/30/19 2026 05/01/19 0404  HGB 13.7  --  11.5*  --   --  12.1*  HCT 42.2  --  34.8*  --   --  36.7*  PLT 174  --  163  --   --  181  APTT  --  37*  --   --   --   --   LABPROT  --  14.6  --   --   --   --   INR  --  1.2  --   --   --   --   HEPARINUNFRC  --   --   --  0.21* 0.24* 0.22*  CREATININE 1.53*  --  1.37*  --   --   --     Assessment: 78yo male subtherapeutic on heparin with lower heparin level despite rate increase; no gtt issues or signs of bleeding per RN.  Goal of Therapy:  Heparin level 0.3-0.7 units/ml   Plan:  Will increase heparin gtt by 2 units/kg/hr to 2000 units/hr and check level in 8 hours.    Wynona Neat, PharmD, BCPS  05/01/2019,5:54 AM

## 2019-05-02 LAB — RENAL FUNCTION PANEL
Albumin: 2.5 g/dL — ABNORMAL LOW (ref 3.5–5.0)
Anion gap: 12 (ref 5–15)
BUN: 17 mg/dL (ref 8–23)
CO2: 21 mmol/L — ABNORMAL LOW (ref 22–32)
Calcium: 8.5 mg/dL — ABNORMAL LOW (ref 8.9–10.3)
Chloride: 104 mmol/L (ref 98–111)
Creatinine, Ser: 1.26 mg/dL — ABNORMAL HIGH (ref 0.61–1.24)
GFR calc Af Amer: >60 mL/min (ref >60)
GFR calc non Af Amer: 55 mL/min — ABNORMAL LOW (ref >60)
Glucose, Bld: 183 mg/dL — ABNORMAL HIGH (ref 70–99)
Phosphorus: 3.4 mg/dL (ref 2.5–4.6)
Potassium: 3.8 mmol/L (ref 3.5–5.1)
Sodium: 137 mmol/L (ref 135–145)

## 2019-05-02 LAB — GLUCOSE, CAPILLARY
Glucose-Capillary: 183 mg/dL — ABNORMAL HIGH (ref 70–99)
Glucose-Capillary: 198 mg/dL — ABNORMAL HIGH (ref 70–99)
Glucose-Capillary: 164 mg/dL — ABNORMAL HIGH (ref 70–99)
Glucose-Capillary: 206 mg/dL — ABNORMAL HIGH (ref 70–99)

## 2019-05-02 LAB — CBC
HCT: 36.3 % — ABNORMAL LOW (ref 39.0–52.0)
Hemoglobin: 12.1 g/dL — ABNORMAL LOW (ref 13.0–17.0)
MCH: 30 pg (ref 26.0–34.0)
MCHC: 33.3 g/dL (ref 30.0–36.0)
MCV: 90.1 fL (ref 80.0–100.0)
Platelets: 186 10*3/uL (ref 150–400)
RBC: 4.03 MIL/uL — ABNORMAL LOW (ref 4.22–5.81)
RDW: 13.8 % (ref 11.5–15.5)
WBC: 14.5 10*3/uL — ABNORMAL HIGH (ref 4.0–10.5)
nRBC: 0 % (ref 0.0–0.2)

## 2019-05-02 LAB — MAGNESIUM: Magnesium: 1.7 mg/dL (ref 1.7–2.4)

## 2019-05-02 MED ORDER — OMEGA-3-ACID ETHYL ESTERS 1 G PO CAPS
1000.0000 mg | ORAL_CAPSULE | Freq: Two times a day (BID) | ORAL | Status: DC
  Administered 2019-05-02 – 2019-05-03 (×3): 1000 mg via ORAL
  Filled 2019-05-02 (×3): qty 1

## 2019-05-02 MED ORDER — TAMSULOSIN HCL 0.4 MG PO CAPS
0.4000 mg | ORAL_CAPSULE | Freq: Every day | ORAL | Status: DC
  Administered 2019-05-02 – 2019-05-03 (×2): 0.4 mg via ORAL
  Filled 2019-05-02: qty 1

## 2019-05-02 MED ORDER — MEMANTINE HCL 10 MG PO TABS
10.0000 mg | ORAL_TABLET | Freq: Every day | ORAL | Status: DC
  Administered 2019-05-02: 10 mg via ORAL
  Filled 2019-05-02 (×2): qty 1

## 2019-05-02 MED ORDER — ASPIRIN EC 81 MG PO TBEC
81.0000 mg | DELAYED_RELEASE_TABLET | Freq: Every day | ORAL | Status: DC
  Administered 2019-05-02 – 2019-05-03 (×2): 81 mg via ORAL
  Filled 2019-05-02 (×2): qty 1

## 2019-05-02 MED ORDER — MELATONIN 3 MG PO TABS
6.0000 mg | ORAL_TABLET | Freq: Every day | ORAL | Status: DC
  Administered 2019-05-02: 6 mg via ORAL
  Filled 2019-05-02 (×2): qty 2

## 2019-05-02 MED ORDER — ATORVASTATIN CALCIUM 80 MG PO TABS
80.0000 mg | ORAL_TABLET | Freq: Every day | ORAL | Status: DC
  Administered 2019-05-02: 80 mg via ORAL
  Filled 2019-05-02: qty 1

## 2019-05-02 MED ORDER — SODIUM CHLORIDE 0.9 % IV SOLN
2.0000 g | Freq: Three times a day (TID) | INTRAVENOUS | Status: DC
  Filled 2019-05-02 (×2): qty 2

## 2019-05-02 MED ORDER — ADULT MULTIVITAMIN W/MINERALS CH
1.0000 | ORAL_TABLET | Freq: Every day | ORAL | Status: DC
  Administered 2019-05-02 – 2019-05-03 (×2): 1 via ORAL
  Filled 2019-05-02 (×2): qty 1

## 2019-05-02 MED ORDER — CEFDINIR 300 MG PO CAPS
300.0000 mg | ORAL_CAPSULE | Freq: Two times a day (BID) | ORAL | Status: DC
  Administered 2019-05-02 – 2019-05-03 (×2): 300 mg via ORAL
  Filled 2019-05-02 (×3): qty 1

## 2019-05-02 MED ORDER — CYCLOBENZAPRINE HCL 10 MG PO TABS
10.0000 mg | ORAL_TABLET | Freq: Three times a day (TID) | ORAL | Status: DC | PRN
  Administered 2019-05-02 – 2019-05-03 (×2): 10 mg via ORAL
  Filled 2019-05-02 (×2): qty 1

## 2019-05-02 NOTE — Progress Notes (Signed)
Patient pleasantly confused and picking on IV tubing and getting out of bed. Patient's wife reported that he has been  Complaining about right shoulder pain, PRN medication administered as ordered. We'll continue to monitor.

## 2019-05-02 NOTE — Progress Notes (Signed)
Brian French [facility]  PROGRESS NOTE    Brian French.  OZD:[id number] DOB: 05-25-1941 DOA: 04/29/2019 PCP: [facility]   Brief Narrative:   Brian Frenchis a 78 y.o.malewith medical history significant ofDM2, HTN, HLD. Patient chronically self caths Q8H for past couple of years. Patient seen in ED x4 days ago forright flank pain worsening over one month. He was diagnosed withsmallright pleural effusion, possible right sided pneumonia, and an incedental 3cm AAA, and discharged on ABx. Patient returns to the ED today with persistent R flank pain and now abd pain / back pain despite taking ABx.  10/17: Confused this AM. Says he stopped taking orders when he was left the [employer].  10/18: In better spirits this AM. Says he's eager to go home.   Assessment & Plan:   Principal Problem:   Acute pyelonephritis Active Problems:   Community acquired pneumonia of right upper lobe of lung   Atrial fibrillation with RVR (HCC)   Parapneumonic effusion   DM2 (diabetes mellitus, type 2) (HCC)  CAP RUL/RML Parapneumonic effusion Acute pyelonephritis     - cefepime; stopped vanc     - 10/18: will transition to cefdinir; total 10-day course abx  Atrial Fib     - ChadVasc: 4     - rates 80's - 100's     - on heparin gtt; needs oral anticoag; discussed anticoagulation purpose, risks, benefits with wife in great detail; she would like to try eliquis; will start today     - let's start low dose IR dilt as well     - will need follow up with [facility] cardiology     - 10/18: tolerating IR dilt and eliquis; will transition to CR dilt in AM   DM2     - on SSI     - lantus 4 units qHS     - glucose acceptable  Hypomagnesemia      - continue magOx  Fall     - unwitnessed fall     - no neurological change, head injury     - sitter  DVT prophylaxis: eliquis Code Status: FULL Family Communication: With wife on phone Disposition Plan: TBD  Antimicrobials:   Cefdinir  ROS:   Denies N, V, CP, dyspnea. Remainder 10-pt ROS is negative for all not previously mentioned.  Subjective: "I hope I get leave soon."  Objective: Vitals:   05/01/19 1805 05/01/19 2110 05/02/19 0604 05/02/19 0852  BP: 113/73 138/73 136/67 123/71  Pulse: (!) 56 (!) 105 82 85  Resp: 18 16 16 18   Temp: (!) 97.4 F (36.3 C) (!) 97.5 F (36.4 C) 98.6 F (37 C) 98 F (36.7 C)  TempSrc: Oral Oral Oral Oral  SpO2: 96% 95% 94% 94%  Weight:      Height:        Intake/Output Summary (Last 24 hours) at 05/02/2019 1355 Last data filed at 05/02/2019 1300 Gross per 24 hour  Intake 840 ml  Output 750 ml  Net 90 ml   Filed Weights   04/29/19 1350 04/30/19 0102  Weight: 105.2 kg 104.5 kg    Examination:  General: 78 y.o. male resting in bed in NAD Cardiovascular: irregular rhythm, +S1, S2, no m/g/r, equal pulses throughout Respiratory: slight rhonchi on right, no wheeze GI: BS+, NDNT, no masses noted MSK: No e/c/c Neuro: Alert to name, follows commands Psyc: calm/cooperative   Data Reviewed: I have personally reviewed following labs and imaging studies.  CBC: Recent Labs  Lab 04/29/19  1438 04/30/19 0543 05/01/19 0404 05/02/19 0409  WBC 14.5* 12.8* 14.2* 14.5*  NEUTROABS 9.6*  --   --   --   HGB 13.7 11.5* 12.1* 12.1*  HCT 42.2 34.8* 36.7* 36.3*  MCV 91.5 91.1 90.4 90.1  PLT 174 163 181 885   Basic Metabolic Panel: Recent Labs  Lab 04/29/19 1438 04/30/19 0543 05/01/19 0404 05/02/19 0409  NA 136 137 136 137  K 4.0 4.1 3.9 3.8  CL 101 105 104 104  CO2 22 23 22  21*  GLUCOSE 302* 156* 166* 183*  BUN 28* 21 17 17   CREATININE 1.53* 1.37* 1.26* 1.26*  CALCIUM 9.0 8.1* 8.4* 8.5*  MG  --   --  1.5* 1.7  PHOS  --   --  3.3 3.4   GFR: Estimated Creatinine Clearance: 60.4 mL/min (A) (by C-G formula based on SCr of 1.26 mg/dL (H)). Liver Function Tests: Recent Labs  Lab 04/29/19 1438 05/01/19 0404 05/02/19 0409  AST 10*  --   --   ALT 17  --   --   ALKPHOS 70   --   --   BILITOT 0.7  --   --   PROT 7.3  --   --   ALBUMIN 3.3* 2.7* 2.5*   Recent Labs  Lab 04/29/19 1438  LIPASE 22   No results for input(s): AMMONIA in the last 168 hours. Coagulation Profile: Recent Labs  Lab 04/29/19 1738  INR 1.2   Cardiac Enzymes: No results for input(s): CKTOTAL, CKMB, CKMBINDEX, TROPONINI in the last 168 hours. BNP (last 3 results) No results for input(s): PROBNP in the last 8760 hours. HbA1C: Recent Labs    04/30/19 0543  HGBA1C 9.8*   CBG: Recent Labs  Lab 05/01/19 1146 05/01/19 1618 05/01/19 2111 05/02/19 0700 05/02/19 1117  GLUCAP 299* 219* 195* 183* 198*   Lipid Profile: No results for input(s): CHOL, HDL, LDLCALC, TRIG, CHOLHDL, LDLDIRECT in the last 72 hours. Thyroid Function Tests: No results for input(s): TSH, T4TOTAL, FREET4, T3FREE, THYROIDAB in the last 72 hours. Anemia Panel: No results for input(s): VITAMINB12, FOLATE, FERRITIN, TIBC, IRON, RETICCTPCT in the last 72 hours. Sepsis Labs: Recent Labs  Lab 04/29/19 1438 04/29/19 1738  PROCALCITON  --  0.18  LATICACIDVEN 1.6 1.2    Recent Results (from the past 240 hour(s))  Culture, blood (Routine X 2) w Reflex to ID Panel     Status: None (Preliminary result)   Collection Time: 04/29/19  4:20 PM   Specimen: BLOOD LEFT ARM  Result Value Ref Range Status   Specimen Description   Final    BLOOD LEFT ARM Performed at [facility] [address] [place], [place] [address]    Special Requests   Final    BOTTLES DRAWN AEROBIC AND ANAEROBIC Blood Culture adequate volume Performed at [facility], [address] [place], [place] [address]    Culture   Final    NO GROWTH 3 DAYS Performed at [facility], [place] [address] [place], [place] [address]    Report Status PENDING  Incomplete  SARS CORONAVIRUS 2 (TAT 6-24 HRS) Nasopharyngeal Nasopharyngeal Swab     Status: None   Collection Time: 04/29/19  4:21 PM   Specimen: Nasopharyngeal Swab   Result Value Ref Range Status   SARS Coronavirus 2 NEGATIVE NEGATIVE Final    Comment: (NOTE) SARS-CoV-2 target nucleic acids are NOT DETECTED. The SARS-CoV-2 RNA is generally detectable in upper and lower  respiratory specimens during the acute phase of infection. Negative results do not preclude SARS-CoV-2 infection, do not rule out co-infections with other pathogens, and should not be used as the sole basis for treatment or other patient management decisions. Negative results must be combined with clinical observations, patient history, and epidemiological information. The expected result is Negative. Fact Sheet for Patients: SugarRoll.be Fact Sheet for Healthcare Providers: https://www.woods-mathews.com/ This test is not yet approved or cleared by the [place] FDA and  has been authorized for detection and/or diagnosis of SARS-CoV-2 by FDA under an Emergency Use Authorization (EUA). This EUA will remain  in effect (meaning this test can be used) for the duration of the COVID-19 declaration under Section 56 4(b)(1) of the Act, 21 U.S.C. section 360bbb-3(b)(1), unless the authorization is terminated or revoked sooner. Performed at [facility], [place] [address] [place], [place] [address]   Culture, blood (Routine X 2) w Reflex to ID Panel     Status: None (Preliminary result)   Collection Time: 04/29/19  4:30 PM   Specimen: BLOOD RIGHT ARM  Result Value Ref Range Status   Specimen Description   Final    BLOOD RIGHT ARM Performed at [facility], [place] [address] [place], [place] [address]    Special Requests   Final    BOTTLES DRAWN AEROBIC AND ANAEROBIC Blood Culture adequate volume Performed at [facility], [address] [place], [place] [address]    Culture   Final    NO GROWTH 3 DAYS Performed at [facility], [place] [address], [place], [place] [address]    Report Status PENDING  Incomplete   Urine Culture     Status: None   Collection Time: 04/29/19  5:28 PM   Specimen: Urine, Random  Result Value Ref Range Status   Specimen Description   Final    URINE, RANDOM Performed at [facility], [address] [place], [place] [address]    Special Requests   Final    NONE Performed at [facility], [address] [place], [place] [address]    Culture   Final    NO GROWTH Performed at [facility], [place] [address] [place], [place] [address]    Report Status 04/30/2019 FINAL  Final  MRSA PCR Screening     Status: None   Collection Time: 04/30/19  6:47 AM   Specimen: Nasal Mucosa; Nasopharyngeal  Result Value Ref Range Status   MRSA by PCR NEGATIVE NEGATIVE Final    Comment:        The GeneXpert MRSA Assay (FDA approved for NASAL specimens only), is one component of a comprehensive MRSA colonization surveillance program. It is not intended to diagnose MRSA infection nor to guide or monitor treatment for MRSA infections. Performed at [facility], [place] [address], [place], [place] [address]   Gram stain     Status: None   Collection Time: 04/30/19 10:17 AM   Specimen: PATH Cytology Peritoneal fluid  Result Value Ref Range Status   Specimen Description FLUID PERITONEAL  Final   Special Requests NONE  Final   Gram Stain   Final    FEW WBC PRESENT, PREDOMINANTLY MONONUCLEAR NO ORGANISMS SEEN Performed at [facility], [address], [place], [place] [address]    Report Status 04/30/2019 FINAL  Final  Culture, body fluid-bottle     Status: None (Preliminary result)  Collection Time: 04/30/19 10:17 AM   Specimen: Fluid  Result Value Ref Range Status   Specimen Description FLUID PERITONEAL  Final   Special Requests NONE  Final   Culture   Final    NO GROWTH 2 DAYS Performed at [facility], [address], [place], [place] [address]    Report Status PENDING  Incomplete      Radiology Studies: No results found.    Scheduled Meds: . apixaban  5 mg Oral BID  . aspirin EC  81 mg Oral Daily  . atorvastatin  80 mg Oral q1800  . cefdinir  300 mg Oral Q12H  . diltiazem  30 mg Oral Q6H  . insulin aspart  0-15 Units Subcutaneous TID WC  . insulin aspart  0-5 Units Subcutaneous QHS  . insulin glargine  4 Units Subcutaneous QHS  . magnesium oxide  400 mg Oral BID  . Melatonin  6 mg Oral QHS  . memantine  10 mg Oral QHS  . multivitamin with minerals  1 tablet Oral Daily  . omega-3 acid ethyl esters  1,000 mg Oral BID  . tamsulosin  0.4 mg Oral Daily   Continuous Infusions: . sodium chloride Stopped (05/01/19 1500)     LOS: 3 days    Time spent: 25 minutes spent in the coordination of care today.    Jonnie Finner, DO [facility] Pager [phone]  If 7PM-7AM, please contact night-coverage [email or web address] Password [email or web address] 05/02/2019, 1:55 PM

## 2019-05-02 NOTE — Progress Notes (Signed)
Pharmacy Antibiotic Note  Brian French. is a 78 y.o. male admitted on 04/29/2019 with a possible right-sided pneumonia and potential pyellonephritis. Pt was previously on doxycycline and keflex but started to worsen. CTA is negative for PE. Pharmacy has been consulted for cefepime dosing.  WBC is still elevated at 14.5. He is afebrile.   Plan: Increase Cefepime to 2g IV Q8 hours for improved renal fxn Would consider transition to cefdinir 300mg  BID given AMS effects of quinolones.  Monitor renal function, WBC, temp, and clinical status  Height: 5\' 11"  (180.3 cm) Weight: 230 lb 4.8 oz (104.5 kg) IBW/kg (Calculated) : 75.3  Temp (24hrs), Avg:98 F (36.7 C), Min:97.4 F (36.3 C), Max:98.6 F (37 C)  Recent Labs  Lab 04/25/19 1350 04/29/19 1438 04/29/19 1738 04/30/19 0543 05/01/19 0404 05/02/19 0409  WBC 14.5* 14.5*  --  12.8* 14.2* 14.5*  CREATININE 1.27* 1.53*  --  1.37* 1.26* 1.26*  LATICACIDVEN  --  1.6 1.2  --   --   --     Estimated Creatinine Clearance: 60.4 mL/min (A) (by C-G formula based on SCr of 1.26 mg/dL (H)).    No Known Allergies  Antimicrobials this admission: Vancomycin 10/15 >>10/17  Cefepime 10/15 >>  Flagyl 10/15 >>10/17   Microbiology results: 10/15 BCx: Sent 10/15 UCx: Sent 10/15 COVID: Sent  Brynnley Dayrit A. Levada Dy, PharmD, BCPS, FNKF Clinical Pharmacist [place] Please utilize Amion for appropriate phone number to reach the unit pharmacist ([place])   05/02/2019 7:54 AM

## 2019-05-03 LAB — GLUCOSE, CAPILLARY
Glucose-Capillary: 189 mg/dL — ABNORMAL HIGH (ref 70–99)
Glucose-Capillary: 214 mg/dL — ABNORMAL HIGH (ref 70–99)

## 2019-05-03 LAB — RENAL FUNCTION PANEL
Albumin: 2.4 g/dL — ABNORMAL LOW (ref 3.5–5.0)
Anion gap: 10 (ref 5–15)
BUN: 16 mg/dL (ref 8–23)
CO2: 22 mmol/L (ref 22–32)
Calcium: 8.3 mg/dL — ABNORMAL LOW (ref 8.9–10.3)
Chloride: 103 mmol/L (ref 98–111)
Creatinine, Ser: 1.33 mg/dL — ABNORMAL HIGH (ref 0.61–1.24)
GFR calc Af Amer: 59 mL/min — ABNORMAL LOW (ref >60)
GFR calc non Af Amer: 51 mL/min — ABNORMAL LOW (ref >60)
Glucose, Bld: 193 mg/dL — ABNORMAL HIGH (ref 70–99)
Phosphorus: 3 mg/dL (ref 2.5–4.6)
Potassium: 4.1 mmol/L (ref 3.5–5.1)
Sodium: 135 mmol/L (ref 135–145)

## 2019-05-03 LAB — CBC
HCT: 34.2 % — ABNORMAL LOW (ref 39.0–52.0)
Hemoglobin: 11.7 g/dL — ABNORMAL LOW (ref 13.0–17.0)
MCH: 30.7 pg (ref 26.0–34.0)
MCHC: 34.2 g/dL (ref 30.0–36.0)
MCV: 89.8 fL (ref 80.0–100.0)
Platelets: 177 10*3/uL (ref 150–400)
RBC: 3.81 MIL/uL — ABNORMAL LOW (ref 4.22–5.81)
RDW: 13.6 % (ref 11.5–15.5)
WBC: 14.1 10*3/uL — ABNORMAL HIGH (ref 4.0–10.5)
nRBC: 0 % (ref 0.0–0.2)

## 2019-05-03 LAB — MAGNESIUM: Magnesium: 1.8 mg/dL (ref 1.7–2.4)

## 2019-05-03 MED ORDER — APIXABAN 5 MG PO TABS: 5.0000 mg | ORAL_TABLET | Freq: Two times a day (BID) | ORAL | 0 refills | Status: DC

## 2019-05-03 MED ORDER — DILTIAZEM HCL ER COATED BEADS 120 MG PO CP24: 120.0000 mg | ORAL_CAPSULE | Freq: Every day | ORAL | 11 refills | Status: DC

## 2019-05-03 MED ORDER — DILTIAZEM HCL ER COATED BEADS 120 MG PO CP24: 120.0000 mg | ORAL_CAPSULE | Freq: Every day | ORAL | 11 refills | Status: AC

## 2019-05-03 MED ORDER — CEFDINIR 300 MG PO CAPS: 300.0000 mg | ORAL_CAPSULE | Freq: Two times a day (BID) | ORAL | 0 refills | Status: AC

## 2019-05-03 NOTE — TOC Initial Note (Signed)
Transition of Care (TOC) - Initial/Assessment Note    Patient Details  Name: Brian French. MRN: [id number] Date of Birth: 1940-10-15  Transition of Care [facility]) CM/SW Contact:    Bartholomew Crews, RN Phone Number: [phone] 05/03/2019, 12:59 PM  Clinical Narrative:                 Spoke with patient and daughter at bedside. PTA home with spouse. PCP is Dr. Domenica Fail at [facility]. Patient already has an appointment scheduled for 05/28/2019 at 1:30pm. Faxed DC summary to clinic at [phone]. Requested that MD send electronic scripts for eliquis and dilitiazem to [facility] to facilitate acquisition of refills. Family to pick up scripts at [facility] today. No further transition of care needs identified. Patient to transition home with family today.   Expected Discharge Plan: Home/Self Care Barriers to Discharge: No Barriers Identified   Patient Goals and CMS Choice Patient states their goals for this hospitalization and ongoing recovery are:: return home      Expected Discharge Plan and Services Expected Discharge Plan: Home/Self Care   Discharge Planning Services: CM Consult   Living arrangements for the past 2 months: Single Family Home Expected Discharge Date: 05/03/19               DME Arranged: N/A DME Agency: NA       HH Arranged: NA [place] Agency: NA        Prior Living Arrangements/Services Living arrangements for the past 2 months: Single Family Home Lives with:: Self, Spouse          Need for Family Participation in Patient Care: Yes (Comment) Care giver support system in place?: Yes (comment)   Criminal Activity/Legal Involvement Pertinent to Current Situation/Hospitalization: No - Comment as needed  Activities of Daily Living Home Assistive Devices/Equipment: CBG Meter ADL Screening (condition at time of admission) Patient's cognitive ability adequate to safely complete daily activities?: Yes Is the patient deaf or have difficulty  hearing?: Yes Does the patient have difficulty seeing, even when wearing glasses/contacts?: No Does the patient have difficulty concentrating, remembering, or making decisions?: No Patient able to express need for assistance with ADLs?: Yes Does the patient have difficulty dressing or bathing?: No Independently performs ADLs?: Yes (appropriate for developmental age) Does the patient have difficulty walking or climbing stairs?: Yes Weakness of Legs: Both Weakness of Arms/Hands: None  Permission Sought/Granted Permission sought to share information with : Family Supports                Emotional Assessment Appearance:: Appears stated age Attitude/Demeanor/Rapport: Engaged Affect (typically observed): Accepting Orientation: : Oriented to  Time, Oriented to Place, Oriented to Self, Oriented to Situation Alcohol / Substance Use: Not Applicable Psych Involvement: No (comment)  Admission diagnosis:  Generalized abdominal pain [R10.84] New onset atrial fibrillation ([place]) [I48.91] Right flank pain [R10.9] Atrial fibrillation with RVR (HCC) [I48.91] Syncope, unspecified syncope type [R55] Community acquired pneumonia of right middle lobe of lung [J18.9] PNA (pneumonia) [J18.9] Patient Active Problem List   Diagnosis Date Noted  . Atrial fibrillation with RVR ([place]) 04/30/2019  . Acute pyelonephritis 04/30/2019  . Parapneumonic effusion 04/30/2019  . DM2 (diabetes mellitus, type 2) ([place]) 04/30/2019  . Community acquired pneumonia of right upper lobe of lung 04/29/2019  . Vertigo 05/22/2015  . Headache disorder 05/22/2015   PCP:  Clinic, [place]:   [facility] [phone] - [place], [address] AT [address] [place] [address] Phone: [phone] Fax: [place], [place] - [id number] [place]  [address] Phone: [phone] Fax: [phone]     Social Determinants of Health (SDOH) Interventions    Readmission Risk Interventions No flowsheet data found.

## 2019-05-03 NOTE — Progress Notes (Signed)
DISCHARGE NOTE  Brian French. to be discharged Home per MD order. Patient verbalized understanding.  Skin clean, dry and intact without evidence of skin break down, no evidence of skin tears noted. IV catheter discontinued intact. Site without signs and symptoms of complications. Dressing and pressure applied. Pt denies pain at the site currently. No complaints noted.  Patient free of lines, drains, and wounds.   Discharge packet assembled. An After Visit Summary (AVS) was printed and given to the patient at bedside. Patient escorted via wheelchair and discharged to designated family member via private transportation. All questions and concerns addressed.   Dolores Hoose, RN

## 2019-05-03 NOTE — Care Management Important Message (Signed)
Important Message  Patient Details  Name: Brian French. MRN: [id number] Date of Birth: 02-05-1941   Medicare Important Message Given:  Yes     Eulises Kijowski 05/03/2019, 4:04 PM

## 2019-05-03 NOTE — Plan of Care (Signed)
  Problem: Education: Goal: Knowledge of General Education information will improve Description: Including pain rating scale, medication(s)/side effects and non-pharmacologic comfort measures Outcome: Progressing   Problem: Health Behavior/Discharge Planning: Goal: Ability to manage health-related needs will improve Outcome: Progressing   Problem: Safety: Goal: Ability to remain free from injury will improve Outcome: Progressing   

## 2019-05-03 NOTE — Progress Notes (Signed)
Pt stated he will tell nurse when he has to self-cath. He stated that he normally feels an urge and then he does it. He stated at this time that he does not feel an urge and wants to wait. Pt is not confused at this time, pt has been answering questions appropriately. Will continue to monitor.   Eleanora Neighbor, RN

## 2019-05-03 NOTE — Plan of Care (Signed)
  Problem: Education: Goal: Knowledge of General Education information will improve Description: Including pain rating scale, medication(s)/side effects and non-pharmacologic comfort measures 05/03/2019 1234 by Dolores Hoose, RN Outcome: Adequate for Discharge 05/03/2019 6415 by Dolores Hoose, RN Outcome: Progressing   Problem: Health Behavior/Discharge Planning: Goal: Ability to manage health-related needs will improve 05/03/2019 1234 by Dolores Hoose, RN Outcome: Adequate for Discharge 05/03/2019 8309 by Dolores Hoose, RN Outcome: Progressing   Problem: Clinical Measurements: Goal: Ability to maintain clinical measurements within normal limits will improve Outcome: Adequate for Discharge Goal: Will remain free from infection Outcome: Adequate for Discharge Goal: Diagnostic test results will improve Outcome: Adequate for Discharge Goal: Respiratory complications will improve Outcome: Adequate for Discharge Goal: Cardiovascular complication will be avoided Outcome: Adequate for Discharge

## 2019-05-03 NOTE — Discharge Summary (Signed)
. Physician Discharge Summary  Brian French. ZOX:[id number] DOB: January 07, 1941 DOA: 04/29/2019  PCP: [facility]  Admit date: 04/29/2019 Discharge date: 05/03/2019  Admitted From: Home Disposition:  Discharged to home.  Recommendations for Outpatient Follow-up:  1. Follow up with PCP in 1-2 weeks 2. Please obtain BMP/CBC in one week  Discharge Condition: Stable  CODE STATUS: FULL   Brief/Interim Summary: Brian Frenchis a 78 y.o.malewith medical history significant ofDM2, HTN, HLD. Patient chronically self caths Q8H for past couple of years. Patient seen in ED x4 days ago forright flank pain worsening over one month. He was diagnosed withsmallright pleural effusion, possible right sided pneumonia, and an incedental 3cm AAA, and discharged on ABx. Patient returns to the ED today with persistent R flank pain and now abd pain / back pain despite taking ABx.  10/17: Confused this AM. Says he stopped taking orders when he was left the [employer]. 10/18: In better spirits this AM. Says he's eager to go home.  10/19: Feels better this AM. Denies complaints.   Discharge Diagnoses:  Principal Problem:   Acute pyelonephritis Active Problems:   Community acquired pneumonia of right upper lobe of lung   Atrial fibrillation with RVR (HCC)   Parapneumonic effusion   DM2 (diabetes mellitus, type 2) (HCC)  CAP RUL/RML Parapneumonic effusion Acute pyelonephritis - cefepime; stopped vanc - 10/18: will transition to cefdinir; total 10-day course abx     - 10/19: Complete 7 more days of cefdinir at discharge  Atrial Fib - ChadVasc: 4 - rates 80's - 100's - on heparin gtt; needs oral anticoag;discussed anticoagulation purpose, risks, benefits with wife in great detail; she would like to try eliquis; will start today - let's start low dose IR dilt as well - will need follow up with [facility] cardiology     - 10/18: tolerating IR dilt and  eliquis; will transition to CR dilt in AM      - 10/19: continue eliquis 5mg  BID, continue dilt as dilt CR 120mg ; follow up with PCP in 1 week.  DM2 - on SSI - lantus 4 units qHS     - glucose acceptable     - resume home regimen at discharge  Hypomagnesemia - continue magOx      - resolved  Fall - unwitnessed fall - no neurological change, head injury - sitter  Discharge Instructions   Allergies as of 05/03/2019   No Known Allergies     Medication List    STOP taking these medications   cephALEXin 500 MG capsule Commonly known as: KEFLEX   doxycycline 100 MG capsule Commonly known as: VIBRAMYCIN   lisinopril 10 MG tablet Commonly known as: ZESTRIL     TAKE these medications   acetaminophen 500 MG tablet Commonly known as: TYLENOL Take 1,000 mg by mouth every 6 (six) hours as needed for mild pain or headache.   apixaban 5 MG Tabs tablet Commonly known as: ELIQUIS Take 1 tablet (5 mg total) by mouth 2 (two) times daily.   aspirin EC 81 MG tablet Take 81 mg by mouth daily.   atorvastatin 80 MG tablet Commonly known as: LIPITOR Take 80 mg by mouth daily at 6 PM.   cefdinir 300 MG capsule Commonly known as: OMNICEF Take 1 capsule (300 mg total) by mouth every 12 (twelve) hours for 7 days.   cyclobenzaprine 10 MG tablet Commonly known as: FLEXERIL Take 1 tablet (10 mg total) by mouth 3 (three) times daily as needed for  muscle spasms.   diltiazem 120 MG 24 hr capsule Commonly known as: Cardizem CD Take 1 capsule (120 mg total) by mouth daily.   Fish Oil 1000 MG Caps Take 2,000 mg by mouth 2 (two) times daily.   glipiZIDE 10 MG tablet Commonly known as: GLUCOTROL Take 10 mg by mouth 2 (two) times daily before a meal.   lidocaine 5 % Commonly known as: Lidoderm Place 1 patch onto the skin daily. Remove & Discard patch within 12 hours or as directed by MD   memantine 10 MG tablet Commonly known as: NAMENDA Take 10 mg by  mouth at bedtime.   metFORMIN 500 MG tablet Commonly known as: GLUCOPHAGE Take 500 mg by mouth 2 (two) times daily with a meal.   multivitamin with minerals Tabs tablet Take 1 tablet by mouth daily.   omeprazole 20 MG capsule Commonly known as: PRILOSEC Take 20 mg by mouth daily.   pioglitazone 45 MG tablet Commonly known as: ACTOS Take 45 mg by mouth daily.   tamsulosin 0.4 MG Caps capsule Commonly known as: FLOMAX Take 0.4 mg by mouth daily.       No Known Allergies   Procedures/Studies: Dg Chest 1 View  Result Date: 04/30/2019 CLINICAL DATA:  Status post right thoracentesis. EXAM: CHEST  1 VIEW COMPARISON:  04/29/2019 FINDINGS: Normal heart size. Decreased right pleural effusion. No pneumothorax identified status post thoracentesis. Opacity along the right heart border is again noted corresponding to right middle lobe consolidation/atelectasis. IMPRESSION: 1. No pneumothorax status post right thoracentesis. 2. Right middle lobe consolidation/atelectasis. 3. No change in aeration to the right lung. Electronically Signed   By: Kerby Moors M.D.   On: 04/30/2019 10:32   Ct Angio Chest Pe W And/or Wo Contrast  Result Date: 04/29/2019 CLINICAL DATA:  Acute abdominal and back pain. EXAM: CT ANGIOGRAPHY CHEST CT ABDOMEN AND PELVIS WITH CONTRAST TECHNIQUE: Multidetector CT imaging of the chest was performed using the standard protocol during bolus administration of intravenous contrast. Multiplanar CT image reconstructions and MIPs were obtained to evaluate the vascular anatomy. Multidetector CT imaging of the abdomen and pelvis was performed using the standard protocol during bolus administration of intravenous contrast. CONTRAST:  144mL OMNIPAQUE IOHEXOL 350 MG/ML SOLN COMPARISON:  April 25, 2019. FINDINGS: CTA CHEST FINDINGS Cardiovascular: No evidence of large central pulmonary embolus is noted. However there appears to be non opacification of a branch of the right pulmonary  artery in the right middle lobe; is uncertain if this is due to pulmonary embolus or compression from surrounding atelectasis or inflammation. Normal heart size. No pericardial effusion. Atherosclerosis of thoracic aorta is noted without aneurysm formation. Coronary artery calcifications are noted. Mediastinum/Nodes: 11 mm pretracheal lymph node is noted. 3 cm subcarinal adenopathy is noted. Esophagus is unremarkable. Thyroid gland is unremarkable. Lungs/Pleura: No pneumothorax is noted. Left lung is clear. Moderate right pleural effusion is noted with adjacent subsegmental atelectasis in the right lower lobe. Right middle lobe atelectasis or pneumonia is noted. Air bronchogram is noted. 7 mm nodular density is noted in right middle lobe best seen on image number 53 of series 5. Ill-defined opacity is noted in the right upper lobe most consistent with pneumonia. Musculoskeletal: No chest wall abnormality. No acute or significant osseous findings. Review of the MIP images confirms the above findings. CT ABDOMEN and PELVIS FINDINGS Hepatobiliary: No focal liver abnormality is seen. No gallstones, gallbladder wall thickening, or biliary dilatation. Pancreas: Unremarkable. No pancreatic ductal dilatation or surrounding inflammatory changes. Spleen:  Normal in size without focal abnormality. Adrenals/Urinary Tract: 1.6 cm left adrenal nodule is noted. Right adrenal gland is unremarkable. Left renal cyst is noted. Right renal atrophy and cortical scarring is noted. No hydronephrosis or renal obstruction is noted. There are multiple rounded ill-defined low densities involving both kidneys which may represent renal infarction or possibly lobar nephronia. Urinary bladder is mildly distended with trabeculations. Stomach/Bowel: Stomach is within normal limits. Appendix appears normal. No evidence of bowel wall thickening, distention, or inflammatory changes. Vascular/Lymphatic: 3 cm infrarenal abdominal aortic aneurysm is  noted. No adenopathy is noted. Reproductive: Prostate is unremarkable. Other: No abdominal wall hernia or abnormality. No abdominopelvic ascites. Musculoskeletal: No acute or significant osseous findings. Review of the MIP images confirms the above findings. IMPRESSION: There is no definite evidence of large central pulmonary embolus. However, there is noted lack of opacification of the distal portion of a branch of the right pulmonary artery in the right middle lobe; it is uncertain if this is due to pulmonary embolus or compression from adjacent atelectasis or pneumonia. Ill-defined low densities are noted throughout both kidneys, concerning for possible multiple areas of infarction or possibly pyelonephritis. Clinical correlation is recommended. Right renal atrophy and cortical scarring is noted. As noted above, large opacity is seen in right middle lobe concerning for pneumonia or atelectasis. Endobronchial obstruction can not be excluded. Also noted is mediastinal adenopathy which may be inflammatory or neoplastic in origin. Clinical correlation is recommended. Moderate right pleural effusion is noted with adjacent subsegmental atelectasis of the right lower lobe. Probable right upper lobe pneumonia is noted. 7 mm nodule seen in right middle lobe. Non-contrast chest CT at 6-12 months is recommended. If the nodule is stable at time of repeat CT, then future CT at 18-24 months (from today's scan) is considered optional for low-risk patients, but is recommended for high-risk patients. This recommendation follows the consensus statement: Guidelines for Management of Incidental Pulmonary Nodules Detected on CT Images: From the Fleischner Society 2017; Radiology 2017; 284:228-243. Coronary artery calcifications are noted. 1.6 cm left adrenal nodule is noted. Follow-up CT scan or MRI in 12 months is recommended ensure stability. 3 cm infrarenal abdominal aortic aneurysm. Recommend followup by ultrasound in 3 years.  This recommendation follows ACR consensus guidelines: White Paper of the ACR Incidental Findings Committee II on Vascular Findings. J Am Coll Radiol 2013; 10:789-794. Aortic aneurysm NOS (ICD10-I71.9). Aortic Atherosclerosis (ICD10-I70.0). Electronically Signed   By: Marijo Conception M.D.   On: 04/29/2019 16:09   Ct Abdomen Pelvis W Contrast  Result Date: 04/29/2019 CLINICAL DATA:  Acute abdominal and back pain. EXAM: CT ANGIOGRAPHY CHEST CT ABDOMEN AND PELVIS WITH CONTRAST TECHNIQUE: Multidetector CT imaging of the chest was performed using the standard protocol during bolus administration of intravenous contrast. Multiplanar CT image reconstructions and MIPs were obtained to evaluate the vascular anatomy. Multidetector CT imaging of the abdomen and pelvis was performed using the standard protocol during bolus administration of intravenous contrast. CONTRAST:  161mL OMNIPAQUE IOHEXOL 350 MG/ML SOLN COMPARISON:  April 25, 2019. FINDINGS: CTA CHEST FINDINGS Cardiovascular: No evidence of large central pulmonary embolus is noted. However there appears to be non opacification of a branch of the right pulmonary artery in the right middle lobe; is uncertain if this is due to pulmonary embolus or compression from surrounding atelectasis or inflammation. Normal heart size. No pericardial effusion. Atherosclerosis of thoracic aorta is noted without aneurysm formation. Coronary artery calcifications are noted. Mediastinum/Nodes: 11 mm pretracheal lymph node is noted.  3 cm subcarinal adenopathy is noted. Esophagus is unremarkable. Thyroid gland is unremarkable. Lungs/Pleura: No pneumothorax is noted. Left lung is clear. Moderate right pleural effusion is noted with adjacent subsegmental atelectasis in the right lower lobe. Right middle lobe atelectasis or pneumonia is noted. Air bronchogram is noted. 7 mm nodular density is noted in right middle lobe best seen on image number 53 of series 5. Ill-defined opacity is noted  in the right upper lobe most consistent with pneumonia. Musculoskeletal: No chest wall abnormality. No acute or significant osseous findings. Review of the MIP images confirms the above findings. CT ABDOMEN and PELVIS FINDINGS Hepatobiliary: No focal liver abnormality is seen. No gallstones, gallbladder wall thickening, or biliary dilatation. Pancreas: Unremarkable. No pancreatic ductal dilatation or surrounding inflammatory changes. Spleen: Normal in size without focal abnormality. Adrenals/Urinary Tract: 1.6 cm left adrenal nodule is noted. Right adrenal gland is unremarkable. Left renal cyst is noted. Right renal atrophy and cortical scarring is noted. No hydronephrosis or renal obstruction is noted. There are multiple rounded ill-defined low densities involving both kidneys which may represent renal infarction or possibly lobar nephronia. Urinary bladder is mildly distended with trabeculations. Stomach/Bowel: Stomach is within normal limits. Appendix appears normal. No evidence of bowel wall thickening, distention, or inflammatory changes. Vascular/Lymphatic: 3 cm infrarenal abdominal aortic aneurysm is noted. No adenopathy is noted. Reproductive: Prostate is unremarkable. Other: No abdominal wall hernia or abnormality. No abdominopelvic ascites. Musculoskeletal: No acute or significant osseous findings. Review of the MIP images confirms the above findings. IMPRESSION: There is no definite evidence of large central pulmonary embolus. However, there is noted lack of opacification of the distal portion of a branch of the right pulmonary artery in the right middle lobe; it is uncertain if this is due to pulmonary embolus or compression from adjacent atelectasis or pneumonia. Ill-defined low densities are noted throughout both kidneys, concerning for possible multiple areas of infarction or possibly pyelonephritis. Clinical correlation is recommended. Right renal atrophy and cortical scarring is noted. As noted  above, large opacity is seen in right middle lobe concerning for pneumonia or atelectasis. Endobronchial obstruction can not be excluded. Also noted is mediastinal adenopathy which may be inflammatory or neoplastic in origin. Clinical correlation is recommended. Moderate right pleural effusion is noted with adjacent subsegmental atelectasis of the right lower lobe. Probable right upper lobe pneumonia is noted. 7 mm nodule seen in right middle lobe. Non-contrast chest CT at 6-12 months is recommended. If the nodule is stable at time of repeat CT, then future CT at 18-24 months (from today's scan) is considered optional for low-risk patients, but is recommended for high-risk patients. This recommendation follows the consensus statement: Guidelines for Management of Incidental Pulmonary Nodules Detected on CT Images: From the Fleischner Society 2017; Radiology 2017; 284:228-243. Coronary artery calcifications are noted. 1.6 cm left adrenal nodule is noted. Follow-up CT scan or MRI in 12 months is recommended ensure stability. 3 cm infrarenal abdominal aortic aneurysm. Recommend followup by ultrasound in 3 years. This recommendation follows ACR consensus guidelines: White Paper of the ACR Incidental Findings Committee II on Vascular Findings. J Am Coll Radiol 2013; 10:789-794. Aortic aneurysm NOS (ICD10-I71.9). Aortic Atherosclerosis (ICD10-I70.0). Electronically Signed   By: Marijo Conception M.D.   On: 04/29/2019 16:09   Dg Chest Portable 1 View  Result Date: 04/29/2019 CLINICAL DATA:  Flank pain. EXAM: PORTABLE CHEST 1 VIEW COMPARISON:  Body CT April 25, 2019 FINDINGS: Cardiomediastinal silhouette is normal. Mediastinal contours appear intact. Large airspace opacity  in the right cardiophrenic angle. Probable right pleural effusion. Osseous structures are without acute abnormality. Soft tissues are grossly normal. IMPRESSION: 1. Large airspace opacity in the right cardiophrenic angle may represent airspace  consolidation or a pulmonary mass. 2. Probable right pleural effusion. Electronically Signed   By: Fidela Salisbury M.D.   On: 04/29/2019 15:22   Ct Renal Stone Study  Result Date: 04/25/2019 CLINICAL DATA:  Right flank pain for 7 days. Elevated white blood cell count. EXAM: CT ABDOMEN AND PELVIS WITHOUT CONTRAST TECHNIQUE: Multidetector CT imaging of the abdomen and pelvis was performed following the standard protocol without IV contrast. COMPARISON:  CT abdomen and pelvis 07/14/2014. FINDINGS: Lower chest: The patient has a small right pleural effusion. Focal airspace disease is seen in the right middle lobe. No left effusion or airspace disease. Small pericardial effusion is noted. Hepatobiliary: No focal liver abnormality is seen. No gallstones, gallbladder wall thickening, or biliary dilatation. Pancreas: Unremarkable. No pancreatic ductal dilatation or surrounding inflammatory changes. Spleen: Normal in size without focal abnormality. Adrenals/Urinary Tract: There is no hydronephrosis on the right or left. 4.2 cm simple left renal cyst is noted. The urinary bladder is incompletely distended. There is marked thickening the anterior, superior and lateral walls of the bladder. No gas is seen within the bladder walls or lumen of the bladder. Stomach/Bowel: Stomach is within normal limits. Appendix appears normal. No evidence of bowel wall thickening, distention, or inflammatory changes. Vascular/Lymphatic: The patient has extensive aortic atherosclerosis. The infrarenal aorta measures up to 3.1 x 3.0 cm. No lymphadenopathy. Reproductive: Prostate is unremarkable. Other: A small fat containing umbilical hernia is seen. Musculoskeletal: No acute or focal abnormality. Mild degenerative retrolisthesis L3 on L4 noted. IMPRESSION: Marked thickening of the anterior, superior and lateral walls of the urinary bladder could be due to incomplete distension, chronic outlet obstruction or cystitis. Negative for  hydronephrosis or urinary tract stone. Small right pleural effusion and focal airspace disease in the right middle lobe worrisome for pneumonia. Atherosclerosis with a 3.1 cm infrarenal abdominal aortic aneurysm. Recommend followup by ultrasound in 3 years. This recommendation follows ACR consensus guidelines: White Paper of the ACR Incidental Findings Committee II on Vascular Findings. Natasha Mead Coll Radiol 2013; [id number] Electronically Signed   By: Inge Rise M.D.   On: 04/25/2019 14:34   Ir Thoracentesis Asp Pleural Space W/img Guide  Result Date: 04/30/2019 INDICATION: Patient with shortness of breath, right pleural effusion. Request is made for diagnostic and therapeutic thoracentesis. EXAM: ULTRASOUND GUIDED DIAGNOSTIC AND THERAPEUTIC RIGHT THORACENTESIS MEDICATIONS: 10 mL 1% lidocaine COMPLICATIONS: None immediate. PROCEDURE: An ultrasound guided thoracentesis was thoroughly discussed with the patient and questions answered. The benefits, risks, alternatives and complications were also discussed. The patient understands and wishes to proceed with the procedure. Written consent was obtained. Ultrasound was performed to localize and mark an adequate pocket of fluid in the right chest. The area was then prepped and draped in the normal sterile fashion. 1% Lidocaine was used for local anesthesia. Under ultrasound guidance a 6 Fr Safe-T-Centesis catheter was introduced. Thoracentesis was performed. The catheter was removed and a dressing applied. FINDINGS: A total of approximately 900 mL of amber fluid was removed. Samples were sent to the laboratory as requested by the clinical team. IMPRESSION: Successful ultrasound guided diagnostic and therapeutic right thoracentesis yielding 900 mL of pleural fluid. Read by: Brynda Greathouse PA-C Electronically Signed   By: Corrie Mckusick D.O.   On: 04/30/2019 14:04      Subjective: "Alright.  I'll let my wife know."  Discharge Exam: Vitals:   05/02/19 2105  05/03/19 0439  BP: 133/63 136/71  Pulse: 82 81  Resp: 16 18  Temp: 98.2 F (36.8 C) 98.5 F (36.9 C)  SpO2: 95% 94%   Vitals:   05/02/19 0852 05/02/19 1635 05/02/19 2105 05/03/19 0439  BP: 123/71 124/77 133/63 136/71  Pulse: 85 81 82 81  Resp: 18 18 16 18   Temp: 98 F (36.7 C) (!) 97.5 F (36.4 C) 98.2 F (36.8 C) 98.5 F (36.9 C)  TempSrc: Oral Oral Oral Oral  SpO2: 94% 94% 95% 94%  Weight:      Height:        General:78 y.o.maleresting in bedin NAD, smiling and in good mood Cardiovascular:RRR, +S1, S2, no m/g/r, equal pulses throughout Respiratory:slight rhonchi on right, no wheeze; normal WOB GI: BS+, NDNT, no masses noted MSK: No e/c/c Neuro: Alert to name, follows commands Psyc: calm/cooperative    The results of significant diagnostics from this hospitalization (including imaging, microbiology, ancillary and laboratory) are listed below for reference.     Microbiology: Recent Results (from the past 240 hour(s))  Culture, blood (Routine X 2) w Reflex to ID Panel     Status: None (Preliminary result)   Collection Time: 04/29/19  4:20 PM   Specimen: BLOOD LEFT ARM  Result Value Ref Range Status   Specimen Description   Final    BLOOD LEFT ARM Performed at [facility], [address], [place], [place] [address]    Special Requests   Final    BOTTLES DRAWN AEROBIC AND ANAEROBIC Blood Culture adequate volume Performed at [facility], [address] [place], [place] [address]    Culture   Final    NO GROWTH 3 DAYS Performed at [facility], [place] [address] [place], [place] [address]    Report Status PENDING  Incomplete  SARS CORONAVIRUS 2 (TAT 6-24 HRS) Nasopharyngeal Nasopharyngeal Swab     Status: None   Collection Time: 04/29/19  4:21 PM   Specimen: Nasopharyngeal Swab  Result Value Ref Range Status   SARS Coronavirus 2 NEGATIVE NEGATIVE Final    Comment: (NOTE) SARS-CoV-2 target nucleic acids are NOT DETECTED. The  SARS-CoV-2 RNA is generally detectable in upper and lower respiratory specimens during the acute phase of infection. Negative results do not preclude SARS-CoV-2 infection, do not rule out co-infections with other pathogens, and should not be used as the sole basis for treatment or other patient management decisions. Negative results must be combined with clinical observations, patient history, and epidemiological information. The expected result is Negative. Fact Sheet for Patients: SugarRoll.be Fact Sheet for Healthcare Providers: https://www.woods-mathews.com/ This test is not yet approved or cleared by the [place] FDA and  has been authorized for detection and/or diagnosis of SARS-CoV-2 by FDA under an Emergency Use Authorization (EUA). This EUA will remain  in effect (meaning this test can be used) for the duration of the COVID-19 declaration under Section 56 4(b)(1) of the Act, 21 U.S.C. section 360bbb-3(b)(1), unless the authorization is terminated or revoked sooner. Performed at [facility], [place] [address], [place], [place] [address]   Culture, blood (Routine X 2) w Reflex to ID Panel     Status: None (Preliminary result)   Collection Time: 04/29/19  4:30 PM   Specimen: BLOOD RIGHT ARM  Result Value Ref Range Status   Specimen Description   Final    BLOOD RIGHT  ARM Performed at [facility], [place] [address], [place], [place] [address]    Special Requests   Final    BOTTLES DRAWN AEROBIC AND ANAEROBIC Blood Culture adequate volume Performed at [facility], [address] [place], [place] [address]    Culture   Final    NO GROWTH 3 DAYS Performed at [facility], [place] [address], [place], [place] [address]    Report Status PENDING  Incomplete  Urine Culture     Status: None   Collection Time: 04/29/19  5:28 PM   Specimen: Urine, Random  Result Value Ref Range Status   Specimen Description    Final    URINE, RANDOM Performed at [facility], [address] [place], [place] [address]    Special Requests   Final    NONE Performed at [facility], [address] [place], [place] [address]    Culture   Final    NO GROWTH Performed at [facility], [place] [address], [place], [place] [address]    Report Status 04/30/2019 FINAL  Final  MRSA PCR Screening     Status: None   Collection Time: 04/30/19  6:47 AM   Specimen: Nasal Mucosa; Nasopharyngeal  Result Value Ref Range Status   MRSA by PCR NEGATIVE NEGATIVE Final    Comment:        The GeneXpert MRSA Assay (FDA approved for NASAL specimens only), is one component of a comprehensive MRSA colonization surveillance program. It is not intended to diagnose MRSA infection nor to guide or monitor treatment for MRSA infections. Performed at [facility], [place] [address], [place], [place] [address]   Gram stain     Status: None   Collection Time: 04/30/19 10:17 AM   Specimen: PATH Cytology Peritoneal fluid  Result Value Ref Range Status   Specimen Description FLUID PERITONEAL  Final   Special Requests NONE  Final   Gram Stain   Final    FEW WBC PRESENT, PREDOMINANTLY MONONUCLEAR NO ORGANISMS SEEN Performed at [facility], [address], [place], [place] [address]    Report Status 04/30/2019 FINAL  Final  Culture, body fluid-bottle     Status: None (Preliminary result)   Collection Time: 04/30/19 10:17 AM   Specimen: Fluid  Result Value Ref Range Status   Specimen Description FLUID PERITONEAL  Final   Special Requests NONE  Final   Culture   Final    NO GROWTH 2 DAYS Performed at [facility] [address], [place],  [address]    Report Status PENDING  Incomplete     Labs: BNP (last 3 results) No results for input(s): BNP in the last 8760 hours. Basic Metabolic Panel: Recent Labs  Lab 04/29/19 1438 04/30/19 0543 05/01/19 0404 05/02/19 0409  NA 136  137 136 137  K 4.0 4.1 3.9 3.8  CL 101 105 104 104  CO2 22 23 22  21*  GLUCOSE 302* 156* 166* 183*  BUN 28* 21 17 17   CREATININE 1.53* 1.37* 1.26* 1.26*  CALCIUM 9.0 8.1* 8.4* 8.5*  MG  --   --  1.5* 1.7  PHOS  --   --  3.3 3.4   Liver Function Tests: Recent Labs  Lab 04/29/19 1438 05/01/19 0404 05/02/19 0409  AST 10*  --   --   ALT 17  --   --   ALKPHOS 70  --   --  BILITOT 0.7  --   --   PROT 7.3  --   --   ALBUMIN 3.3* 2.7* 2.5*   Recent Labs  Lab 04/29/19 1438  LIPASE 22   No results for input(s): AMMONIA in the last 168 hours. CBC: Recent Labs  Lab 04/29/19 1438 04/30/19 0543 05/01/19 0404 05/02/19 0409 05/03/19 0658  WBC 14.5* 12.8* 14.2* 14.5* 14.1*  NEUTROABS 9.6*  --   --   --   --   HGB 13.7 11.5* 12.1* 12.1* 11.7*  HCT 42.2 34.8* 36.7* 36.3* 34.2*  MCV 91.5 91.1 90.4 90.1 89.8  PLT 174 163 181 186 177   Cardiac Enzymes: No results for input(s): CKTOTAL, CKMB, CKMBINDEX, TROPONINI in the last 168 hours. BNP: Invalid input(s): POCBNP CBG: Recent Labs  Lab 05/02/19 0700 05/02/19 1117 05/02/19 1632 05/02/19 2106 05/03/19 0642  GLUCAP 183* 198* 164* 206* 189*   D-Dimer No results for input(s): DDIMER in the last 72 hours. Hgb A1c No results for input(s): HGBA1C in the last 72 hours. Lipid Profile No results for input(s): CHOL, HDL, LDLCALC, TRIG, CHOLHDL, LDLDIRECT in the last 72 hours. Thyroid function studies No results for input(s): TSH, T4TOTAL, T3FREE, THYROIDAB in the last 72 hours.  Invalid input(s): FREET3 Anemia work up No results for input(s): VITAMINB12, FOLATE, FERRITIN, TIBC, IRON, RETICCTPCT in the last 72 hours. Urinalysis    Component Value Date/Time   COLORURINE YELLOW 04/29/2019 1728   APPEARANCEUR CLEAR 04/29/2019 1728   LABSPEC 1.010 04/29/2019 1728   PHURINE 6.0 04/29/2019 1728   GLUCOSEU 250 (A) 04/29/2019 1728   HGBUR NEGATIVE 04/29/2019 1728   BILIRUBINUR NEGATIVE 04/29/2019 1728   KETONESUR NEGATIVE  04/29/2019 1728   PROTEINUR NEGATIVE 04/29/2019 1728   NITRITE NEGATIVE 04/29/2019 1728   LEUKOCYTESUR NEGATIVE 04/29/2019 1728   Sepsis Labs Invalid input(s): PROCALCITONIN,  WBC,  LACTICIDVEN Microbiology Recent Results (from the past 240 hour(s))  Culture, blood (Routine X 2) w Reflex to ID Panel     Status: None (Preliminary result)   Collection Time: 04/29/19  4:20 PM   Specimen: BLOOD LEFT ARM  Result Value Ref Range Status   Specimen Description   Final    BLOOD LEFT ARM Performed at [facility], [place] [address] [place], [place] [address]    Special Requests   Final    BOTTLES DRAWN AEROBIC AND ANAEROBIC Blood Culture adequate volume Performed at [facility], [place] [place], [place] [address]    Culture   Final    NO GROWTH 3 DAYS Performed at [facility], [place] [address], [place], [place] [address]    Report Status PENDING  Incomplete  SARS CORONAVIRUS 2 (TAT 6-24 HRS) Nasopharyngeal Nasopharyngeal Swab     Status: None   Collection Time: 04/29/19  4:21 PM   Specimen: Nasopharyngeal Swab  Result Value Ref Range Status   SARS Coronavirus 2 NEGATIVE NEGATIVE Final    Comment: (NOTE) SARS-CoV-2 target nucleic acids are NOT DETECTED. The SARS-CoV-2 RNA is generally detectable in upper and lower respiratory specimens during the acute phase of infection. Negative results do not preclude SARS-CoV-2 infection, do not rule out co-infections with other pathogens, and should not be used as the sole basis for treatment or other patient management decisions. Negative results must be combined with clinical observations, patient history, and epidemiological information. The expected result is Negative. Fact Sheet for Patients: SugarRoll.be Fact Sheet for Healthcare Providers: https://www.woods-mathews.com/ This test is not yet approved  or cleared by the [place] and  has been authorized for  detection and/or diagnosis of SARS-CoV-2 by FDA under an Emergency Use Authorization (EUA). This EUA will remain  in effect (meaning this test can be used) for the duration of the COVID-19 declaration under Section 56 4(b)(1) of the Act, 21 U.S.C. section 360bbb-3(b)(1), unless the authorization is terminated or revoked sooner. Performed at [facility], [place] [address], [place], [place] [address]   Culture, blood (Routine X 2) w Reflex to ID Panel     Status: None (Preliminary result)   Collection Time: 04/29/19  4:30 PM   Specimen: BLOOD RIGHT ARM  Result Value Ref Range Status   Specimen Description   Final    BLOOD RIGHT ARM Performed at [facility], [place] [address] [place], [place] [address]    Special Requests   Final    BOTTLES DRAWN AEROBIC AND ANAEROBIC Blood Culture adequate volume Performed at [facility], [place] [place], [place] [address]    Culture   Final    NO GROWTH 3 DAYS Performed at [facility], [place] [address], [place], [place] [address]    Report Status PENDING  Incomplete  Urine Culture     Status: None   Collection Time: 04/29/19  5:28 PM   Specimen: Urine, Random  Result Value Ref Range Status   Specimen Description   Final    URINE, RANDOM Performed at [facility], [place] [place], [place] [address]    Special Requests   Final    NONE Performed at [facility], [place] [place], [place] [address]    Culture   Final    NO GROWTH Performed at [facility], [place] [address], [place], [place] [address]    Report Status 04/30/2019 FINAL  Final  MRSA PCR Screening     Status: None   Collection Time: 04/30/19  6:47 AM   Specimen: Nasal Mucosa; Nasopharyngeal  Result Value Ref Range Status   MRSA by PCR NEGATIVE NEGATIVE Final    Comment:        The GeneXpert MRSA Assay (FDA approved for NASAL specimens only), is one component of a comprehensive MRSA  colonization surveillance program. It is not intended to diagnose MRSA infection nor to guide or monitor treatment for MRSA infections. Performed at [facility], [place] [address], [place], [place] [address]   Gram stain     Status: None   Collection Time: 04/30/19 10:17 AM   Specimen: PATH Cytology Peritoneal fluid  Result Value Ref Range Status   Specimen Description FLUID PERITONEAL  Final   Special Requests NONE  Final   Gram Stain   Final    FEW WBC PRESENT, PREDOMINANTLY MONONUCLEAR NO ORGANISMS SEEN Performed at [facility], [address], [place], [place] [address]    Report Status 04/30/2019 FINAL  Final  Culture, body fluid-bottle     Status: None (Preliminary result)   Collection Time: 04/30/19 10:17 AM   Specimen: Fluid  Result Value Ref Range Status   Specimen Description FLUID PERITONEAL  Final   Special Requests NONE  Final   Culture   Final    NO GROWTH 2 DAYS Performed at [facility] [address] [place], [place] [address]    Report Status PENDING  Incomplete     Time coordinating discharge: 35 minutes  SIGNED:   Jonnie Finner, DO  [facility] 05/03/2019, 7:36 AM Pager   If 7PM-7AM, please contact night-coverage [email or web address] Password [email or web address]

## 2019-05-04 LAB — CULTURE, BLOOD (ROUTINE X 2)
Culture: NO GROWTH
Culture: NO GROWTH
Special Requests: ADEQUATE
Special Requests: ADEQUATE

## 2019-05-05 LAB — CULTURE, BODY FLUID W GRAM STAIN -BOTTLE: Culture: NO GROWTH

## 2019-05-08 ENCOUNTER — Other Ambulatory Visit: Payer: Self-pay

## 2019-05-08 ENCOUNTER — Emergency Department (HOSPITAL_BASED_OUTPATIENT_CLINIC_OR_DEPARTMENT_OTHER): Payer: No Typology Code available for payment source

## 2019-05-08 ENCOUNTER — Emergency Department (HOSPITAL_BASED_OUTPATIENT_CLINIC_OR_DEPARTMENT_OTHER)
Admission: EM | Admit: 2019-05-08 | Discharge: 2019-05-08 | Disposition: A | Discharge status: Home / Self Care | Payer: No Typology Code available for payment source | Attending: Emergency Medicine | Admitting: Emergency Medicine

## 2019-05-08 ENCOUNTER — Encounter (HOSPITAL_BASED_OUTPATIENT_CLINIC_OR_DEPARTMENT_OTHER): Payer: Self-pay | Admitting: Emergency Medicine

## 2019-05-08 DIAGNOSIS — R109 Unspecified abdominal pain: Secondary | ICD-10-CM

## 2019-05-08 DIAGNOSIS — I10 Essential (primary) hypertension: Secondary | ICD-10-CM | POA: Insufficient documentation

## 2019-05-08 DIAGNOSIS — Z7982 Long term (current) use of aspirin: Secondary | ICD-10-CM | POA: Insufficient documentation

## 2019-05-08 DIAGNOSIS — R10A0 Flank pain, unspecified side: Secondary | ICD-10-CM

## 2019-05-08 DIAGNOSIS — R1031 Right lower quadrant pain: Secondary | ICD-10-CM | POA: Diagnosis not present

## 2019-05-08 DIAGNOSIS — I714 Abdominal aortic aneurysm, without rupture: Secondary | ICD-10-CM | POA: Diagnosis not present

## 2019-05-08 DIAGNOSIS — E119 Type 2 diabetes mellitus without complications: Secondary | ICD-10-CM | POA: Insufficient documentation

## 2019-05-08 DIAGNOSIS — Z7984 Long term (current) use of oral hypoglycemic drugs: Secondary | ICD-10-CM | POA: Diagnosis not present

## 2019-05-08 DIAGNOSIS — Z87891 Personal history of nicotine dependence: Secondary | ICD-10-CM | POA: Insufficient documentation

## 2019-05-08 DIAGNOSIS — R7989 Other specified abnormal findings of blood chemistry: Secondary | ICD-10-CM | POA: Diagnosis not present

## 2019-05-08 DIAGNOSIS — R1030 Lower abdominal pain, unspecified: Secondary | ICD-10-CM | POA: Diagnosis not present

## 2019-05-08 DIAGNOSIS — Z79899 Other long term (current) drug therapy: Secondary | ICD-10-CM | POA: Insufficient documentation

## 2019-05-08 LAB — URINALYSIS, ROUTINE W REFLEX MICROSCOPIC
Glucose, UA: NEGATIVE mg/dL
Hgb urine dipstick: NEGATIVE
Ketones, ur: NEGATIVE mg/dL
Leukocytes,Ua: NEGATIVE
Nitrite: NEGATIVE
Protein, ur: NEGATIVE mg/dL
Specific Gravity, Urine: 1.01 (ref 1.005–1.030)
pH: 5.5 (ref 5.0–8.0)

## 2019-05-08 LAB — COMPREHENSIVE METABOLIC PANEL
ALT: 21 U/L (ref 0–44)
AST: 20 U/L (ref 15–41)
Albumin: 2.7 g/dL — ABNORMAL LOW (ref 3.5–5.0)
Alkaline Phosphatase: 74 U/L (ref 38–126)
Anion gap: 12 (ref 5–15)
BUN: 34 mg/dL — ABNORMAL HIGH (ref 8–23)
CO2: 21 mmol/L — ABNORMAL LOW (ref 22–32)
Calcium: 8.1 mg/dL — ABNORMAL LOW (ref 8.9–10.3)
Chloride: 99 mmol/L (ref 98–111)
Creatinine, Ser: 1.85 mg/dL — ABNORMAL HIGH (ref 0.61–1.24)
GFR calc Af Amer: 40 mL/min — ABNORMAL LOW (ref >60)
GFR calc non Af Amer: 34 mL/min — ABNORMAL LOW (ref >60)
Glucose, Bld: 152 mg/dL — ABNORMAL HIGH (ref 70–99)
Potassium: 4.5 mmol/L (ref 3.5–5.1)
Sodium: 132 mmol/L — ABNORMAL LOW (ref 135–145)
Total Bilirubin: 0.6 mg/dL (ref 0.3–1.2)
Total Protein: 6.6 g/dL (ref 6.5–8.1)

## 2019-05-08 LAB — CBC WITH DIFFERENTIAL/PLATELET
Abs Immature Granulocytes: 0.17 10*3/uL — ABNORMAL HIGH (ref 0.00–0.07)
Basophils Absolute: 0.1 10*3/uL (ref 0.0–0.1)
Basophils Relative: 1 %
Eosinophils Absolute: 2 10*3/uL — ABNORMAL HIGH (ref 0.0–0.5)
Eosinophils Relative: 11 %
HCT: 38.6 % — ABNORMAL LOW (ref 39.0–52.0)
Hemoglobin: 12.5 g/dL — ABNORMAL LOW (ref 13.0–17.0)
Immature Granulocytes: 1 %
Lymphocytes Relative: 11 %
Lymphs Abs: 2 10*3/uL (ref 0.7–4.0)
MCH: 29.6 pg (ref 26.0–34.0)
MCHC: 32.4 g/dL (ref 30.0–36.0)
MCV: 91.3 fL (ref 80.0–100.0)
Monocytes Absolute: 1.9 10*3/uL — ABNORMAL HIGH (ref 0.1–1.0)
Monocytes Relative: 11 %
Neutro Abs: 11.6 10*3/uL — ABNORMAL HIGH (ref 1.7–7.7)
Neutrophils Relative %: 65 %
Platelets: 236 10*3/uL (ref 150–400)
RBC: 4.23 MIL/uL (ref 4.22–5.81)
RDW: 13.9 % (ref 11.5–15.5)
WBC: 17.7 10*3/uL — ABNORMAL HIGH (ref 4.0–10.5)
nRBC: 0 % (ref 0.0–0.2)

## 2019-05-08 LAB — PROTIME-INR
INR: 1.6 — ABNORMAL HIGH (ref 0.8–1.2)
Prothrombin Time: 18.4 seconds — ABNORMAL HIGH (ref 11.4–15.2)

## 2019-05-08 LAB — LACTIC ACID, PLASMA: Lactic Acid, Venous: 1.1 mmol/L (ref 0.5–1.9)

## 2019-05-08 MED ORDER — ONDANSETRON HCL 4 MG/2ML IJ SOLN
4.0000 mg | Freq: Once | INTRAMUSCULAR | Status: AC
  Administered 2019-05-08: 17:00:00 4 mg via INTRAVENOUS
  Filled 2019-05-08: qty 2

## 2019-05-08 MED ORDER — HYDROMORPHONE HCL 1 MG/ML IJ SOLN
0.5000 mg | Freq: Once | INTRAMUSCULAR | Status: AC
  Administered 2019-05-08: 17:00:00 0.5 mg via INTRAVENOUS
  Filled 2019-05-08: qty 1

## 2019-05-08 MED ORDER — SODIUM CHLORIDE 0.9 % IV BOLUS
1000.0000 mL | Freq: Once | INTRAVENOUS | Status: AC
  Administered 2019-05-08: 17:00:00 1000 mL via INTRAVENOUS

## 2019-05-08 MED ORDER — HYDROCODONE-ACETAMINOPHEN 5-325 MG PO TABS: ORAL_TABLET | ORAL | 0 refills | Status: DC

## 2019-05-08 MED ORDER — IOHEXOL 350 MG/ML SOLN
80.0000 mL | Freq: Once | INTRAVENOUS | Status: AC | PRN
  Administered 2019-05-08: 80 mL via INTRAVENOUS

## 2019-05-08 MED ORDER — HYDROMORPHONE HCL 1 MG/ML IJ SOLN
0.5000 mg | Freq: Once | INTRAMUSCULAR | Status: AC
  Administered 2019-05-08: 20:00:00 0.5 mg via INTRAVENOUS
  Filled 2019-05-08: qty 1

## 2019-05-08 NOTE — ED Notes (Signed)
Pt ambulated to car with ED staff and family member

## 2019-05-08 NOTE — ED Notes (Signed)
Pt reports constant severe low back pain x 2 weeks. He states he was discharged from hospital on Monday (admitted for pneumonia and afib)

## 2019-05-08 NOTE — ED Notes (Signed)
Patient transported to CT 

## 2019-05-08 NOTE — ED Notes (Signed)
Family at bedside. 

## 2019-05-08 NOTE — ED Notes (Signed)
Pt advised this RN that he was about to pass out from the pain. Pt re-evaluated and V/S as noted. Pt placed in view of this RN in the waiting room and advised of the current wait times.

## 2019-05-08 NOTE — ED Notes (Signed)
ED Provider at bedside. 

## 2019-05-08 NOTE — ED Notes (Signed)
ED Provider at bedside. Josh EDPA

## 2019-05-08 NOTE — ED Provider Notes (Signed)
[facility] EMERGENCY DEPARTMENT Provider Note   CSN: [id number] Arrival date & time: 05/08/19  1443     History   Chief Complaint Chief Complaint  Patient presents with  . Back Pain    HPI Brian French. is a 78 y.o. male.     Patient with history of diabetes, hyperlipidemia, hypertension, recent admission for right-sided community-acquired pneumonia with parapneumonic effusion status post paracentesis as well as question of pyelonephritis (however urine culture did not have any growth) --presents to the emergency department with P back pain.  Patient states that the pain has been ongoing since his previous hospitalization.  Wife states that he did not really feel much better after being discharged from the hospital.  Patient has also followed up with the [place].  He was on Levaquin.  Cefdinir was recently added.  Patient was also found to be in atrial fibrillation with rapid ventricular response as an inpatient and was started on Eliquis at the time of his hospitalization.  Patient denies any fevers, chest pain, shortness of breath.  He states that he feels very lightheaded with severe pain.     Past Medical History:  Diagnosis Date  . Diabetes mellitus without complication ([place])   . Headache disorder 05/22/2015  . Hyperlipidemia   . Hypertension     Patient Active Problem List   Diagnosis Date Noted  . Atrial fibrillation with RVR ([place]) 04/30/2019  . Acute pyelonephritis 04/30/2019  . Parapneumonic effusion 04/30/2019  . DM2 (diabetes mellitus, type 2) ([place]) 04/30/2019  . Community acquired pneumonia of right upper lobe of lung 04/29/2019  . Vertigo 05/22/2015  . Headache disorder 05/22/2015    Past Surgical History:  Procedure Laterality Date  . IR THORACENTESIS ASP PLEURAL SPACE W/IMG GUIDE  04/30/2019  . right testicular swelling     had I and D after swelling s/p trauma from scrotal  swelling, got hit in the testes during judo [place]  Medications    Prior to Admission medications   Medication Sig Start Date End Date Taking? Authorizing Provider  acetaminophen (TYLENOL) 500 MG tablet Take 1,000 mg by mouth every 6 (six) hours as needed for mild pain or headache.    [provider]  apixaban (ELIQUIS) 5 MG TABS tablet Take 1 tablet (5 mg total) by mouth 2 (two) times daily. 05/03/19 06/02/19  Cherylann Ratel A, DO  aspirin EC 81 MG tablet Take 81 mg by mouth daily.    [provider]  atorvastatin (LIPITOR) 80 MG tablet Take 80 mg by mouth daily at 6 PM.    [provider]  cefdinir (OMNICEF) 300 MG capsule Take 1 capsule (300 mg total) by mouth every 12 (twelve) hours for 7 days. 05/03/19 05/10/19  Cherylann Ratel A, DO  cyclobenzaprine (FLEXERIL) 10 MG tablet Take 1 tablet (10 mg total) by mouth 3 (three) times daily as needed for muscle spasms. 04/25/19   Gareth Morgan, MD  diltiazem (CARDIZEM CD) 120 MG 24 hr capsule Take 1 capsule (120 mg total) by mouth daily. 05/03/19 05/02/20  Cherylann Ratel A, DO  glipiZIDE (GLUCOTROL) 10 MG tablet Take 10 mg by mouth 2 (two) times daily before a meal.    [provider]  lidocaine (LIDODERM) 5 % Place 1 patch onto the skin daily. Remove & Discard patch within 12 hours or as directed by MD 04/25/19   Gareth Morgan, MD  memantine (NAMENDA) 10 MG tablet Take 10 mg  by mouth at bedtime.    [provider]  metFORMIN (GLUCOPHAGE) 500 MG tablet Take 500 mg by mouth 2 (two) times daily with a meal.     [provider]  Multiple Vitamin (MULTIVITAMIN WITH MINERALS) TABS tablet Take 1 tablet by mouth daily.    [provider]  Omega-3 Fatty Acids (FISH OIL) 1000 MG CAPS Take 2,000 mg by mouth 2 (two) times daily.    [provider]  omeprazole (PRILOSEC) 20 MG capsule Take 20 mg by mouth daily.    [provider]  pioglitazone (ACTOS) 45 MG tablet Take 45 mg by mouth daily.    [provider]  tamsulosin  (FLOMAX) 0.4 MG CAPS capsule Take 0.4 mg by mouth daily.    [provider]    Family History Family History  Problem Relation Age of Onset  . Cancer Mother   . Heart disease Father     Social History Social History   Tobacco Use  . Smoking status: Former Smoker    Packs/day: 2.00    Types: Cigarettes    Quit date: 07/15/1997    Years since quitting: 21.8  . Smokeless tobacco: Never Used  Substance Use Topics  . Alcohol use: Yes    Alcohol/week: 0.0 standard drinks    Comment: occasionally  . Drug use: Never     Allergies   Patient has no known allergies.   Review of Systems Review of Systems  Constitutional: Positive for appetite change. Negative for chills and fever.  HENT: Negative for rhinorrhea and sore throat.   Eyes: Negative for redness.  Respiratory: Negative for cough.   Cardiovascular: Negative for chest pain.  Gastrointestinal: Negative for abdominal pain, diarrhea, nausea and vomiting.  Genitourinary: Positive for decreased urine volume and flank pain. Negative for dysuria.  Musculoskeletal: Negative for myalgias.  Skin: Negative for rash.  Neurological: Negative for headaches.    Physical Exam Updated Vital Signs BP 105/88 (BP Location: Left Arm)   Pulse 81   Temp 97.6 F (36.4 C) (Oral)   Resp 20   Ht 5\' 11"  (1.803 m)   Wt 104.3 kg   SpO2 96%   BMI 32.08 kg/m   Physical Exam Vitals signs and nursing note reviewed.  Constitutional:      Appearance: He is well-developed.  HENT:     Head: Normocephalic and atraumatic.  Eyes:     General:        Right eye: No discharge.        Left eye: No discharge.     Conjunctiva/sclera: Conjunctivae normal.  Neck:     Musculoskeletal: Normal range of motion and neck supple.  Cardiovascular:     Rate and Rhythm: Normal rate and regular rhythm.     Heart sounds: Normal heart sounds.  Pulmonary:     Effort: Pulmonary effort is normal.     Breath sounds: Normal breath sounds. No wheezing,  rhonchi or rales.  Abdominal:     Palpations: Abdomen is soft.     Tenderness: There is no abdominal tenderness. There is right CVA tenderness. There is no left CVA tenderness, guarding or rebound.  Skin:    General: Skin is warm and dry.  Neurological:     Mental Status: He is alert.      ED Treatments / Results  Labs (all labs ordered are listed, but only abnormal results are displayed) Labs Reviewed  CBC WITH DIFFERENTIAL/PLATELET - Abnormal; Notable for the following components:  Result Value   WBC 17.7 (*)    Hemoglobin 12.5 (*)    HCT 38.6 (*)    Neutro Abs 11.6 (*)    Monocytes Absolute 1.9 (*)    Eosinophils Absolute 2.0 (*)    Abs Immature Granulocytes 0.17 (*)    All other components within normal limits  COMPREHENSIVE METABOLIC PANEL - Abnormal; Notable for the following components:   Sodium 132 (*)    CO2 21 (*)    Glucose, Bld 152 (*)    BUN 34 (*)    Creatinine, Ser 1.85 (*)    Calcium 8.1 (*)    Albumin 2.7 (*)    GFR calc non Af Amer 34 (*)    GFR calc Af Amer 40 (*)    All other components within normal limits  PROTIME-INR - Abnormal; Notable for the following components:   Prothrombin Time 18.4 (*)    INR 1.6 (*)    All other components within normal limits  URINALYSIS, ROUTINE W REFLEX MICROSCOPIC - Abnormal; Notable for the following components:   Bilirubin Urine SMALL (*)    All other components within normal limits  URINE CULTURE  LACTIC ACID, PLASMA    EKG None  Radiology Ct Angio Abd/pel W And/or Wo Contrast  Result Date: 05/08/2019 CLINICAL DATA:  Question ischemia or renal infarct, recent diagnosis of atrial fibrillation, recent abnormal CT with persistent right abdominal pain. EXAM: CTA ABDOMEN AND PELVIS WITHOUT AND WITH CONTRAST TECHNIQUE: Multidetector CT imaging of the abdomen and pelvis was performed using the standard protocol during bolus administration of intravenous contrast. Multiplanar reconstructed images and MIPs were  obtained and reviewed to evaluate the vascular anatomy. CONTRAST:  31mL OMNIPAQUE IOHEXOL 350 MG/ML SOLN COMPARISON:  CT 04/29/2019 FINDINGS: VASCULAR Aorta: Mixture of calcified noncalcified atherosclerotic plaque throughout the abdominal aorta. There is fusiform abdominal aortic aneurysm measuring up to 3 cm in maximal diameter. No periaortic stranding. No occlusion or stenosis. Celiac: Ostial plaque results in mild narrowing. Minimal plaque within the proximal vessel without significant stenosis. No evidence of aneurysm, dissection or vasculitis. SMA: Ostial plaque results and mild narrowing. Minimal atheromatous plaque in the proximal vessel. No significant stenosis. No evidence of aneurysm, dissection or vasculitis. Renals: Single renal arteries bilaterally. Plaque results in at least moderate narrowing of the proximal right renal artery. Normal distal opacification. Ostial plaque results and mild narrowing of the left renal artery as well. IMA: IMA arises from the fusiform abdominal aortic aneurysm. No evidence of aneurysm, dissection or vasculitis. Inflow: No extension of the aneurysm into the proximal inflow. Calcified noncalcified plaque seen throughout the inflow vessels. No aneurysm, dissection, vasculitis or significant stenosis. Proximal Outflow: Plaque noted in the common femoral arteries and at the level of the bifurcations without significant stenosis, aneurysm or dissection Veins: Venous phase imaging reveals no significant venous abnormality. Portal and hepatic veins are patent. Review of the MIP images confirms the above findings. NON-VASCULAR Lower chest: Redemonstration of a moderate right pleural effusion with adjacent areas of passive atelectasis in the right lung base. No abnormal pleural thickening or enhancement. Small volume pericardial effusion. Cardiac size is within normal limits. Hepatobiliary: No focal liver abnormality is seen. No gallstones, gallbladder wall thickening, or biliary  dilatation. Pancreas: Fatty replacement of the pancreas. No pancreatic ductal dilatation or surrounding inflammatory changes. Spleen: Normal in size without focal abnormality. Adrenals/Urinary Tract: Stable appearance of the 1.6 cm left adrenal nodule. Right adrenal gland is unremarkable. Exophytic left renal cysts are similar to prior  exam. There is moderate bilateral perinephric stranding and low-attenuation striations of the bilateral renal parenchyma. Ill-defined rounded hypoattenuating lesions involving both kidneys are suspicious for lobar nephronia or developing abscess, less likely infarct in the setting of additional inflammation and infectious features. Right extrarenal pelvis. Urothelial thickening of both collecting systems. Circumferential thickening of the urinary bladder with bladder wall trabeculations. Stomach/Bowel: Distal esophagus, stomach and duodenal sweep are unremarkable. No small bowel wall thickening or dilatation. No evidence of obstruction. A normal appendix is visualized. No colonic dilatation or wall thickening. Lymphatic: No suspicious or enlarged lymph nodes in the included lymphatic chains. Reproductive: Borderline prostatomegaly. Other: No abdominopelvic free fluid or free gas. No bowel containing hernias. Small fat containing inguinal hernias. Musculoskeletal: Multilevel degenerative changes are present in the imaged portions of the spine. Findings most pronounced at L3-4 with a calcified disc bulge resulting in moderate canal stenosis. Features likely exacerbated by what appear to be congenitally short pedicles. No acute osseous abnormality or suspicious osseous lesion. IMPRESSION: VASCULAR 1. Aortic Atherosclerosis (ICD10-I70.0). 2. Ostial plaque results in mild narrowing of the celiac, SMA, and left renal artery origins. 3. Moderate segmental narrowing of the proximal right renal artery secondary to atheromatous plaque. 4. Infrarenal abdominal aortic aneurysm measuring up 3 cm.  Recommend followup by ultrasound in 3 years. This recommendation follows ACR consensus guidelines: White Paper of the ACR Incidental Findings Committee II on Vascular Findings. J Am Coll Radiol 2013; 10:789-794. Aortic aneurysm NOS (ICD10-I71.9) NON-VASCULAR 1. Bilateral perinephric and periureteral stranding with urothelial thickening, nephrographic striations and ill-defined rounded hypoattenuating lesions involving both kidneys, suspicious for lobar nephronia or developing abscess, less likely infarct given the additional features supporting an ascending urinary tract infection. 2. Circumferential thickening of the urinary bladder with bladder wall trabeculations, consistent with chronic outlet obstruction or neurogenic bladder. 3. Moderate right pleural effusion with adjacent areas of passive atelectasis in the right lung base. 4. Small volume pericardial effusion. These results were called by telephone at the time of interpretation on 05/08/2019 at 7:23 pm to provider [facility] , who verbally acknowledged these results. Electronically Signed   By: Lovena Le M.D.   On: 05/08/2019 19:24    Procedures Procedures (including critical care time)  Medications Ordered in ED Medications  ondansetron (ZOFRAN) injection 4 mg (4 mg Intravenous Given 05/08/19 1631)  HYDROmorphone (DILAUDID) injection 0.5 mg (0.5 mg Intravenous Given 05/08/19 1632)  sodium chloride 0.9 % bolus 1,000 mL (0 mLs Intravenous Stopped 05/08/19 1905)  iohexol (OMNIPAQUE) 350 MG/ML injection 80 mL (80 mLs Intravenous Contrast Given 05/08/19 1755)  HYDROmorphone (DILAUDID) injection 0.5 mg (0.5 mg Intravenous Given 05/08/19 1945)     Initial Impression / Assessment and Plan / ED Course  I have reviewed the triage vital signs and the nursing notes.  Pertinent labs & imaging results that were available during my care of the patient were reviewed by me and considered in my medical decision making (see chart for details).         Patient seen and examined. Work-up initiated. Medications ordered.    Vital signs reviewed and are as follows: BP 112/74   Pulse 79   Temp 97.6 F (36.4 C) (Oral)   Resp 19   Ht 5\' 11"  (1.803 m)   Wt 104.3 kg   SpO2 96%   BMI 32.08 kg/m   Patient required multiple doses of pain medication.  Patient discussed with Dr. Laverta Baltimore who has seen patient.  We reviewed patient's previous hospitalization and imaging findings.  Decision made to perform CT angio of the abdomen to evaluate for embolic/ischemic cause of the patient's pain.  Results were discussed with the radiologist.  Patient with a more infectious appearing CT scan.  Patient with inflammation but no discrete abscess.  Less likely renal infarcts.  Patient is also now on anticoagulation to help protect against this.  UA demonstrates no significant signs of infection.  I discussed findings with Dr. Tresa Moore of urology.  Agrees no need for surgical intervention at this time.  He has reviewed CT imaging as well.  Encourage symptom control and outpatient follow-up at this point.  Patient does not want to be admitted to the hospital for any reason.  I do not see any compelling indications for admission at this point.  He does have several issues which will need to be monitored.  Patient reports that he has an appointment at the [facility] in [place] in 4 days with his primary care doctor.  Patient with continued right lung effusion.  No signs of hypoxia today.  Patient does have slightly increased white count compared to previous.  No longer on antibiotics for this.  This will need to be monitored and rechecked.  Chronic kidney disease: Creatinine today is slightly worse than baseline.  Patient was given a liter of IV fluids.  He will need to have this rechecked and is aware that he needs to have this rechecked.  Possible pyelonephritis: Demonstrated on CT scan.  No drainable abscess at this time.  This is likely underlying etiology of the patient's  flank pain which is now better controlled.  Will discharge to home with a short course of pain medication to use as needed.  We discussed precautions when using these medications.  Patient will need to have these findings followed up as an outpatient as well.  Final Clinical Impressions(s) / ED Diagnoses   Final diagnoses:  Flank pain  Elevated serum creatinine   Patient with problems as above.  His main complaint tonight is flank pain.  He is afebrile and does not appear septic.  Normal lactate.  ED Discharge Orders         Ordered    HYDROcodone-acetaminophen (NORCO/VICODIN) 5-325 MG tablet     05/08/19 2038           Carlisle Cater, PA-C 05/08/19 2117    Margette Fast, MD 05/09/19 1001

## 2019-05-08 NOTE — ED Notes (Signed)
Pt self cath's for urine.

## 2019-05-08 NOTE — Discharge Instructions (Signed)
Please read and follow all provided instructions.  Your diagnoses today include:  1. Flank pain   2. Elevated serum creatinine     Tests performed today include:  Blood counts and electrolytes - slightly worse kidney function  Blood tests to check liver and kidney function  Blood tests to check pancreas function  Urine test to look for infection  Vital signs. See below for your results today.   Medications prescribed:   Vicodin (hydrocodone/acetaminophen) - narcotic pain medication  DO NOT drive or perform any activities that require you to be awake and alert because this medicine can make you drowsy. BE VERY CAREFUL not to take multiple medicines containing Tylenol (also called acetaminophen). Doing so can lead to an overdose which can damage your liver and cause liver failure and possibly death.  Take any prescribed medications only as directed.  Home care instructions:   Follow any educational materials contained in this packet.  Follow-up instructions: Please follow-up with your primary care provider in the next 3-5 days for further evaluation of your symptoms.    Return instructions:  SEEK IMMEDIATE MEDICAL ATTENTION IF:  The pain does not go away or becomes severe   A temperature above 101F develops   Repeated vomiting occurs (multiple episodes)   The pain becomes localized to portions of the abdomen. The right side could possibly be appendicitis. In an adult, the left lower portion of the abdomen could be colitis or diverticulitis.   Blood is being passed in stools or vomit (bright red or black tarry stools)   You develop chest pain, difficulty breathing, dizziness or fainting, or become confused, poorly responsive, or inconsolable (young children)  If you have any other emergent concerns regarding your health  Additional Information: Abdominal (belly) pain can be caused by many things. Your caregiver performed an examination and possibly ordered blood/urine  tests and imaging (CT scan, x-rays, ultrasound). Many cases can be observed and treated at home after initial evaluation in the emergency department. Even though you are being discharged home, abdominal pain can be unpredictable. Therefore, you need a repeated exam if your pain does not resolve, returns, or worsens. Most patients with abdominal pain don't have to be admitted to the hospital or have surgery, but serious problems like appendicitis and gallbladder attacks can start out as nonspecific pain. Many abdominal conditions cannot be diagnosed in one visit, so follow-up evaluations are very important.  Your vital signs today were: BP 116/63    Pulse 75    Temp 97.6 F (36.4 C) (Oral)    Resp 15    Ht 5\' 11"  (1.803 m)    Wt 104.3 kg    SpO2 95%    BMI 32.08 kg/m  If your blood pressure (bp) was elevated above 135/85 this visit, please have this repeated by your doctor within one month. --------------

## 2019-05-08 NOTE — ED Triage Notes (Signed)
R low back pain x 2 weeks. Was seen here for same but no relief.

## 2019-05-10 LAB — URINE CULTURE: Culture: 50000 — AB

## 2019-05-11 ENCOUNTER — Telehealth: Payer: Self-pay | Admitting: *Deleted

## 2019-05-11 NOTE — Telephone Encounter (Signed)
Post ED Visit - Positive Culture Follow-up  Culture report reviewed by antimicrobial stewardship pharmacist: [facility] Team []  Elenor Quinones, Pharm.D. []  Heide Guile, Pharm.D., BCPS AQ-ID []  Parks Neptune, Pharm.D., BCPS []  Alycia Rossetti, Pharm.D., BCPS []  [place], [place].D., BCPS, AAHIVP []  Legrand Como, Pharm.D., BCPS, AAHIVP [x]  Salome Arnt, PharmD, BCPS []  Johnnette Gourd, PharmD, BCPS []  Hughes Better, PharmD, BCPS []  Leeroy Cha, PharmD []  Laqueta Linden, PharmD, BCPS []  Albertina Parr, PharmD  [place] Team []  Leodis Sias, PharmD []  Lindell Spar, PharmD []  Royetta Asal, PharmD []  Graylin Shiver, Rph []  Rema Fendt) Glennon Mac, PharmD []  Arlyn Dunning, PharmD []  Netta Cedars, PharmD []  Dia Sitter, PharmD []  Leone Haven, PharmD []  Gretta Arab, PharmD []  Theodis Shove, PharmD []  Peggyann Juba, PharmD []  Reuel Boom, PharmD   Positive urine culture Likely contaminant and no further patient follow-up is required at this time.  Harlon Flor [facility] 05/11/2019, 10:34 AM

## 2019-05-13 ENCOUNTER — Encounter (HOSPITAL_COMMUNITY): Payer: Self-pay | Admitting: Emergency Medicine

## 2019-05-13 ENCOUNTER — Other Ambulatory Visit: Payer: Self-pay

## 2019-05-13 ENCOUNTER — Emergency Department (HOSPITAL_COMMUNITY)
Admission: EM | Admit: 2019-05-13 | Discharge: 2019-05-13 | Disposition: A | Discharge status: Home / Self Care | Payer: No Typology Code available for payment source | Attending: Emergency Medicine | Admitting: Emergency Medicine

## 2019-05-13 DIAGNOSIS — Z87891 Personal history of nicotine dependence: Secondary | ICD-10-CM | POA: Diagnosis not present

## 2019-05-13 DIAGNOSIS — Z79899 Other long term (current) drug therapy: Secondary | ICD-10-CM | POA: Insufficient documentation

## 2019-05-13 DIAGNOSIS — I10 Essential (primary) hypertension: Secondary | ICD-10-CM | POA: Insufficient documentation

## 2019-05-13 DIAGNOSIS — G8929 Other chronic pain: Secondary | ICD-10-CM | POA: Diagnosis not present

## 2019-05-13 DIAGNOSIS — L270 Generalized skin eruption due to drugs and medicaments taken internally: Secondary | ICD-10-CM | POA: Insufficient documentation

## 2019-05-13 DIAGNOSIS — M545 Low back pain: Secondary | ICD-10-CM

## 2019-05-13 DIAGNOSIS — E119 Type 2 diabetes mellitus without complications: Secondary | ICD-10-CM | POA: Insufficient documentation

## 2019-05-13 DIAGNOSIS — R21 Rash and other nonspecific skin eruption: Secondary | ICD-10-CM | POA: Diagnosis not present

## 2019-05-13 DIAGNOSIS — Z7984 Long term (current) use of oral hypoglycemic drugs: Secondary | ICD-10-CM | POA: Diagnosis not present

## 2019-05-13 DIAGNOSIS — Z7982 Long term (current) use of aspirin: Secondary | ICD-10-CM | POA: Insufficient documentation

## 2019-05-13 NOTE — ED Provider Notes (Signed)
[facility] DEPT Provider Note   CSN: [id number] Arrival date & time: 05/13/19  1623     History   Chief Complaint Back pain Rash  HPI Brian French. is a 78 y.o. male with a history of pyelonephritis, recently on antibiotics, diabetes, 3 cm AAA (infrarenal, as imaged most recently 05/08/2019 on CTA), A. fib on Eliquis, presenting to the emergency department with back pain.  Patient reports this is the same back pain he has been suffering from for at least 1 month.  He was hospitalized earlier this month for this pain and was diagnosed with acute pyelonephritis and CAP.  He was started on and completed a 10-day course of initially Levaquin, then switched to cefdinir.  He reports he continues to have pain in his right lower back.  The pain is improved with lidocaine patches which his wife have been applying daily.  The pain is sometimes positional.  It is not constant.  It comes and goes.  Today, he came to the emergency department because the patient began having significant pain again in his right lower side.  He and his wife called their doctor's office at the [facility] hospital and were told to come to [facility] emergency department for a "work-up".  [facility] is unclear what the [facility].  The patient and his wife state they do not know what the [facility]'s concerns were.  They state the [place] staff told them they would call over and fax over information, unfortunately, no information was received by the staff at [facility].  Separate from the back pain, the patient also complains of a diffuse itchy rash which began approximately 3 days ago.  He says the rash is all over his trunk and legs and back.  There are small red macular pruritic rash.  He denies any lesions on his tongue.  He denies any sloughing of the skin.  This has never happened before.    He and his wife report he was started on a "bunch of different meds" but cannot remember which were the new  medications.  He denies fevers, chills, headache, LOC.     HPI  Past Medical History:  Diagnosis Date  . Diabetes mellitus without complication ([place])   . Headache disorder 05/22/2015  . Hyperlipidemia   . Hypertension     Patient Active Problem List   Diagnosis Date Noted  . Atrial fibrillation with RVR ([place]) 04/30/2019  . Acute pyelonephritis 04/30/2019  . Parapneumonic effusion 04/30/2019  . DM2 (diabetes mellitus, type 2) ([place]) 04/30/2019  . Community acquired pneumonia of right upper lobe of lung 04/29/2019  . Vertigo 05/22/2015  . Headache disorder 05/22/2015    Past Surgical History:  Procedure Laterality Date  . IR THORACENTESIS ASP PLEURAL SPACE W/IMG GUIDE  04/30/2019  . right testicular swelling     had I and D after swelling s/p trauma from scrotal  swelling, got hit in the testes during judo [place] Medications    Prior to Admission medications   Medication Sig Start Date End Date Taking? Authorizing Provider  acetaminophen (TYLENOL) 500 MG tablet Take 1,000 mg by mouth every 6 (six) hours as needed for mild pain or headache.    [provider]  apixaban (ELIQUIS) 5 MG TABS tablet Take 1 tablet (5 mg total) by mouth 2 (two) times daily. 05/03/19 06/02/19  Cherylann Ratel A, DO  aspirin EC 81 MG tablet Take 81  mg by mouth daily.    [provider]  atorvastatin (LIPITOR) 80 MG tablet Take 80 mg by mouth daily at 6 PM.    [provider]  cyclobenzaprine (FLEXERIL) 10 MG tablet Take 1 tablet (10 mg total) by mouth 3 (three) times daily as needed for muscle spasms. 04/25/19   Gareth Morgan, MD  diltiazem (CARDIZEM CD) 120 MG 24 hr capsule Take 1 capsule (120 mg total) by mouth daily. 05/03/19 05/02/20  Cherylann Ratel A, DO  glipiZIDE (GLUCOTROL) 10 MG tablet Take 10 mg by mouth 2 (two) times daily before a meal.    [provider]  HYDROcodone-acetaminophen (NORCO/VICODIN) 5-325 MG tablet Take 1-2 tablets every 6 hours  as needed for severe pain 05/08/19   Carlisle Cater, PA-C  lidocaine (LIDODERM) 5 % Place 1 patch onto the skin daily. Remove & Discard patch within 12 hours or as directed by MD 04/25/19   Gareth Morgan, MD  memantine (NAMENDA) 10 MG tablet Take 10 mg by mouth at bedtime.    [provider]  metFORMIN (GLUCOPHAGE) 500 MG tablet Take 500 mg by mouth 2 (two) times daily with a meal.     [provider]  Multiple Vitamin (MULTIVITAMIN WITH MINERALS) TABS tablet Take 1 tablet by mouth daily.    [provider]  Omega-3 Fatty Acids (FISH OIL) 1000 MG CAPS Take 2,000 mg by mouth 2 (two) times daily.    [provider]  omeprazole (PRILOSEC) 20 MG capsule Take 20 mg by mouth daily.    [provider]  pioglitazone (ACTOS) 45 MG tablet Take 45 mg by mouth daily.    [provider]  tamsulosin (FLOMAX) 0.4 MG CAPS capsule Take 0.4 mg by mouth daily.    [provider]    Family History Family History  Problem Relation Age of Onset  . Cancer Mother   . Heart disease Father     Social History Social History   Tobacco Use  . Smoking status: Former Smoker    Packs/day: 2.00    Types: Cigarettes    Quit date: 07/15/1997    Years since quitting: 21.8  . Smokeless tobacco: Never Used  Substance Use Topics  . Alcohol use: Yes    Alcohol/week: 0.0 standard drinks    Comment: occasionally  . Drug use: Never     Allergies   Patient has no known allergies.   Review of Systems Review of Systems  Constitutional: Negative for chills and fever.  Eyes: Negative for photophobia and visual disturbance.  Respiratory: Negative for cough and shortness of breath.   Cardiovascular: Negative for chest pain and palpitations.  Gastrointestinal: Negative for abdominal pain, nausea and vomiting.  Genitourinary: Negative for dysuria and hematuria.  Musculoskeletal: Positive for arthralgias, back pain and myalgias.  Skin: Positive for rash.  Negative for wound.  Neurological: Negative for syncope, light-headedness and headaches.  Psychiatric/Behavioral: Negative for agitation and confusion.  All other systems reviewed and are negative.    Physical Exam Updated Vital Signs BP 135/74   Pulse 95   Temp 97.7 F (36.5 C) (Oral)   Resp 18   Ht 5\' 11"  (1.803 m)   SpO2 97%   BMI 32.08 kg/m   Physical Exam Vitals signs and nursing note reviewed.  Constitutional:      Appearance: He is well-developed.  HENT:     Head: Normocephalic and atraumatic.  Eyes:     Conjunctiva/sclera: Conjunctivae normal.  Neck:  Musculoskeletal: Neck supple.  Cardiovascular:     Rate and Rhythm: Normal rate and regular rhythm.     Pulses: Normal pulses.  Pulmonary:     Effort: Pulmonary effort is normal. No respiratory distress.  Abdominal:     General: There is no distension or abdominal bruit.     Palpations: Abdomen is soft.     Tenderness: There is no abdominal tenderness.  Musculoskeletal:     Comments: Lidocaine patch over right lower back TTP of the lower right lower back No midline spinal ttp  Skin:    General: Skin is warm and dry.     Comments: Diffuse macular rash involving the chest, upper arms, abdomen, back, and upper legs No purpura Rash is pruritic  Neurological:     Mental Status: He is alert.      ED Treatments / Results  Labs (all labs ordered are listed, but only abnormal results are displayed) Labs Reviewed  URINE CULTURE    EKG None  Radiology No results found.  Procedures Procedures (including critical care time)  Medications Ordered in ED Medications - No data to display   Initial Impression / Assessment and Plan / ED Course  I have reviewed the triage vital signs and the nursing notes.  Pertinent labs & imaging results that were available during my care of the patient were reviewed by me and considered in my medical decision making (see chart for details).  77 year old male  presenting to the emergency department with continued complaint of right lower back pain.  He does self cath due to BPH.  I advised that we recheck his urine for signs of infection to ensure that the antibiotics have cleared his urinary tract infection.  He has no signs or symptoms of sepsis on my exam.  His back pain appears to be chronic.  It responds favorably to lidocaine patch, which makes muscular pain more likely as an etiology, and less likely acute spinal pathology, spinal infection, or intra-abdominal process.  Low suspicion for ruptured AAA give size (3 cm measured less than 1 week ago) and benign exam.  Regarding his rash, this is most likely a drug reaction, given its morbilliform appearance. The rash is pruritic which makes this less likely petechial.  He has no recent history to suggest infectious/tick-borne etiology.  Unfortunately they cannot recall which medications he began taking recently - although any medications in the past several weeks could be a potential culprit.  I advised that they call their prescribing doctor tomorrow to discuss new medications and cessation of unnessary medications.  Otherwise I recommended supportive care measures, including calamine lotion and benadryl for itching at night.  I do not see an emergent indication for repeat bloodwork at this time.  Finally, the patient and his wife express some frustrations over the multitude of specialists and doctors they are seeing.  They feel that they sometimes receive conflicting advice.  I told them I understand their frustrations, and advised that they explain this to their primary care doctor, who should be coordinating all of their care.  Clinical Course as of May 13 56  Thu May 13, 2019  2101 Pt refused to provide urine sample   [MT]    Clinical Course User Index [MT] Mccall Will, Carola Rhine, MD    Final Clinical Impressions(s) / ED Diagnoses   Final diagnoses:  Drug rash  Chronic right-sided low back pain  without sciatica    ED Discharge Orders    None  Wyvonnia Dusky, MD 05/14/19 [phone]

## 2019-05-13 NOTE — ED Triage Notes (Signed)
Patient reports lower back pain x2 weeks. Denies flank and abdominal pain. Denies urinary sx. States self caths at baseline. Denies N/V/D, cough and SOB.

## 2019-05-13 NOTE — ED Notes (Signed)
PT NEEDS WIFE TO GO BACK WITH HIM. PT HAS DIFFICULTY WITH HIS HISTORY AND QUITE ANXIOUS WITHOUT WIFE PRESENT. PT IS REQUESTING WIFE BE PRESENT AS BED IS ASSIGNED

## 2019-05-13 NOTE — ED Notes (Signed)
Discharge instructions reviewed with pt and wife. Wife is requesting where to make a complaint regarding wait time. [facility] was upset with [facility], and not sending orders for the scans that needed to be done. Pt ambulatory on discharge.

## 2019-05-13 NOTE — Discharge Instructions (Addendum)
Your rash may be related to one of the medications you are taking.  It is very important you call your primary care physician and review all of your recent medications.  Your doctor may wish to stop some of these medications.  In the meantime you can take Benadryl as needed at night to help with the rash and itching.  You can also try calamine lotion for the itching.  Please follow-up with your doctor at the [facility] tomorrow.

## 2019-05-13 NOTE — Progress Notes (Signed)
Thoracentesis studies noted. Patient family notified of result. They informed me that Mr. Majka PCP is Dr. Domenica Fail at [facility]. Spoke with [facility] to obtain fax. Results have been faxed to Dr. Domenica Fail with recs for oncology follow up (05/13/19 @ 0950hrs).  Jonnie Finner, DO

## 2019-05-13 NOTE — ED Notes (Signed)
Entered room to obtain urine sample. Pt and wife refuse, stating they would like to leave and not wait on results. EDP made aware.

## 2019-05-14 LAB — CD19 AND CD20, FLOW CYTOMETRY

## 2019-05-26 ENCOUNTER — Other Ambulatory Visit: Payer: Self-pay | Admitting: Surgical

## 2019-05-26 ENCOUNTER — Other Ambulatory Visit (HOSPITAL_COMMUNITY): Payer: Self-pay | Admitting: Surgical

## 2019-05-26 DIAGNOSIS — N10 Acute tubulo-interstitial nephritis: Secondary | ICD-10-CM

## 2019-05-27 ENCOUNTER — Encounter (HOSPITAL_COMMUNITY): Payer: Self-pay | Admitting: Emergency Medicine

## 2019-05-27 ENCOUNTER — Inpatient Hospital Stay (HOSPITAL_COMMUNITY)
Admission: EM | Admit: 2019-05-27 | Discharge: 2019-06-04 | DRG: 853 | Disposition: A | Discharge status: Home Health | Payer: Medicare Other | Attending: Internal Medicine | Admitting: Internal Medicine

## 2019-05-27 ENCOUNTER — Inpatient Hospital Stay (HOSPITAL_COMMUNITY): Payer: Medicare Other

## 2019-05-27 ENCOUNTER — Emergency Department (HOSPITAL_COMMUNITY): Payer: Medicare Other

## 2019-05-27 ENCOUNTER — Other Ambulatory Visit: Payer: Self-pay

## 2019-05-27 DIAGNOSIS — N179 Acute kidney failure, unspecified: Secondary | ICD-10-CM | POA: Diagnosis not present

## 2019-05-27 DIAGNOSIS — I480 Paroxysmal atrial fibrillation: Secondary | ICD-10-CM | POA: Diagnosis present

## 2019-05-27 DIAGNOSIS — G9341 Metabolic encephalopathy: Secondary | ICD-10-CM | POA: Diagnosis not present

## 2019-05-27 DIAGNOSIS — Z6832 Body mass index (BMI) 32.0-32.9, adult: Secondary | ICD-10-CM

## 2019-05-27 DIAGNOSIS — I714 Abdominal aortic aneurysm, without rupture, unspecified: Secondary | ICD-10-CM | POA: Diagnosis present

## 2019-05-27 DIAGNOSIS — Z7984 Long term (current) use of oral hypoglycemic drugs: Secondary | ICD-10-CM

## 2019-05-27 DIAGNOSIS — J9811 Atelectasis: Secondary | ICD-10-CM | POA: Diagnosis not present

## 2019-05-27 DIAGNOSIS — Z7982 Long term (current) use of aspirin: Secondary | ICD-10-CM

## 2019-05-27 DIAGNOSIS — E1169 Type 2 diabetes mellitus with other specified complication: Secondary | ICD-10-CM

## 2019-05-27 DIAGNOSIS — E669 Obesity, unspecified: Secondary | ICD-10-CM | POA: Diagnosis present

## 2019-05-27 DIAGNOSIS — E875 Hyperkalemia: Secondary | ICD-10-CM | POA: Diagnosis not present

## 2019-05-27 DIAGNOSIS — R0602 Shortness of breath: Secondary | ICD-10-CM | POA: Diagnosis not present

## 2019-05-27 DIAGNOSIS — E1165 Type 2 diabetes mellitus with hyperglycemia: Secondary | ICD-10-CM | POA: Diagnosis present

## 2019-05-27 DIAGNOSIS — R062 Wheezing: Secondary | ICD-10-CM

## 2019-05-27 DIAGNOSIS — R0600 Dyspnea, unspecified: Secondary | ICD-10-CM | POA: Diagnosis not present

## 2019-05-27 DIAGNOSIS — J90 Pleural effusion, not elsewhere classified: Secondary | ICD-10-CM | POA: Diagnosis not present

## 2019-05-27 DIAGNOSIS — E222 Syndrome of inappropriate secretion of antidiuretic hormone: Secondary | ICD-10-CM | POA: Diagnosis not present

## 2019-05-27 DIAGNOSIS — N281 Cyst of kidney, acquired: Secondary | ICD-10-CM | POA: Diagnosis not present

## 2019-05-27 DIAGNOSIS — G9389 Other specified disorders of brain: Secondary | ICD-10-CM

## 2019-05-27 DIAGNOSIS — E785 Hyperlipidemia, unspecified: Secondary | ICD-10-CM | POA: Diagnosis present

## 2019-05-27 DIAGNOSIS — N2589 Other disorders resulting from impaired renal tubular function: Secondary | ICD-10-CM | POA: Diagnosis present

## 2019-05-27 DIAGNOSIS — F0390 Unspecified dementia without behavioral disturbance: Secondary | ICD-10-CM | POA: Diagnosis present

## 2019-05-27 DIAGNOSIS — J9601 Acute respiratory failure with hypoxia: Secondary | ICD-10-CM | POA: Diagnosis not present

## 2019-05-27 DIAGNOSIS — J159 Unspecified bacterial pneumonia: Secondary | ICD-10-CM | POA: Diagnosis not present

## 2019-05-27 DIAGNOSIS — I4891 Unspecified atrial fibrillation: Secondary | ICD-10-CM | POA: Diagnosis present

## 2019-05-27 DIAGNOSIS — R001 Bradycardia, unspecified: Secondary | ICD-10-CM | POA: Diagnosis present

## 2019-05-27 DIAGNOSIS — R6520 Severe sepsis without septic shock: Secondary | ICD-10-CM | POA: Diagnosis present

## 2019-05-27 DIAGNOSIS — A419 Sepsis, unspecified organism: Principal | ICD-10-CM | POA: Diagnosis present

## 2019-05-27 DIAGNOSIS — I4821 Permanent atrial fibrillation: Secondary | ICD-10-CM | POA: Diagnosis not present

## 2019-05-27 DIAGNOSIS — R918 Other nonspecific abnormal finding of lung field: Secondary | ICD-10-CM | POA: Diagnosis not present

## 2019-05-27 DIAGNOSIS — D72829 Elevated white blood cell count, unspecified: Secondary | ICD-10-CM | POA: Diagnosis not present

## 2019-05-27 DIAGNOSIS — Z20828 Contact with and (suspected) exposure to other viral communicable diseases: Secondary | ICD-10-CM | POA: Diagnosis present

## 2019-05-27 DIAGNOSIS — N400 Enlarged prostate without lower urinary tract symptoms: Secondary | ICD-10-CM | POA: Diagnosis present

## 2019-05-27 DIAGNOSIS — C3401 Malignant neoplasm of right main bronchus: Secondary | ICD-10-CM | POA: Diagnosis present

## 2019-05-27 DIAGNOSIS — C3490 Malignant neoplasm of unspecified part of unspecified bronchus or lung: Secondary | ICD-10-CM | POA: Diagnosis not present

## 2019-05-27 DIAGNOSIS — J189 Pneumonia, unspecified organism: Secondary | ICD-10-CM | POA: Diagnosis not present

## 2019-05-27 DIAGNOSIS — E872 Acidosis, unspecified: Secondary | ICD-10-CM | POA: Diagnosis present

## 2019-05-27 DIAGNOSIS — E871 Hypo-osmolality and hyponatremia: Secondary | ICD-10-CM | POA: Diagnosis present

## 2019-05-27 DIAGNOSIS — H9190 Unspecified hearing loss, unspecified ear: Secondary | ICD-10-CM | POA: Diagnosis present

## 2019-05-27 DIAGNOSIS — I1310 Hypertensive heart and chronic kidney disease without heart failure, with stage 1 through stage 4 chronic kidney disease, or unspecified chronic kidney disease: Secondary | ICD-10-CM | POA: Diagnosis present

## 2019-05-27 DIAGNOSIS — J910 Malignant pleural effusion: Secondary | ICD-10-CM | POA: Diagnosis present

## 2019-05-27 DIAGNOSIS — Z8249 Family history of ischemic heart disease and other diseases of the circulatory system: Secondary | ICD-10-CM

## 2019-05-27 DIAGNOSIS — E1122 Type 2 diabetes mellitus with diabetic chronic kidney disease: Secondary | ICD-10-CM | POA: Diagnosis present

## 2019-05-27 DIAGNOSIS — C7931 Secondary malignant neoplasm of brain: Secondary | ICD-10-CM | POA: Diagnosis present

## 2019-05-27 DIAGNOSIS — Z9689 Presence of other specified functional implants: Secondary | ICD-10-CM

## 2019-05-27 DIAGNOSIS — Z8546 Personal history of malignant neoplasm of prostate: Secondary | ICD-10-CM

## 2019-05-27 DIAGNOSIS — Z7901 Long term (current) use of anticoagulants: Secondary | ICD-10-CM

## 2019-05-27 DIAGNOSIS — I10 Essential (primary) hypertension: Secondary | ICD-10-CM | POA: Diagnosis not present

## 2019-05-27 DIAGNOSIS — N1832 Chronic kidney disease, stage 3b: Secondary | ICD-10-CM | POA: Diagnosis present

## 2019-05-27 DIAGNOSIS — G936 Cerebral edema: Secondary | ICD-10-CM | POA: Diagnosis not present

## 2019-05-27 DIAGNOSIS — Z9889 Other specified postprocedural states: Secondary | ICD-10-CM

## 2019-05-27 DIAGNOSIS — C3411 Malignant neoplasm of upper lobe, right bronchus or lung: Secondary | ICD-10-CM

## 2019-05-27 DIAGNOSIS — Z87891 Personal history of nicotine dependence: Secondary | ICD-10-CM

## 2019-05-27 DIAGNOSIS — T380X5A Adverse effect of glucocorticoids and synthetic analogues, initial encounter: Secondary | ICD-10-CM | POA: Diagnosis not present

## 2019-05-27 DIAGNOSIS — Z79899 Other long term (current) drug therapy: Secondary | ICD-10-CM

## 2019-05-27 DIAGNOSIS — C801 Malignant (primary) neoplasm, unspecified: Secondary | ICD-10-CM | POA: Diagnosis not present

## 2019-05-27 DIAGNOSIS — Z4682 Encounter for fitting and adjustment of non-vascular catheter: Secondary | ICD-10-CM | POA: Diagnosis not present

## 2019-05-27 HISTORY — DX: Unspecified hearing loss, unspecified ear: H91.90

## 2019-05-27 HISTORY — DX: Unspecified atrial fibrillation: I48.91

## 2019-05-27 HISTORY — DX: Unspecified dementia, unspecified severity, without behavioral disturbance, psychotic disturbance, mood disturbance, and anxiety: F03.90

## 2019-05-27 HISTORY — PX: IR THORACENTESIS ASP PLEURAL SPACE W/IMG GUIDE: IMG5380

## 2019-05-27 LAB — COMPREHENSIVE METABOLIC PANEL
ALT: 16 U/L (ref 0–44)
AST: 15 U/L (ref 15–41)
Albumin: 2.5 g/dL — ABNORMAL LOW (ref 3.5–5.0)
Alkaline Phosphatase: 99 U/L (ref 38–126)
Anion gap: 15 (ref 5–15)
BUN: 50 mg/dL — ABNORMAL HIGH (ref 8–23)
CO2: 17 mmol/L — ABNORMAL LOW (ref 22–32)
Calcium: 8.4 mg/dL — ABNORMAL LOW (ref 8.9–10.3)
Chloride: 94 mmol/L — ABNORMAL LOW (ref 98–111)
Creatinine, Ser: 2.33 mg/dL — ABNORMAL HIGH (ref 0.61–1.24)
GFR calc Af Amer: 30 mL/min — ABNORMAL LOW (ref >60)
GFR calc non Af Amer: 26 mL/min — ABNORMAL LOW (ref >60)
Glucose, Bld: 196 mg/dL — ABNORMAL HIGH (ref 70–99)
Potassium: 6.1 mmol/L — ABNORMAL HIGH (ref 3.5–5.1)
Sodium: 126 mmol/L — ABNORMAL LOW (ref 135–145)
Total Bilirubin: 0.7 mg/dL (ref 0.3–1.2)
Total Protein: 7.1 g/dL (ref 6.5–8.1)

## 2019-05-27 LAB — CBC WITH DIFFERENTIAL/PLATELET
Abs Immature Granulocytes: 0 10*3/uL (ref 0.00–0.07)
Basophils Absolute: 0 10*3/uL (ref 0.0–0.1)
Basophils Relative: 0 %
Eosinophils Absolute: 4.1 10*3/uL — ABNORMAL HIGH (ref 0.0–0.5)
Eosinophils Relative: 12 %
HCT: 40.3 % (ref 39.0–52.0)
Hemoglobin: 13.2 g/dL (ref 13.0–17.0)
Lymphocytes Relative: 5 %
Lymphs Abs: 1.7 10*3/uL (ref 0.7–4.0)
MCH: 29.2 pg (ref 26.0–34.0)
MCHC: 32.8 g/dL (ref 30.0–36.0)
MCV: 89.2 fL (ref 80.0–100.0)
Monocytes Absolute: 3.8 10*3/uL — ABNORMAL HIGH (ref 0.1–1.0)
Monocytes Relative: 11 %
Neutro Abs: 24.6 10*3/uL — ABNORMAL HIGH (ref 1.7–7.7)
Neutrophils Relative %: 72 %
Platelets: 258 10*3/uL (ref 150–400)
RBC: 4.52 MIL/uL (ref 4.22–5.81)
RDW: 14.2 % (ref 11.5–15.5)
WBC: 34.2 10*3/uL — ABNORMAL HIGH (ref 4.0–10.5)
nRBC: 0 % (ref 0.0–0.2)
nRBC: 0 /100 WBC

## 2019-05-27 LAB — URINALYSIS, ROUTINE W REFLEX MICROSCOPIC
Bilirubin Urine: NEGATIVE
Glucose, UA: 50 mg/dL — AB
Hgb urine dipstick: NEGATIVE
Ketones, ur: NEGATIVE mg/dL
Nitrite: NEGATIVE
Protein, ur: 30 mg/dL — AB
Specific Gravity, Urine: 1.016 (ref 1.005–1.030)
pH: 5 (ref 5.0–8.0)

## 2019-05-27 LAB — GLUCOSE, CAPILLARY: Glucose-Capillary: 163 mg/dL — ABNORMAL HIGH (ref 70–99)

## 2019-05-27 LAB — BODY FLUID CELL COUNT WITH DIFFERENTIAL
Eos, Fluid: 12 %
Lymphs, Fluid: 65 %
Monocyte-Macrophage-Serous Fluid: 2 % — ABNORMAL LOW (ref 50–90)
Neutrophil Count, Fluid: 21 % (ref 0–25)
Total Nucleated Cell Count, Fluid: 730 cu mm (ref 0–1000)

## 2019-05-27 LAB — GLUCOSE, PLEURAL OR PERITONEAL FLUID: Glucose, Fluid: 163 mg/dL

## 2019-05-27 LAB — TROPONIN I (HIGH SENSITIVITY)
Troponin I (High Sensitivity): 8 ng/L (ref <18)
Troponin I (High Sensitivity): 7 ng/L (ref <18)

## 2019-05-27 LAB — LIPASE, BLOOD: Lipase: 30 U/L (ref 11–51)

## 2019-05-27 LAB — SARS CORONAVIRUS 2 (TAT 6-24 HRS): SARS Coronavirus 2: NEGATIVE

## 2019-05-27 LAB — LACTIC ACID, PLASMA
Lactic Acid, Venous: 1.8 mmol/L (ref 0.5–1.9)
Lactic Acid, Venous: 1.5 mmol/L (ref 0.5–1.9)

## 2019-05-27 MED ORDER — LIDOCAINE HCL 1 % IJ SOLN
INTRAMUSCULAR | Status: AC
  Filled 2019-05-27: qty 20

## 2019-05-27 MED ORDER — VANCOMYCIN HCL 10 G IV SOLR
2000.0000 mg | Freq: Once | INTRAVENOUS | Status: DC
  Filled 2019-05-27: qty 2000

## 2019-05-27 MED ORDER — ASPIRIN EC 81 MG PO TBEC
81.0000 mg | DELAYED_RELEASE_TABLET | Freq: Every day | ORAL | Status: DC
  Administered 2019-05-28 – 2019-06-04 (×8): 81 mg via ORAL
  Filled 2019-05-27 (×8): qty 1

## 2019-05-27 MED ORDER — INSULIN ASPART 100 UNIT/ML ~~LOC~~ SOLN
0.0000 [IU] | Freq: Every day | SUBCUTANEOUS | Status: DC
  Administered 2019-05-30: 4 [IU] via SUBCUTANEOUS

## 2019-05-27 MED ORDER — SODIUM CHLORIDE 0.9 % IV SOLN: 1.0000 g | Freq: Once | INTRAVENOUS | Status: DC

## 2019-05-27 MED ORDER — HYDROCODONE-ACETAMINOPHEN 5-325 MG PO TABS
1.0000 | ORAL_TABLET | Freq: Four times a day (QID) | ORAL | Status: DC | PRN
  Administered 2019-05-29 – 2019-05-31 (×3): 1 via ORAL
  Filled 2019-05-27 (×3): qty 1

## 2019-05-27 MED ORDER — SODIUM CHLORIDE 0.9 % IV SOLN
2.0000 g | Freq: Once | INTRAVENOUS | Status: AC
  Administered 2019-05-27: 13:00:00 2 g via INTRAVENOUS
  Filled 2019-05-27: qty 2

## 2019-05-27 MED ORDER — TAMSULOSIN HCL 0.4 MG PO CAPS
0.4000 mg | ORAL_CAPSULE | Freq: Every day | ORAL | Status: DC
  Administered 2019-05-27 – 2019-06-04 (×9): 0.4 mg via ORAL
  Filled 2019-05-27 (×9): qty 1

## 2019-05-27 MED ORDER — MEMANTINE HCL 10 MG PO TABS
10.0000 mg | ORAL_TABLET | Freq: Every day | ORAL | Status: DC
  Administered 2019-05-27 – 2019-05-28 (×2): 10 mg via ORAL
  Filled 2019-05-27 (×2): qty 1

## 2019-05-27 MED ORDER — SODIUM CHLORIDE 0.9 % IV SOLN
2.0000 g | Freq: Two times a day (BID) | INTRAVENOUS | Status: DC
  Administered 2019-05-28 – 2019-06-03 (×14): 2 g via INTRAVENOUS
  Filled 2019-05-27 (×16): qty 2

## 2019-05-27 MED ORDER — SODIUM BICARBONATE 650 MG PO TABS
650.0000 mg | ORAL_TABLET | Freq: Three times a day (TID) | ORAL | Status: DC
  Administered 2019-05-27 – 2019-05-31 (×11): 650 mg via ORAL
  Filled 2019-05-27 (×13): qty 1

## 2019-05-27 MED ORDER — SODIUM CHLORIDE 0.9 % IV BOLUS
500.0000 mL | Freq: Once | INTRAVENOUS | Status: AC
  Administered 2019-05-27: 500 mL via INTRAVENOUS

## 2019-05-27 MED ORDER — STERILE WATER FOR INJECTION IV SOLN
INTRAVENOUS | Status: DC
  Filled 2019-05-27: qty 9.71

## 2019-05-27 MED ORDER — GUAIFENESIN ER 600 MG PO TB12
600.0000 mg | ORAL_TABLET | Freq: Two times a day (BID) | ORAL | Status: DC
  Administered 2019-05-27 – 2019-06-04 (×17): 600 mg via ORAL
  Filled 2019-05-27 (×16): qty 1

## 2019-05-27 MED ORDER — PANTOPRAZOLE SODIUM 40 MG PO TBEC
40.0000 mg | DELAYED_RELEASE_TABLET | Freq: Every day | ORAL | Status: DC
  Administered 2019-05-27 – 2019-06-04 (×9): 40 mg via ORAL
  Filled 2019-05-27 (×9): qty 1

## 2019-05-27 MED ORDER — SODIUM CHLORIDE 0.9 % IV BOLUS
1000.0000 mL | Freq: Once | INTRAVENOUS | Status: AC
  Administered 2019-05-27: 1000 mL via INTRAVENOUS

## 2019-05-27 MED ORDER — INSULIN ASPART 100 UNIT/ML ~~LOC~~ SOLN
0.0000 [IU] | Freq: Three times a day (TID) | SUBCUTANEOUS | Status: DC
  Administered 2019-05-28: 1 [IU] via SUBCUTANEOUS
  Administered 2019-05-29: 2 [IU] via SUBCUTANEOUS
  Administered 2019-05-29 (×2): 1 [IU] via SUBCUTANEOUS
  Administered 2019-05-30: 2 [IU] via SUBCUTANEOUS
  Administered 2019-05-30: 5 [IU] via SUBCUTANEOUS
  Administered 2019-05-30: 7 [IU] via SUBCUTANEOUS
  Administered 2019-05-31: 5 [IU] via SUBCUTANEOUS

## 2019-05-27 MED ORDER — ALBUTEROL SULFATE (2.5 MG/3ML) 0.083% IN NEBU
2.5000 mg | INHALATION_SOLUTION | RESPIRATORY_TRACT | Status: DC | PRN
  Administered 2019-05-28 – 2019-05-31 (×5): 2.5 mg via RESPIRATORY_TRACT
  Filled 2019-05-27 (×5): qty 3

## 2019-05-27 MED ORDER — ATORVASTATIN CALCIUM 80 MG PO TABS
80.0000 mg | ORAL_TABLET | Freq: Every day | ORAL | Status: DC
  Administered 2019-05-28 – 2019-06-02 (×5): 80 mg via ORAL
  Filled 2019-05-27 (×6): qty 1

## 2019-05-27 MED ORDER — ACETAMINOPHEN 500 MG PO TABS
1000.0000 mg | ORAL_TABLET | Freq: Four times a day (QID) | ORAL | Status: DC | PRN
  Administered 2019-05-28 (×2): 1000 mg via ORAL
  Filled 2019-05-27 (×2): qty 2

## 2019-05-27 MED ORDER — SODIUM CHLORIDE 0.9 % IV SOLN
INTRAVENOUS | Status: DC
  Administered 2019-05-27 – 2019-05-29 (×4): via INTRAVENOUS

## 2019-05-27 MED ORDER — APIXABAN 5 MG PO TABS
5.0000 mg | ORAL_TABLET | Freq: Two times a day (BID) | ORAL | Status: DC
  Administered 2019-05-27 – 2019-05-28 (×2): 5 mg via ORAL
  Filled 2019-05-27 (×2): qty 1

## 2019-05-27 MED ORDER — CYCLOBENZAPRINE HCL 10 MG PO TABS: 10.0000 mg | ORAL_TABLET | Freq: Three times a day (TID) | ORAL | Status: DC | PRN

## 2019-05-27 MED ORDER — SODIUM CHLORIDE 0.9 % IV SOLN: 500.0000 mg | Freq: Once | INTRAVENOUS | Status: DC

## 2019-05-27 MED ORDER — DILTIAZEM HCL ER COATED BEADS 120 MG PO CP24
120.0000 mg | ORAL_CAPSULE | Freq: Every day | ORAL | Status: DC
  Administered 2019-05-27 – 2019-05-28 (×2): 120 mg via ORAL
  Filled 2019-05-27 (×2): qty 1

## 2019-05-27 NOTE — ED Notes (Signed)
Carb modified dinner tray ordered 

## 2019-05-27 NOTE — Procedures (Signed)
PROCEDURE SUMMARY:  Successful image-guided right thoracentesis. Yielded 1.3 liters of bloody fluid. Patient tolerated procedure well. EBL: Zero No immediate complications.  Specimen was sent for labs. Post procedure CXR pending.  Please see imaging section of Epic for full dictation.  Joaquim Nam PA-C 05/27/2019 4:57 PM

## 2019-05-27 NOTE — H&P (Signed)
History and Physical    Brian French. JME:[id number] DOB: [birth year] DOA: 05/27/2019  PCP: [facility]  Patient coming from: Home.  I have personally briefly reviewed patient's old medical records available.   Chief Complaint: Shortness of breath  HPI: Brian French. is a 78 y.o. male with medical history significant of type 2 diabetes on oral hypoglycemics, hyperlipidemia, hypertension, A. fib on Eliquis, recently diagnosed malignant pleural effusion and hospitalization presents to the emergency department with worsening shortness of breath and abdominal discomfort.  Patient is poor historian.  History reviewed and discussed with patient's wife.  Patient was in the hospital about 3 weeks ago for right-sided pneumonia, parapneumonic effusion, cytology with positive malignant cells scheduled for PET scan next week.  Apparently he was doing well and went home on room air.  According to the family he continues to have dyspnea on exertion, worse since last 1 week and he is short of breath even at rest now.  He does have mild cough but no sputum production.  No fever.  No extremity swelling.  Has some abdominal pain, diffuse, constant, mild and no relation to oral intake or bowel movements. ED Course: Initial blood pressure 89/59 responded to 1.5 L of saline.  Chest x-ray shows large right lower lobe mass/consolidation with effusion.  He is on 3 L oxygen.  WBC count is 34,000 with left shift.  BUN/creatinine 50/2.33, baseline creatinine about 1.26.  Sodium is 126 and potassium is 6.1.  EKG shows A. fib with ventricular rate of 117, no acute T wave changes. Patient was given IV fluid, sepsis alert was called and started on vancomycin and cefepime.  Review of Systems: all systems are reviewed and pertinent positive as per HPI otherwise rest are negative.    Past Medical History:  Diagnosis Date  . Diabetes mellitus without complication ([place])   . Headache disorder 05/22/2015  .  Hyperlipidemia   . Hypertension     Past Surgical History:  Procedure Laterality Date  . IR THORACENTESIS ASP PLEURAL SPACE W/IMG GUIDE  04/30/2019  . right testicular swelling     had I and D after swelling s/p trauma from scrotal  swelling, got hit in the testes during judo 1962     reports that he quit smoking about 21 years ago. His smoking use included cigarettes. He smoked 2.00 packs per day. He has never used smokeless tobacco. He reports current alcohol use. He reports that he does not use drugs.  No Known Allergies  Family History  Problem Relation Age of Onset  . Cancer Mother   . Heart disease Father      Prior to Admission medications   Medication Sig Start Date End Date Taking? Authorizing Provider  acetaminophen (TYLENOL) 500 MG tablet Take 1,000 mg by mouth every 6 (six) hours as needed for mild pain or headache.    [provider]  apixaban (ELIQUIS) 5 MG TABS tablet Take 1 tablet (5 mg total) by mouth 2 (two) times daily. 05/03/19 06/02/19  Cherylann Ratel A, DO  aspirin EC 81 MG tablet Take 81 mg by mouth daily.    [provider]  atorvastatin (LIPITOR) 80 MG tablet Take 80 mg by mouth daily at 6 PM.    [provider]  cyclobenzaprine (FLEXERIL) 10 MG tablet Take 1 tablet (10 mg total) by mouth 3 (three) times daily as needed for muscle spasms. 04/25/19   Gareth Morgan, MD  diltiazem (CARDIZEM CD) 120 MG 24 hr capsule  Take 1 capsule (120 mg total) by mouth daily. 05/03/19 05/02/20  Cherylann Ratel A, DO  glipiZIDE (GLUCOTROL) 10 MG tablet Take 10 mg by mouth 2 (two) times daily before a meal.    [provider]  HYDROcodone-acetaminophen (NORCO/VICODIN) 5-325 MG tablet Take 1-2 tablets every 6 hours as needed for severe pain 05/08/19   Carlisle Cater, PA-C  lidocaine (LIDODERM) 5 % Place 1 patch onto the skin daily. Remove & Discard patch within 12 hours or as directed by MD 04/25/19   Gareth Morgan, MD  memantine (NAMENDA) 10  MG tablet Take 10 mg by mouth at bedtime.    [provider]  metFORMIN (GLUCOPHAGE) 500 MG tablet Take 500 mg by mouth 2 (two) times daily with a meal.     [provider]  Multiple Vitamin (MULTIVITAMIN WITH MINERALS) TABS tablet Take 1 tablet by mouth daily.    [provider]  Omega-3 Fatty Acids (FISH OIL) 1000 MG CAPS Take 2,000 mg by mouth 2 (two) times daily.    [provider]  omeprazole (PRILOSEC) 20 MG capsule Take 20 mg by mouth daily.    [provider]  pioglitazone (ACTOS) 45 MG tablet Take 45 mg by mouth daily.    [provider]  tamsulosin (FLOMAX) 0.4 MG CAPS capsule Take 0.4 mg by mouth daily.    [provider]    Physical Exam: Vitals:   05/27/19 1105 05/27/19 1202 05/27/19 1227  BP: (!) 89/59  121/75  Pulse: 99  75  Resp: 19  20  Temp: 97.6 F (36.4 C)  97.7 F (36.5 C)  TempSrc: Oral  Oral  SpO2: 94%  93%  Weight:  104.3 kg   Height:  5\' 11"  (1.803 m)     Constitutional: NAD, calm, comfortable Vitals:   05/27/19 1105 05/27/19 1202 05/27/19 1227  BP: (!) 89/59  121/75  Pulse: 99  75  Resp: 19  20  Temp: 97.6 F (36.4 C)  97.7 F (36.5 C)  TempSrc: Oral  Oral  SpO2: 94%  93%  Weight:  104.3 kg   Height:  5\' 11"  (1.803 m)    Eyes: PERRL, lids and conjunctivae normal ENMT: Mucous membranes are moist. Posterior pharynx clear of any exudate or lesions.Normal dentition.  Neck: normal, supple, no masses, no thyromegaly  Respiratory: No air entry on right base.  No other added sounds.  In mild respiratory distress.  On 3 L oxygen.   Cardiovascular: Irregularly irregular.  no murmurs / rubs / gallops. No extremity edema. 2+ pedal pulses. No carotid bruits.  Abdomen: no tenderness, no masses palpated. No hepatosplenomegaly. Bowel sounds positive.  Obese and pendulous. Musculoskeletal: no clubbing / cyanosis. No joint deformity upper and lower extremities. Good ROM, no contractures. Normal muscle  tone.  Skin: no rashes, lesions, ulcers. No induration Neurologic: CN 2-12 grossly intact. Sensation intact, DTR normal. Strength 5/5 in all 4.  Psychiatric: Normal judgment and insight. Alert and oriented x 3. Normal mood.     Labs on Admission: I have personally reviewed following labs and imaging studies  CBC: Recent Labs  Lab 05/27/19 1200  WBC 34.2*  NEUTROABS 24.6*  HGB 13.2  HCT 40.3  MCV 89.2  PLT 831   Basic Metabolic Panel: Recent Labs  Lab 05/27/19 1200  NA 126*  K 6.1*  CL 94*  CO2 17*  GLUCOSE 196*  BUN 50*  CREATININE 2.33*  CALCIUM 8.4*   GFR: Estimated Creatinine Clearance: 32.6 mL/min (A) (  by C-G formula based on SCr of 2.33 mg/dL (H)). Liver Function Tests: Recent Labs  Lab 05/27/19 1200  AST 15  ALT 16  ALKPHOS 99  BILITOT 0.7  PROT 7.1  ALBUMIN 2.5*   Recent Labs  Lab 05/27/19 1200  LIPASE 30   No results for input(s): AMMONIA in the last 168 hours. Coagulation Profile: No results for input(s): INR, PROTIME in the last 168 hours. Cardiac Enzymes: No results for input(s): CKTOTAL, CKMB, CKMBINDEX, TROPONINI in the last 168 hours. BNP (last 3 results) No results for input(s): PROBNP in the last 8760 hours. HbA1C: No results for input(s): HGBA1C in the last 72 hours. CBG: No results for input(s): GLUCAP in the last 168 hours. Lipid Profile: No results for input(s): CHOL, HDL, LDLCALC, TRIG, CHOLHDL, LDLDIRECT in the last 72 hours. Thyroid Function Tests: No results for input(s): TSH, T4TOTAL, FREET4, T3FREE, THYROIDAB in the last 72 hours. Anemia Panel: No results for input(s): VITAMINB12, FOLATE, FERRITIN, TIBC, IRON, RETICCTPCT in the last 72 hours. Urine analysis:    Component Value Date/Time   COLORURINE YELLOW 05/27/2019 1223   APPEARANCEUR HAZY (A) 05/27/2019 1223   LABSPEC 1.016 05/27/2019 1223   PHURINE 5.0 05/27/2019 1223   GLUCOSEU 50 (A) 05/27/2019 1223   HGBUR NEGATIVE 05/27/2019 1223   BILIRUBINUR NEGATIVE  05/27/2019 1223   KETONESUR NEGATIVE 05/27/2019 1223   PROTEINUR 30 (A) 05/27/2019 1223   NITRITE NEGATIVE 05/27/2019 1223   LEUKOCYTESUR SMALL (A) 05/27/2019 1223    Radiological Exams on Admission: Dg Chest Portable 1 View  Result Date: 05/27/2019 CLINICAL DATA:  Patient brought in by wife stating patient c/o shortness of breath, decreased appetite, dizziness and pain all over. EXAM: PORTABLE CHEST 1 VIEW COMPARISON:  04/30/2019 and earlier exams. FINDINGS: There is significant increased opacity on the right when compared to the most recent prior chest radiograph. There is at least a moderate pleural effusion. Additional opacity right perihilar and lower lung consistent with atelectasis, pneumonia or a combination. This has also increased from the prior exam. Left lung remains clear.  No left pleural effusion. Cardiac silhouette is normal in size. No mediastinal or left hilar mass. Right hilum partly obscured. Skeletal structures are grossly intact. IMPRESSION: 1. Worsening aeration on the right compared to the most recent prior study. Previously noted right pleural effusion has enlarged. There is increased opacity in the right perihilar and right lung base is consistent with pneumonia, atelectasis or a combination, with atelectasis favored. Electronically Signed   By: Lajean Manes M.D.   On: 05/27/2019 12:07    EKG: Independently reviewed.  A. fib with RVR.  No other acute changes.  Assessment/Plan Principal Problem:   Pneumonia due to infectious agent Active Problems:   Atrial fibrillation with RVR ([facility])   DM2 (diabetes mellitus, type 2) ([facility])   Malignant pleural effusion   AKI (acute kidney injury) ([facility])   Hyperkalemia     1.  Pneumonia due to infectious agent, hypoxia due to bacterial pneumonia suspected with recent hospitalization: Suspect postobstructive pneumonia with underlying lung tumor. Agree with admission given severity of symptoms. Antibiotics to treat bacterial  pneumonia.  With recent hospitalization will continue vancomycin and cefepime. Chest physiotherapy, incentive spirometry, deep breathing exercises, sputum induction, mucolytic's and bronchodilators. Sputum cultures, blood cultures, Supplemental oxygen to keep saturations more than 90%. Given neutrophilic leukocytosis with ANC of 24,000, suspecting bacterial infection, however he may just have malignant pleural effusion causing hypoxia. Due to significant symptoms, he will need thoracentesis.  Will  order Gram stain and cultures.  2.  Acute kidney injury with hyperkalemia: No EKG changes.  Admit to monitored unit.  Patient probably on ACE inhibitor's at home. Treated with 1.5 L of normal saline.  Sodium level is low.  Will start on oral bicarb, keep on normal saline. Recheck levels in the morning to ensure stabilization.  3.  A. fib with RVR: With permanent A. fib.  Asymptomatic.  Resume diltiazem and Eliquis.  4.  Type 2 diabetes on oral hypoglycemics: Hold oral hypoglycemics.  Start sliding scale insulin.  5.  Newly diagnosed malignant pleural effusion: Scheduled for PET scan outpatient.  He will keep this up.  Wife updated.  Will stabilize before discharge.  DVT prophylaxis: Eliquis Code Status: Full code Family Communication: Wife on the phone Disposition Plan: Home after hospitalization Consults called: None Admission status: Inpatient.  Telemetry   Barb Merino MD [facility] Pager [phone]

## 2019-05-27 NOTE — Progress Notes (Signed)
Pharmacy Antibiotic Note  Brian French. is a 78 y.o. male admitted on 05/27/2019 with pneumonia.  Pharmacy has been consulted for vancomycin and cefepime dosing. Pt is afebrile and WBC is elevated at 34.2. SCr is well above baseline.   Plan: Vancomycin 2gm IV x 1 then trend Scr for further doses Cefepime 2gm IV Q12H F/u renal fxn, C&S, clinical status and peak/trough at SS  Height: 5\' 11"  (180.3 cm) Weight: 230 lb (104.3 kg) IBW/kg (Calculated) : 75.3  Temp (24hrs), Avg:97.7 F (36.5 C), Min:97.6 F (36.4 C), Max:97.7 F (36.5 C)  Recent Labs  Lab 05/27/19 1200  WBC 34.2*    Estimated Creatinine Clearance: 41.1 mL/min (A) (by C-G formula based on SCr of 1.85 mg/dL (H)).    No Known Allergies  Antimicrobials this admission: Vanc 11/12>> Cefepime 11/12>>  Dose adjustments this admission: N/A  Microbiology results: Pending  Thank you for allowing pharmacy to be a part of this patient's care.  Jahniya Duzan, Rande Lawman 05/27/2019 12:59 PM

## 2019-05-27 NOTE — ED Notes (Addendum)
Pt's brother Philippa Chester wants an update on the patient when possible. [phone]

## 2019-05-27 NOTE — ED Provider Notes (Addendum)
[facility] EMERGENCY DEPARTMENT Provider Note   CSN: [id number] Arrival date & time: 05/27/19  1052     History   Chief Complaint Chief Complaint  Patient presents with   Shortness of Breath    HPI Brian French. is a 78 y.o. male.     HPI   Patient is a 78 year old male with a history of diabetes, hyperlipidemia, hypertension, atrial fibrillation (On Eliquis), who presents to the emergency department today complaining of shortness of breath and abdominal pain. Patient states that he was admitted to the hospital about 3 weeks ago with pneumonia. Since then he has had dyspnea on exertion but over the last week it seems to be worse and now he is short of breath at rest. He does have some pain with inspiration. He does have a mild cough. Denies any lower extremity swelling or productive cough. Denies any fevers. Denies any missed doses of Eliquis.  Additionally, patient complains of abdominal pain for the last 2 weeks. Complaining of pain to the lower abdomen that is constant. He reports nausea but denies any vomiting or diarrhea. He is not sure if he has been constipated but he does not know when his last bowel movement was. Patient self caths.  His wife at bedside states that he gets some of his care at the [place]. He is currently seeing an oncologist due to concern for possible cancer but she is unsure if he does have cancer and potential primary is unknown. She denies any recent colonoscopies. He does have history of smoking but quit 20 years ago.  Reviewed records in epic. Recent admission 04/30/19. Has parapneumonic effusion that required thoracentesis by IR. No growth on gram stain and cultures.   Past Medical History:  Diagnosis Date   Diabetes mellitus without complication ([place])    Headache disorder 05/22/2015   Hyperlipidemia    Hypertension     Patient Active Problem List   Diagnosis Date Noted   Pneumonia due to infectious agent 05/27/2019     Atrial fibrillation with RVR ([place]) 04/30/2019   Acute pyelonephritis 04/30/2019   Parapneumonic effusion 04/30/2019   DM2 (diabetes mellitus, type 2) ([place]) 04/30/2019   Community acquired pneumonia of right upper lobe of lung 04/29/2019   Vertigo 05/22/2015   Headache disorder 05/22/2015    Past Surgical History:  Procedure Laterality Date   IR THORACENTESIS ASP PLEURAL SPACE W/IMG GUIDE  04/30/2019   right testicular swelling     had I and D after swelling s/p trauma from scrotal  swelling, got hit in the testes during judo [place] Medications    Prior to Admission medications   Medication Sig Start Date End Date Taking? Authorizing Provider  acetaminophen (TYLENOL) 500 MG tablet Take 1,000 mg by mouth every 6 (six) hours as needed for mild pain or headache.    [provider]  apixaban (ELIQUIS) 5 MG TABS tablet Take 1 tablet (5 mg total) by mouth 2 (two) times daily. 05/03/19 06/02/19  Cherylann Ratel A, DO  aspirin EC 81 MG tablet Take 81 mg by mouth daily.    [provider]  atorvastatin (LIPITOR) 80 MG tablet Take 80 mg by mouth daily at 6 PM.    [provider]  cyclobenzaprine (FLEXERIL) 10 MG tablet Take 1 tablet (10 mg total) by mouth 3 (three) times daily as needed for muscle spasms. 04/25/19   Gareth Morgan, MD  diltiazem (CARDIZEM CD) 120 MG  24 hr capsule Take 1 capsule (120 mg total) by mouth daily. 05/03/19 05/02/20  Cherylann Ratel A, DO  glipiZIDE (GLUCOTROL) 10 MG tablet Take 10 mg by mouth 2 (two) times daily before a meal.    [provider]  HYDROcodone-acetaminophen (NORCO/VICODIN) 5-325 MG tablet Take 1-2 tablets every 6 hours as needed for severe pain 05/08/19   Carlisle Cater, PA-C  lidocaine (LIDODERM) 5 % Place 1 patch onto the skin daily. Remove & Discard patch within 12 hours or as directed by MD 04/25/19   Gareth Morgan, MD  memantine (NAMENDA) 10 MG tablet Take 10 mg by mouth at bedtime.     [provider]  metFORMIN (GLUCOPHAGE) 500 MG tablet Take 500 mg by mouth 2 (two) times daily with a meal.     [provider]  Multiple Vitamin (MULTIVITAMIN WITH MINERALS) TABS tablet Take 1 tablet by mouth daily.    [provider]  Omega-3 Fatty Acids (FISH OIL) 1000 MG CAPS Take 2,000 mg by mouth 2 (two) times daily.    [provider]  omeprazole (PRILOSEC) 20 MG capsule Take 20 mg by mouth daily.    [provider]  pioglitazone (ACTOS) 45 MG tablet Take 45 mg by mouth daily.    [provider]  tamsulosin (FLOMAX) 0.4 MG CAPS capsule Take 0.4 mg by mouth daily.    [provider]    Family History Family History  Problem Relation Age of Onset   Cancer Mother    Heart disease Father     Social History Social History   Tobacco Use   Smoking status: Former Smoker    Packs/day: 2.00    Types: Cigarettes    Quit date: 07/15/1997    Years since quitting: 21.8   Smokeless tobacco: Never Used  Substance Use Topics   Alcohol use: Yes    Alcohol/week: 0.0 standard drinks    Comment: occasionally   Drug use: Never     Allergies   Patient has no known allergies.   Review of Systems Review of Systems  Constitutional: Negative for fever.  HENT: Negative for ear pain and sore throat.   Eyes: Negative for visual disturbance.  Respiratory: Positive for cough and shortness of breath.   Cardiovascular: Positive for chest pain (with inspiration). Negative for leg swelling.  Gastrointestinal: Positive for abdominal pain and nausea. Negative for diarrhea and vomiting.  Genitourinary: Negative for dysuria and hematuria.  Musculoskeletal: Positive for back pain (chronic).  Skin: Negative for rash.  Neurological: Negative for headaches.  All other systems reviewed and are negative.  Physical Exam Updated Vital Signs BP 121/75 (BP Location: Left Arm)    Pulse 75    Temp 97.7 F (36.5 C) (Oral)    Resp 20    Ht 5'  11" (1.803 m)    Wt 104.3 kg    SpO2 93%    BMI 32.08 kg/m   Physical Exam Vitals signs and nursing note reviewed.  Constitutional:      Appearance: He is well-developed.  HENT:     Head: Normocephalic and atraumatic.  Eyes:     Conjunctiva/sclera: Conjunctivae normal.  Neck:     Musculoskeletal: Neck supple.  Cardiovascular:     Rate and Rhythm: Normal rate and regular rhythm.     Heart sounds: No murmur.  Pulmonary:     Effort: Pulmonary effort is normal.     Breath sounds: Examination of the right-upper field reveals decreased breath sounds. Examination of  the right-middle field reveals decreased breath sounds. Examination of the right-lower field reveals decreased breath sounds. Examination of the left-lower field reveals rales. Decreased breath sounds and rales present.     Comments: Tachypneic, speaking in short sentences Abdominal:     Palpations: Abdomen is soft.     Tenderness: There is no abdominal tenderness.  Skin:    General: Skin is warm and dry.  Neurological:     Mental Status: He is alert.      ED Treatments / Results  Labs (all labs ordered are listed, but only abnormal results are displayed) Labs Reviewed  CBC WITH DIFFERENTIAL/PLATELET - Abnormal; Notable for the following components:      Result Value   WBC 34.2 (*)    Neutro Abs 24.6 (*)    Monocytes Absolute 3.8 (*)    Eosinophils Absolute 4.1 (*)    All other components within normal limits  COMPREHENSIVE METABOLIC PANEL - Abnormal; Notable for the following components:   Sodium 126 (*)    Potassium 6.1 (*)    Chloride 94 (*)    CO2 17 (*)    Glucose, Bld 196 (*)    BUN 50 (*)    Creatinine, Ser 2.33 (*)    Calcium 8.4 (*)    Albumin 2.5 (*)    GFR calc non Af Amer 26 (*)    GFR calc Af Amer 30 (*)    All other components within normal limits  URINALYSIS, ROUTINE W REFLEX MICROSCOPIC - Abnormal; Notable for the following components:   APPearance HAZY (*)    Glucose, UA 50 (*)     Protein, ur 30 (*)    Leukocytes,Ua SMALL (*)    Bacteria, UA RARE (*)    All other components within normal limits  SARS CORONAVIRUS 2 (TAT 6-24 HRS)  URINE CULTURE  CULTURE, BLOOD (ROUTINE X 2)  CULTURE, BLOOD (ROUTINE X 2)  LIPASE, BLOOD  LACTIC ACID, PLASMA  LACTIC ACID, PLASMA  TROPONIN I (HIGH SENSITIVITY)  TROPONIN I (HIGH SENSITIVITY)    EKG EKG Interpretation  Date/Time:  Thursday May 27 2019 10:57:41 EST Ventricular Rate:  117 PR Interval:    QRS Duration: 78 QT Interval:  292 QTC Calculation: 407 R Axis:   59 Text Interpretation: Atrial fibrillation with rapid ventricular response Low voltage QRS Cannot rule out Anterior infarct , age undetermined Abnormal ECG Confirmed by Sherwood Gambler [phone]) on 05/27/2019 11:47:12 AM   Radiology Dg Chest Portable 1 View  Result Date: 05/27/2019 CLINICAL DATA:  Patient brought in by wife stating patient c/o shortness of breath, decreased appetite, dizziness and pain all over. EXAM: PORTABLE CHEST 1 VIEW COMPARISON:  04/30/2019 and earlier exams. FINDINGS: There is significant increased opacity on the right when compared to the most recent prior chest radiograph. There is at least a moderate pleural effusion. Additional opacity right perihilar and lower lung consistent with atelectasis, pneumonia or a combination. This has also increased from the prior exam. Left lung remains clear.  No left pleural effusion. Cardiac silhouette is normal in size. No mediastinal or left hilar mass. Right hilum partly obscured. Skeletal structures are grossly intact. IMPRESSION: 1. Worsening aeration on the right compared to the most recent prior study. Previously noted right pleural effusion has enlarged. There is increased opacity in the right perihilar and right lung base is consistent with pneumonia, atelectasis or a combination, with atelectasis favored. Electronically Signed   By: Lajean Manes M.D.   On: 05/27/2019 12:07  Procedures Procedures (including critical care time) CRITICAL CARE Performed by: Rodney Booze   Total critical care time: 32 minutes  Critical care time was exclusive of separately billable procedures and treating other patients.  Critical care was necessary to treat or prevent imminent or life-threatening deterioration.  Critical care was time spent personally by me on the following activities: development of treatment plan with patient and/or surrogate as well as nursing, discussions with consultants, evaluation of patient's response to treatment, examination of patient, obtaining history from patient or surrogate, ordering and performing treatments and interventions, ordering and review of laboratory studies, ordering and review of radiographic studies, pulse oximetry and re-evaluation of patient's condition.   Medications Ordered in ED Medications  ceFEPIme (MAXIPIME) 2 g in sodium chloride 0.9 % 100 mL IVPB (2 g Intravenous New Bag/Given 05/27/19 1321)  vancomycin (VANCOCIN) 2,000 mg in sodium chloride 0.9 % 500 mL IVPB (has no administration in time range)  ceFEPIme (MAXIPIME) 2 g in sodium chloride 0.9 % 100 mL IVPB (has no administration in time range)  sodium chloride 0.9 % bolus 500 mL (500 mLs Intravenous New Bag/Given 05/27/19 1327)  sodium chloride 0.9 % bolus 1,000 mL (1,000 mLs Intravenous New Bag/Given 05/27/19 1327)     Initial Impression / Assessment and Plan / ED Course  I have reviewed the triage vital signs and the nursing notes.  Pertinent labs & imaging results that were available during my care of the patient were reviewed by me and considered in my medical decision making (see chart for details).   Final Clinical Impressions(s) / ED Diagnoses   Final diagnoses:  Pneumonia of right lung due to infectious organism, unspecified part of lung  Sepsis, due to unspecified organism, unspecified whether acute organ dysfunction present ([place])  AKI (acute  kidney injury) ([place])   78 y/o male presenting for eval of sob, cough and abd pain  Initially tachypneic, but satting well. Placed on 2l for comfort and tachypnea improved. BP stable, afebrile. HR ranging from 75 to 117 (in afib)  CBC with leukocytosis of 34k  CMP with hyponatremia, hyperkalemia, low bicarb, worsening renal function (likely prerenal)  - No piqed twaves on EKG, will give IVF and plan for recheck of k Lipase WNL Trop negative Lactic negative Blood cultures pending  EKG with atrial fibrillation with rapid ventricular response Low voltage QRS Cannot rule out Anterior infarct, age undetermined Abnormal ECG  CXR with worsening aeration on the right compared to the most recent prior study. Previously noted right pleural effusion has enlarged. There is increased opacity in the right perihilar and right lung base is consistent with pneumonia, atelectasis or a combination, with atelectasis favored.  - Given abx for community acquired pneumonia  Pt with recurrent right sided pneumonia with leukocytosis, tachypnea, who meets sepsis criteria. Broad spectrum abx given in setting of recent hospital admission and concern for hcap and aki.   1:43 PM CONSULT with Dr. Sloan Leiter who accepts patient for admission.    ED Discharge Orders    None       Rodney Booze, PA-C 05/27/19 [place], PA-C 05/27/19 1350    Sherwood Gambler, MD 05/27/19 [phone]

## 2019-05-27 NOTE — ED Notes (Signed)
Reports given to [facility] will transport after portable CXR

## 2019-05-27 NOTE — ED Triage Notes (Signed)
Patient brought in by wife stating patient c/o shortness of breath, decreased appetite, dizziness and pain all over. Patient scheduled for PET scan on Monday.

## 2019-05-28 ENCOUNTER — Inpatient Hospital Stay (HOSPITAL_COMMUNITY): Payer: Medicare Other

## 2019-05-28 DIAGNOSIS — A419 Sepsis, unspecified organism: Principal | ICD-10-CM

## 2019-05-28 DIAGNOSIS — R0600 Dyspnea, unspecified: Secondary | ICD-10-CM

## 2019-05-28 DIAGNOSIS — I714 Abdominal aortic aneurysm, without rupture, unspecified: Secondary | ICD-10-CM | POA: Diagnosis present

## 2019-05-28 DIAGNOSIS — E1169 Type 2 diabetes mellitus with other specified complication: Secondary | ICD-10-CM

## 2019-05-28 DIAGNOSIS — E785 Hyperlipidemia, unspecified: Secondary | ICD-10-CM

## 2019-05-28 DIAGNOSIS — E871 Hypo-osmolality and hyponatremia: Secondary | ICD-10-CM

## 2019-05-28 DIAGNOSIS — R0689 Other abnormalities of breathing: Secondary | ICD-10-CM

## 2019-05-28 DIAGNOSIS — N400 Enlarged prostate without lower urinary tract symptoms: Secondary | ICD-10-CM | POA: Diagnosis present

## 2019-05-28 DIAGNOSIS — R918 Other nonspecific abnormal finding of lung field: Secondary | ICD-10-CM | POA: Diagnosis present

## 2019-05-28 DIAGNOSIS — E872 Acidosis, unspecified: Secondary | ICD-10-CM | POA: Diagnosis present

## 2019-05-28 DIAGNOSIS — I480 Paroxysmal atrial fibrillation: Secondary | ICD-10-CM | POA: Diagnosis present

## 2019-05-28 DIAGNOSIS — J189 Pneumonia, unspecified organism: Secondary | ICD-10-CM

## 2019-05-28 DIAGNOSIS — N179 Acute kidney failure, unspecified: Secondary | ICD-10-CM

## 2019-05-28 LAB — CBC
HCT: 35.6 % — ABNORMAL LOW (ref 39.0–52.0)
Hemoglobin: 12.3 g/dL — ABNORMAL LOW (ref 13.0–17.0)
MCH: 30.3 pg (ref 26.0–34.0)
MCHC: 34.6 g/dL (ref 30.0–36.0)
MCV: 87.7 fL (ref 80.0–100.0)
Platelets: 213 10*3/uL (ref 150–400)
RBC: 4.06 MIL/uL — ABNORMAL LOW (ref 4.22–5.81)
RDW: 14.3 % (ref 11.5–15.5)
WBC: 32 10*3/uL — ABNORMAL HIGH (ref 4.0–10.5)
nRBC: 0 % (ref 0.0–0.2)

## 2019-05-28 LAB — URINE CULTURE: Culture: >=100000 — AB

## 2019-05-28 LAB — GLUCOSE, CAPILLARY
Glucose-Capillary: 120 mg/dL — ABNORMAL HIGH (ref 70–99)
Glucose-Capillary: 133 mg/dL — ABNORMAL HIGH (ref 70–99)
Glucose-Capillary: 120 mg/dL — ABNORMAL HIGH (ref 70–99)
Glucose-Capillary: 143 mg/dL — ABNORMAL HIGH (ref 70–99)

## 2019-05-28 LAB — BASIC METABOLIC PANEL
Anion gap: 12 (ref 5–15)
BUN: 51 mg/dL — ABNORMAL HIGH (ref 8–23)
CO2: 16 mmol/L — ABNORMAL LOW (ref 22–32)
Calcium: 7.7 mg/dL — ABNORMAL LOW (ref 8.9–10.3)
Chloride: 101 mmol/L (ref 98–111)
Creatinine, Ser: 2.02 mg/dL — ABNORMAL HIGH (ref 0.61–1.24)
GFR calc Af Amer: 36 mL/min — ABNORMAL LOW (ref >60)
GFR calc non Af Amer: 31 mL/min — ABNORMAL LOW (ref >60)
Glucose, Bld: 118 mg/dL — ABNORMAL HIGH (ref 70–99)
Potassium: 5.1 mmol/L (ref 3.5–5.1)
Sodium: 129 mmol/L — ABNORMAL LOW (ref 135–145)

## 2019-05-28 LAB — GRAM STAIN

## 2019-05-28 LAB — PH, BODY FLUID: pH, Body Fluid: 7.4

## 2019-05-28 MED ORDER — ENSURE ENLIVE PO LIQD
237.0000 mL | Freq: Two times a day (BID) | ORAL | Status: DC
  Administered 2019-05-28 – 2019-06-04 (×9): 237 mL via ORAL

## 2019-05-28 MED ORDER — HEPARIN SODIUM (PORCINE) 5000 UNIT/ML IJ SOLN
5000.0000 [IU] | Freq: Three times a day (TID) | INTRAMUSCULAR | Status: DC
  Administered 2019-05-28 – 2019-05-29 (×3): 5000 [IU] via SUBCUTANEOUS
  Filled 2019-05-28 (×3): qty 1

## 2019-05-28 MED ORDER — VANCOMYCIN HCL 10 G IV SOLR
1250.0000 mg | INTRAVENOUS | Status: DC
  Administered 2019-05-29: 1250 mg via INTRAVENOUS
  Filled 2019-05-28: qty 1250

## 2019-05-28 MED ORDER — DILTIAZEM HCL 30 MG PO TABS
30.0000 mg | ORAL_TABLET | Freq: Four times a day (QID) | ORAL | Status: DC
  Administered 2019-05-29 – 2019-06-03 (×20): 30 mg via ORAL
  Filled 2019-05-28 (×20): qty 1

## 2019-05-28 NOTE — Progress Notes (Signed)
Nutrition Follow-up  DOCUMENTATION CODES:   Obesity unspecified  INTERVENTION:  Provide Ensure Enlive po BID, each supplement provides 350 kcal and 20 grams of protein.  Encourage adequate PO intake.   NUTRITION DIAGNOSIS:   Inadequate oral intake related to poor appetite as evidenced by per patient/family report.  GOAL:   Patient will meet greater than or equal to 90% of their needs  MONITOR:   PO intake, Supplement acceptance, Skin, Weight trends, Labs, I & O's  REASON FOR ASSESSMENT:   Malnutrition Screening Tool    ASSESSMENT:   78 y.o. year old male with medical history significant for type 2 diabetes, HLD, HTN, A. fib on Eliquis, recent diagnosed malignant pleural effusion, recent hospitalization (10/15-10/19) for right-sided pneumonia complicated by parapneumonic malignant effusion based on cytology who presented on 05/27/2019 with worsening dyspnea on exertion and at rest x1 week with mild nonproductive cough.  Wife at bedside. Pt requesting to rest during time of visit. Wife reports pt with poor appetite over the past 2 weeks. Pt has been consuming 3 meals a day, however po intake has been poor. Wife reports pt would only small amounts of food at meals. Pt with no significant weight loss per weight records. RD to order nutritional supplements to aid in caloric and protein needs. Wife has been encouraging pt po intake at meals.   Unable to complete Nutrition-Focused physical exam at this time. Pt requesting to rest during RD assessment.   Labs and medications reviewed.   Diet Order:   Diet Order            Diet NPO time specified  Diet effective now        Diet Carb Modified Fluid consistency: Thin; Room service appropriate? No  Diet effective now              EDUCATION NEEDS:   Not appropriate for education at this time  Skin:  Skin Assessment: Reviewed RN Assessment  Last BM:  Unknown  Height:   Ht Readings from Last 1 Encounters:  05/27/19 5'  11" (1.803 m)    Weight:   Wt Readings from Last 1 Encounters:  05/27/19 104.3 kg    Ideal Body Weight:  78.18 kg  BMI:  Body mass index is 32.08 kg/m.  Estimated Nutritional Needs:   Kcal:  2050-2250  Protein:  105-115 grams  Fluid:  >/= 2 L/day    Corrin Parker, MS, RD, LDN Pager # [phone] After hours/ weekend pager # [phone]

## 2019-05-28 NOTE — Progress Notes (Signed)
[facility]  PROGRESS NOTE  Brian French. SJG:[id number] DOB: October 27, 1940 DOA: 05/27/2019 PCP: [facility], Dr. Domenica Fail Admit date - 05/27/2019   Admitting Physician Barb Merino, MD  Outpatient Primary MD for the patient is [facility], [facility]  LOS - 1 Brief Narrative   Brian French. is a 78 y.o. year old male with medical history significant for type 2 diabetes, HLD, HTN, A. fib on Eliquis, recent diagnosed malignant pleural effusion, recent hospitalization (10/15-10/19) for right-sided pneumonia complicated by parapneumonic malignant effusion based on cytology who presented on 05/27/2019 with worsening dyspnea on exertion and at rest x1 week with mild nonproductive cough.    In the ED patient was tachypneic 23-25, on 2 L nasal cannula, tachycardic 111 heart rate, otherwise hemodynamically stable.  Workup notable for WBC of 34.2 , Sodium 126, creatinine 2.33, BUN 50, CO2 17.  Covid test was negative.  Chest x-ray concerning for masslike opacity in the right perihilar area and enlarged right pleural effusion.  Patient admitted under [facility] for working diagnosis of acute hypoxic respiratory failure presumed secondary to HCAP complicated by worsening pleural effusion..  Patient started on empiric cefepime and vancomycin, underwent thoracentesis by IR on 11/12 with 1.3 L of bloody fluid removed.  Repeat chest x-ray showed interval improvement in effusion.  Subjective  FNAME@ [facility] today feels his breathing is getting worse and has low back pain making him uncomfortable. Slight cough, no fevers.    A & P   1. Acute hypoxic respiratory failure, multifactorial etiology-right-sided pleural effusion, concern for post obstructive pneumonia given neutrophilic dominant effusion.  Now on room air but tachypneic without distress on exam.  Remains afebrile, hemodynamically stable.  Continue to monitor blood cultures.  Empiric vancomycin and  cefepime.  2. Sepsis secondary to likely postobstructive pneumonia with underlying lung tumor with acute organ dysfunction. Still tachypneic, no current tachycardia, afebrile,no leukocytosis/leukopenia.  Does have AKI on CKD likely related to infection. Continue empiric abx, monitor cultures  3. Right-sided malignant pleural effusion, worsening. Briefly improved after thoracentesis on 11/12 but CT imaging this am shows increase in size, given likely malignant may warrant chest tube instead of recurrent thoracentesis will discuss with PCCM, CXR stat given ongoing tachypnea.  Gram stain negative, glucose 163, cytology pending  4. Masslike opacity in right perihilar region, increased in size. [facility] due to malignant cells in pleural effusion, high concern for malignancy  5. AKI on CKD stage IIIb with hyperkalemia and normal anion gap metabolic acidosis, improving.  Likely prerenal etiology related to ongoing infection/diminished intake.  Peak creatinine of 2.33, currently 2.02, baseline 1.2-1.5.  Peak potassium of 6.1 on admission, currently 5.1.  Closely monitor.  On sodium bicarb, avoid nephrotoxins  6. Acute hyponatremia, improving.  Nadir of 126 on admission, currently 129.  Likely hypovolemic hyponatremia, responded to IV fluids, closely monitor  7. Permanent atrial fibrillation, currently rate controlled.  Continue home diltiazem and Eliquis  8. Type 2 diabetes on oral hypoglycemics, hold oral meds while here, monitor CBG, sliding scale  9. Hyperlipidemia, stable.  Continue home Lipitor  10. BPH, self caths.  UA shows small leukocytes, negative nitrates.  11. 3 cm infrarenal AAA, stable size on CT on 09/07/2018     Family Communication  :  Wife updated at bedside  Code Status :  FULL  Disposition Plan  :  IV antibiotics, respiratory monitoring, further treatment for symptomatic recurrent pleural effusion, monitoring electrolytes and kidney  function  Consults  :  Will call PCCm  Procedures  :  Thoracentesis on 11/12. 1.3 L blood fluid removed  DVT Prophylaxis  :  eliquis  Lab Results  Component Value Date   PLT 213 05/28/2019    Diet :  Diet Order            Diet Carb Modified Fluid consistency: Thin; Room service appropriate? Yes  Diet effective now               Inpatient Medications Scheduled Meds:  apixaban  5 mg Oral BID   aspirin EC  81 mg Oral Daily   atorvastatin  80 mg Oral q1800   diltiazem  120 mg Oral Daily   guaiFENesin  600 mg Oral BID   insulin aspart  0-5 Units Subcutaneous QHS   insulin aspart  0-9 Units Subcutaneous TID WC   memantine  10 mg Oral QHS   pantoprazole  40 mg Oral Daily   sodium bicarbonate  650 mg Oral TID   tamsulosin  0.4 mg Oral Daily   Continuous Infusions:  sodium chloride 75 mL/hr at 05/27/19 2216   ceFEPime (MAXIPIME) IV 2 g (05/28/19 0137)   [START ON 05/29/2019] vancomycin     PRN Meds:.acetaminophen, albuterol, cyclobenzaprine, HYDROcodone-acetaminophen  Antibiotics  :   Anti-infectives (From admission, onward)   Start     Dose/Rate Route Frequency Ordered Stop   05/29/19 0600  vancomycin (VANCOCIN) 1,250 mg in sodium chloride 0.9 % 250 mL IVPB     1,250 mg 166.7 mL/hr over 90 Minutes Intravenous Every 36 hours 05/28/19 0924     05/28/19 0200  ceFEPIme (MAXIPIME) 2 g in sodium chloride 0.9 % 100 mL IVPB     2 g 200 mL/hr over 30 Minutes Intravenous Every 12 hours 05/27/19 1314     05/27/19 1300  ceFEPIme (MAXIPIME) 2 g in sodium chloride 0.9 % 100 mL IVPB     2 g 200 mL/hr over 30 Minutes Intravenous  Once 05/27/19 1258 05/27/19 1418   05/27/19 1300  vancomycin (VANCOCIN) 2,000 mg in sodium chloride 0.9 % 500 mL IVPB  Status:  Discontinued     2,000 mg 250 mL/hr over 120 Minutes Intravenous  Once 05/27/19 1258 05/28/19 0924   05/27/19 1230  cefTRIAXone (ROCEPHIN) 1 g in sodium chloride 0.9 % 100 mL IVPB  Status:  Discontinued     1 g 200  mL/hr over 30 Minutes Intravenous  Once 05/27/19 1226 05/27/19 1250   05/27/19 1230  azithromycin (ZITHROMAX) 500 mg in sodium chloride 0.9 % 250 mL IVPB  Status:  Discontinued     500 mg 250 mL/hr over 60 Minutes Intravenous  Once 05/27/19 1226 05/27/19 1250       Objective   Vitals:   05/27/19 1419 05/27/19 1901 05/27/19 2158 05/28/19 0717  BP: 131/88 126/72 123/84 124/69  Pulse: (!) 102 (!) 52  (!) 57  Resp: (!) 25  (!) 25 18  Temp:  (!) 97.5 F (36.4 C) 97.6 F (36.4 C) (!) 97.3 F (36.3 C)  TempSrc:  Oral Oral Oral  SpO2: 96% 96% 96% 95%  Weight:      Height:        SpO2: 95 % O2 Flow Rate (L/min): 4 L/min FiO2 (%): (!) 4 %  Wt Readings from Last 3 Encounters:  05/27/19 104.3 kg  05/08/19 104.3 kg  04/30/19 104.5 kg     Intake/Output Summary (Last 24 hours) at 05/28/2019 4827 Last data filed  at 05/28/2019 0800 Gross per 24 hour  Intake 1400 ml  Output 1900 ml  Net -500 ml    Physical Exam:  Awake Alert, Oriented X 3, flat affect No new F.N deficits,  [place].AT, No JVD Scattered wheezing, decreased breath sounds at right base, tachypneic without respiratory distress on room air.  +ve B.Sounds, Abd Soft, No tenderness,  No rebound, guarding or rigidity. No Cyanosis, Clubbing or edema, No new Rash or bruise     I have personally reviewed the following:   Data Reviewed:  CBC Recent Labs  Lab 05/27/19 1200 05/28/19 0345  WBC 34.2* 32.0*  HGB 13.2 12.3*  HCT 40.3 35.6*  PLT 258 213  MCV 89.2 87.7  MCH 29.2 30.3  MCHC 32.8 34.6  RDW 14.2 14.3  LYMPHSABS 1.7  --   MONOABS 3.8*  --   EOSABS 4.1*  --   BASOSABS 0.0  --     Chemistries  Recent Labs  Lab 05/27/19 1200 05/28/19 0345  NA 126* 129*  K 6.1* 5.1  CL 94* 101  CO2 17* 16*  GLUCOSE 196* 118*  BUN 50* 51*  CREATININE 2.33* 2.02*  CALCIUM 8.4* 7.7*  AST 15  --   ALT 16  --   ALKPHOS 99  --   BILITOT 0.7  --     ------------------------------------------------------------------------------------------------------------------ No results for input(s): CHOL, HDL, LDLCALC, TRIG, CHOLHDL, LDLDIRECT in the last 72 hours.  Lab Results  Component Value Date   HGBA1C 9.8 (H) 04/30/2019   ------------------------------------------------------------------------------------------------------------------ No results for input(s): TSH, T4TOTAL, T3FREE, THYROIDAB in the last 72 hours.  Invalid input(s): FREET3 ------------------------------------------------------------------------------------------------------------------ No results for input(s): VITAMINB12, FOLATE, FERRITIN, TIBC, IRON, RETICCTPCT in the last 72 hours.  Coagulation profile No results for input(s): INR, PROTIME in the last 168 hours.  No results for input(s): DDIMER in the last 72 hours.  Cardiac Enzymes No results for input(s): CKMB, TROPONINI, MYOGLOBIN in the last 168 hours.  Invalid input(s): CK ------------------------------------------------------------------------------------------------------------------ No results found for: BNP  Micro Results Recent Results (from the past 240 hour(s))  SARS CORONAVIRUS 2 (TAT 6-24 HRS) Nasopharyngeal Nasopharyngeal Swab     Status: None   Collection Time: 05/27/19 11:35 AM   Specimen: Nasopharyngeal Swab  Result Value Ref Range Status   SARS Coronavirus 2 NEGATIVE NEGATIVE Final    Comment: (NOTE) SARS-CoV-2 target nucleic acids are NOT DETECTED. The SARS-CoV-2 RNA is generally detectable in upper and lower respiratory specimens during the acute phase of infection. Negative results do not preclude SARS-CoV-2 infection, do not rule out co-infections with other pathogens, and should not be used as the sole basis for treatment or other patient management decisions. Negative results must be combined with clinical observations, patient history, and epidemiological information. The  expected result is Negative. Fact Sheet for Patients: SugarRoll.be Fact Sheet for Healthcare Providers: https://www.woods-mathews.com/ This test is not yet approved or cleared by the [place] FDA and  has been authorized for detection and/or diagnosis of SARS-CoV-2 by FDA under an Emergency Use Authorization (EUA). This EUA will remain  in effect (meaning this test can be used) for the duration of the COVID-19 declaration under Section 56 4(b)(1) of the Act, 21 U.S.C. section 360bbb-3(b)(1), unless the authorization is terminated or revoked sooner. Performed at [facility], [place] [address] [place], [place] [address]   Gram stain     Status: None   Collection Time: 05/27/19  5:10 PM   Specimen: PATH Cytology Pleural fluid  Result Value  Ref Range Status   Specimen Description FLUID PLEURAL  Final   Special Requests NONE  Final   Gram Stain   Final    RARE WBC PRESENT, PREDOMINANTLY PMN NO ORGANISMS SEEN Performed at [facility], [place] [address] [place], [place] [address]    Report Status 05/28/2019 FINAL  Final    Radiology Reports Dg Chest 1 View  Result Date: 04/30/2019 CLINICAL DATA:  Status post right thoracentesis. EXAM: CHEST  1 VIEW COMPARISON:  04/29/2019 FINDINGS: Normal heart size. Decreased right pleural effusion. No pneumothorax identified status post thoracentesis. Opacity along the right heart border is again noted corresponding to right middle lobe consolidation/atelectasis. IMPRESSION: 1. No pneumothorax status post right thoracentesis. 2. Right middle lobe consolidation/atelectasis. 3. No change in aeration to the right lung. Electronically Signed   By: Kerby Moors M.D.   On: 04/30/2019 10:32   Ct Chest Wo Contrast  Result Date: 05/28/2019 CLINICAL DATA:  History of metastatic non-small cell lung cancer. EXAM: CT CHEST WITHOUT CONTRAST TECHNIQUE: Multidetector CT imaging of the chest was performed  following the standard protocol without IV contrast. COMPARISON:  CT angio chest 04/29/2019 FINDINGS: Cardiovascular: The heart size appears normal. There is a small pericardial effusion. Stable to slightly decreased in volume from previous exam. Aortic atherosclerosis. Left main, lad, left circumflex and RCA coronary artery calcifications Mediastinum/Nodes: Normal appearance of the thyroid gland. The trachea appears patent and is midline. Normal appearance of the esophagus. 1 cm right paratracheal lymph node is identified, 5/3. Unchanged. Enlarged subcarinal lymph node is difficult to measure due to lack of IV contrast material and progressive right pleural effusion extending into the subcarinal region. The best estimate is this measures approximate 2.7 cm, image 79/3. Previously 3 cm. The hilar structures are suboptimally evaluated due to lack of IV contrast material. Lungs/Pleura: Large right pleural effusion is increased volume when compared with previous exam. There is persistent and progressive atelectasis of the right middle lobe. Presumed central obstructing lesion is difficult to identify to lack of IV contrast material. Rounded density within the right upper lobe appears smaller but progressively more solid measuring 2.5 x 2.0 cm, image 38/4. Previously 2.5 x 3.1 cm. Upper Abdomen: No acute abnormality identified. Enlarged left adrenal gland is partially imaged. Musculoskeletal: No chest wall mass or suspicious bone lesions identified. IMPRESSION: 1. Comparison to previous contrast enhanced CT of the chest from 05/08/2019 is significantly limited largely due to lack of IV contrast material. 2. Interval increase in volume of right pleural effusion. There is progressive atelectasis within the right middle lobe which may be postobstructive. Central right hilar lesion cannot be excluded. 3. Persistent enlarged subcarinal lymph node as noted on previous exam. Comparison with the prior study however is limited  due to lack of IV contrast material. 4. Right upper lobe nodular density is smaller but more solid when compared with previous exam. This is nonspecific but may represent an area progressive organizing pneumonia. Electronically Signed   By: Kerby Moors M.D.   On: 05/28/2019 07:22   Ct Angio Chest Pe W And/or Wo Contrast  Result Date: 04/29/2019 CLINICAL DATA:  Acute abdominal and back pain. EXAM: CT ANGIOGRAPHY CHEST CT ABDOMEN AND PELVIS WITH CONTRAST TECHNIQUE: Multidetector CT imaging of the chest was performed using the standard protocol during bolus administration of intravenous contrast. Multiplanar CT image reconstructions and MIPs were obtained to evaluate the vascular anatomy. Multidetector CT imaging of the abdomen and pelvis was performed using the standard protocol during  bolus administration of intravenous contrast. CONTRAST:  157mL OMNIPAQUE IOHEXOL 350 MG/ML SOLN COMPARISON:  April 25, 2019. FINDINGS: CTA CHEST FINDINGS Cardiovascular: No evidence of large central pulmonary embolus is noted. However there appears to be non opacification of a branch of the right pulmonary artery in the right middle lobe; is uncertain if this is due to pulmonary embolus or compression from surrounding atelectasis or inflammation. Normal heart size. No pericardial effusion. Atherosclerosis of thoracic aorta is noted without aneurysm formation. Coronary artery calcifications are noted. Mediastinum/Nodes: 11 mm pretracheal lymph node is noted. 3 cm subcarinal adenopathy is noted. Esophagus is unremarkable. Thyroid gland is unremarkable. Lungs/Pleura: No pneumothorax is noted. Left lung is clear. Moderate right pleural effusion is noted with adjacent subsegmental atelectasis in the right lower lobe. Right middle lobe atelectasis or pneumonia is noted. Air bronchogram is noted. 7 mm nodular density is noted in right middle lobe best seen on image number 53 of series 5. Ill-defined opacity is noted in the right  upper lobe most consistent with pneumonia. Musculoskeletal: No chest wall abnormality. No acute or significant osseous findings. Review of the MIP images confirms the above findings. CT ABDOMEN and PELVIS FINDINGS Hepatobiliary: No focal liver abnormality is seen. No gallstones, gallbladder wall thickening, or biliary dilatation. Pancreas: Unremarkable. No pancreatic ductal dilatation or surrounding inflammatory changes. Spleen: Normal in size without focal abnormality. Adrenals/Urinary Tract: 1.6 cm left adrenal nodule is noted. Right adrenal gland is unremarkable. Left renal cyst is noted. Right renal atrophy and cortical scarring is noted. No hydronephrosis or renal obstruction is noted. There are multiple rounded ill-defined low densities involving both kidneys which may represent renal infarction or possibly lobar nephronia. Urinary bladder is mildly distended with trabeculations. Stomach/Bowel: Stomach is within normal limits. Appendix appears normal. No evidence of bowel wall thickening, distention, or inflammatory changes. Vascular/Lymphatic: 3 cm infrarenal abdominal aortic aneurysm is noted. No adenopathy is noted. Reproductive: Prostate is unremarkable. Other: No abdominal wall hernia or abnormality. No abdominopelvic ascites. Musculoskeletal: No acute or significant osseous findings. Review of the MIP images confirms the above findings. IMPRESSION: There is no definite evidence of large central pulmonary embolus. However, there is noted lack of opacification of the distal portion of a branch of the right pulmonary artery in the right middle lobe; it is uncertain if this is due to pulmonary embolus or compression from adjacent atelectasis or pneumonia. Ill-defined low densities are noted throughout both kidneys, concerning for possible multiple areas of infarction or possibly pyelonephritis. Clinical correlation is recommended. Right renal atrophy and cortical scarring is noted. As noted above, large  opacity is seen in right middle lobe concerning for pneumonia or atelectasis. Endobronchial obstruction can not be excluded. Also noted is mediastinal adenopathy which may be inflammatory or neoplastic in origin. Clinical correlation is recommended. Moderate right pleural effusion is noted with adjacent subsegmental atelectasis of the right lower lobe. Probable right upper lobe pneumonia is noted. 7 mm nodule seen in right middle lobe. Non-contrast chest CT at 6-12 months is recommended. If the nodule is stable at time of repeat CT, then future CT at 18-24 months (from today's scan) is considered optional for low-risk patients, but is recommended for high-risk patients. This recommendation follows the consensus statement: Guidelines for Management of Incidental Pulmonary Nodules Detected on CT Images: From the Fleischner Society 2017; Radiology 2017; 284:228-243. Coronary artery calcifications are noted. 1.6 cm left adrenal nodule is noted. Follow-up CT scan or MRI in 12 months is recommended ensure stability. 3 cm infrarenal  abdominal aortic aneurysm. Recommend followup by ultrasound in 3 years. This recommendation follows ACR consensus guidelines: White Paper of the ACR Incidental Findings Committee II on Vascular Findings. J Am Coll Radiol 2013; 10:789-794. Aortic aneurysm NOS (ICD10-I71.9). Aortic Atherosclerosis (ICD10-I70.0). Electronically Signed   By: Marijo Conception M.D.   On: 04/29/2019 16:09   Ct Abdomen Pelvis W Contrast  Result Date: 04/29/2019 CLINICAL DATA:  Acute abdominal and back pain. EXAM: CT ANGIOGRAPHY CHEST CT ABDOMEN AND PELVIS WITH CONTRAST TECHNIQUE: Multidetector CT imaging of the chest was performed using the standard protocol during bolus administration of intravenous contrast. Multiplanar CT image reconstructions and MIPs were obtained to evaluate the vascular anatomy. Multidetector CT imaging of the abdomen and pelvis was performed using the standard protocol during bolus  administration of intravenous contrast. CONTRAST:  131mL OMNIPAQUE IOHEXOL 350 MG/ML SOLN COMPARISON:  April 25, 2019. FINDINGS: CTA CHEST FINDINGS Cardiovascular: No evidence of large central pulmonary embolus is noted. However there appears to be non opacification of a branch of the right pulmonary artery in the right middle lobe; is uncertain if this is due to pulmonary embolus or compression from surrounding atelectasis or inflammation. Normal heart size. No pericardial effusion. Atherosclerosis of thoracic aorta is noted without aneurysm formation. Coronary artery calcifications are noted. Mediastinum/Nodes: 11 mm pretracheal lymph node is noted. 3 cm subcarinal adenopathy is noted. Esophagus is unremarkable. Thyroid gland is unremarkable. Lungs/Pleura: No pneumothorax is noted. Left lung is clear. Moderate right pleural effusion is noted with adjacent subsegmental atelectasis in the right lower lobe. Right middle lobe atelectasis or pneumonia is noted. Air bronchogram is noted. 7 mm nodular density is noted in right middle lobe best seen on image number 53 of series 5. Ill-defined opacity is noted in the right upper lobe most consistent with pneumonia. Musculoskeletal: No chest wall abnormality. No acute or significant osseous findings. Review of the MIP images confirms the above findings. CT ABDOMEN and PELVIS FINDINGS Hepatobiliary: No focal liver abnormality is seen. No gallstones, gallbladder wall thickening, or biliary dilatation. Pancreas: Unremarkable. No pancreatic ductal dilatation or surrounding inflammatory changes. Spleen: Normal in size without focal abnormality. Adrenals/Urinary Tract: 1.6 cm left adrenal nodule is noted. Right adrenal gland is unremarkable. Left renal cyst is noted. Right renal atrophy and cortical scarring is noted. No hydronephrosis or renal obstruction is noted. There are multiple rounded ill-defined low densities involving both kidneys which may represent renal infarction  or possibly lobar nephronia. Urinary bladder is mildly distended with trabeculations. Stomach/Bowel: Stomach is within normal limits. Appendix appears normal. No evidence of bowel wall thickening, distention, or inflammatory changes. Vascular/Lymphatic: 3 cm infrarenal abdominal aortic aneurysm is noted. No adenopathy is noted. Reproductive: Prostate is unremarkable. Other: No abdominal wall hernia or abnormality. No abdominopelvic ascites. Musculoskeletal: No acute or significant osseous findings. Review of the MIP images confirms the above findings. IMPRESSION: There is no definite evidence of large central pulmonary embolus. However, there is noted lack of opacification of the distal portion of a branch of the right pulmonary artery in the right middle lobe; it is uncertain if this is due to pulmonary embolus or compression from adjacent atelectasis or pneumonia. Ill-defined low densities are noted throughout both kidneys, concerning for possible multiple areas of infarction or possibly pyelonephritis. Clinical correlation is recommended. Right renal atrophy and cortical scarring is noted. As noted above, large opacity is seen in right middle lobe concerning for pneumonia or atelectasis. Endobronchial obstruction can not be excluded. Also noted is mediastinal adenopathy which  may be inflammatory or neoplastic in origin. Clinical correlation is recommended. Moderate right pleural effusion is noted with adjacent subsegmental atelectasis of the right lower lobe. Probable right upper lobe pneumonia is noted. 7 mm nodule seen in right middle lobe. Non-contrast chest CT at 6-12 months is recommended. If the nodule is stable at time of repeat CT, then future CT at 18-24 months (from today's scan) is considered optional for low-risk patients, but is recommended for high-risk patients. This recommendation follows the consensus statement: Guidelines for Management of Incidental Pulmonary Nodules Detected on CT Images: From  the Fleischner Society 2017; Radiology 2017; 284:228-243. Coronary artery calcifications are noted. 1.6 cm left adrenal nodule is noted. Follow-up CT scan or MRI in 12 months is recommended ensure stability. 3 cm infrarenal abdominal aortic aneurysm. Recommend followup by ultrasound in 3 years. This recommendation follows ACR consensus guidelines: White Paper of the ACR Incidental Findings Committee II on Vascular Findings. J Am Coll Radiol 2013; 10:789-794. Aortic aneurysm NOS (ICD10-I71.9). Aortic Atherosclerosis (ICD10-I70.0). Electronically Signed   By: Marijo Conception M.D.   On: 04/29/2019 16:09   Dg Chest Port 1 View  Result Date: 05/27/2019 CLINICAL DATA:  Pleural effusion. EXAM: PORTABLE CHEST 1 VIEW COMPARISON:  Chest x-ray May 27, 2019 FINDINGS: The right-sided pleural effusion is smaller in the interval after thoracentesis. No pneumothorax. Centralized somewhat masslike opacities seen in the right perihilar region, better assessed on previous CT imaging. A left lower veins clear. The cardiomediastinal silhouette is stable. IMPRESSION: 1. Centralized somewhat masslike opacity in the right perihilar region was better assessed on previous CT imaging and is stable. 2. The right-sided pleural effusion is smaller in the interval after thoracentesis with no pneumothorax. Electronically Signed   By: Dorise Bullion III M.D   On: 05/27/2019 18:51   Dg Chest Portable 1 View  Result Date: 05/27/2019 CLINICAL DATA:  Patient brought in by wife stating patient c/o shortness of breath, decreased appetite, dizziness and pain all over. EXAM: PORTABLE CHEST 1 VIEW COMPARISON:  04/30/2019 and earlier exams. FINDINGS: There is significant increased opacity on the right when compared to the most recent prior chest radiograph. There is at least a moderate pleural effusion. Additional opacity right perihilar and lower lung consistent with atelectasis, pneumonia or a combination. This has also increased from the  prior exam. Left lung remains clear.  No left pleural effusion. Cardiac silhouette is normal in size. No mediastinal or left hilar mass. Right hilum partly obscured. Skeletal structures are grossly intact. IMPRESSION: 1. Worsening aeration on the right compared to the most recent prior study. Previously noted right pleural effusion has enlarged. There is increased opacity in the right perihilar and right lung base is consistent with pneumonia, atelectasis or a combination, with atelectasis favored. Electronically Signed   By: Lajean Manes M.D.   On: 05/27/2019 12:07   Dg Chest Portable 1 View  Result Date: 04/29/2019 CLINICAL DATA:  Flank pain. EXAM: PORTABLE CHEST 1 VIEW COMPARISON:  Body CT April 25, 2019 FINDINGS: Cardiomediastinal silhouette is normal. Mediastinal contours appear intact. Large airspace opacity in the right cardiophrenic angle. Probable right pleural effusion. Osseous structures are without acute abnormality. Soft tissues are grossly normal. IMPRESSION: 1. Large airspace opacity in the right cardiophrenic angle may represent airspace consolidation or a pulmonary mass. 2. Probable right pleural effusion. Electronically Signed   By: Fidela Salisbury M.D.   On: 04/29/2019 15:22   Ct Angio Abd/pel W And/or Wo Contrast  Result Date: 05/08/2019 CLINICAL DATA:  Question ischemia or renal infarct, recent diagnosis of atrial fibrillation, recent abnormal CT with persistent right abdominal pain. EXAM: CTA ABDOMEN AND PELVIS WITHOUT AND WITH CONTRAST TECHNIQUE: Multidetector CT imaging of the abdomen and pelvis was performed using the standard protocol during bolus administration of intravenous contrast. Multiplanar reconstructed images and MIPs were obtained and reviewed to evaluate the vascular anatomy. CONTRAST:  29mL OMNIPAQUE IOHEXOL 350 MG/ML SOLN COMPARISON:  CT 04/29/2019 FINDINGS: VASCULAR Aorta: Mixture of calcified noncalcified atherosclerotic plaque throughout the abdominal  aorta. There is fusiform abdominal aortic aneurysm measuring up to 3 cm in maximal diameter. No periaortic stranding. No occlusion or stenosis. Celiac: Ostial plaque results in mild narrowing. Minimal plaque within the proximal vessel without significant stenosis. No evidence of aneurysm, dissection or vasculitis. SMA: Ostial plaque results and mild narrowing. Minimal atheromatous plaque in the proximal vessel. No significant stenosis. No evidence of aneurysm, dissection or vasculitis. Renals: Single renal arteries bilaterally. Plaque results in at least moderate narrowing of the proximal right renal artery. Normal distal opacification. Ostial plaque results and mild narrowing of the left renal artery as well. IMA: IMA arises from the fusiform abdominal aortic aneurysm. No evidence of aneurysm, dissection or vasculitis. Inflow: No extension of the aneurysm into the proximal inflow. Calcified noncalcified plaque seen throughout the inflow vessels. No aneurysm, dissection, vasculitis or significant stenosis. Proximal Outflow: Plaque noted in the common femoral arteries and at the level of the bifurcations without significant stenosis, aneurysm or dissection Veins: Venous phase imaging reveals no significant venous abnormality. Portal and hepatic veins are patent. Review of the MIP images confirms the above findings. NON-VASCULAR Lower chest: Redemonstration of a moderate right pleural effusion with adjacent areas of passive atelectasis in the right lung base. No abnormal pleural thickening or enhancement. Small volume pericardial effusion. Cardiac size is within normal limits. Hepatobiliary: No focal liver abnormality is seen. No gallstones, gallbladder wall thickening, or biliary dilatation. Pancreas: Fatty replacement of the pancreas. No pancreatic ductal dilatation or surrounding inflammatory changes. Spleen: Normal in size without focal abnormality. Adrenals/Urinary Tract: Stable appearance of the 1.6 cm left  adrenal nodule. Right adrenal gland is unremarkable. Exophytic left renal cysts are similar to prior exam. There is moderate bilateral perinephric stranding and low-attenuation striations of the bilateral renal parenchyma. Ill-defined rounded hypoattenuating lesions involving both kidneys are suspicious for lobar nephronia or developing abscess, less likely infarct in the setting of additional inflammation and infectious features. Right extrarenal pelvis. Urothelial thickening of both collecting systems. Circumferential thickening of the urinary bladder with bladder wall trabeculations. Stomach/Bowel: Distal esophagus, stomach and duodenal sweep are unremarkable. No small bowel wall thickening or dilatation. No evidence of obstruction. A normal appendix is visualized. No colonic dilatation or wall thickening. Lymphatic: No suspicious or enlarged lymph nodes in the included lymphatic chains. Reproductive: Borderline prostatomegaly. Other: No abdominopelvic free fluid or free gas. No bowel containing hernias. Small fat containing inguinal hernias. Musculoskeletal: Multilevel degenerative changes are present in the imaged portions of the spine. Findings most pronounced at L3-4 with a calcified disc bulge resulting in moderate canal stenosis. Features likely exacerbated by what appear to be congenitally short pedicles. No acute osseous abnormality or suspicious osseous lesion. IMPRESSION: VASCULAR 1. Aortic Atherosclerosis (ICD10-I70.0). 2. Ostial plaque results in mild narrowing of the celiac, SMA, and left renal artery origins. 3. Moderate segmental narrowing of the proximal right renal artery secondary to atheromatous plaque. 4. Infrarenal abdominal aortic aneurysm measuring up 3 cm. Recommend followup by ultrasound in 3 years. This recommendation  follows ACR consensus guidelines: White Paper of the ACR Incidental Findings Committee II on Vascular Findings. J Am Coll Radiol 2013; 10:789-794. Aortic aneurysm NOS  (ICD10-I71.9) NON-VASCULAR 1. Bilateral perinephric and periureteral stranding with urothelial thickening, nephrographic striations and ill-defined rounded hypoattenuating lesions involving both kidneys, suspicious for lobar nephronia or developing abscess, less likely infarct given the additional features supporting an ascending urinary tract infection. 2. Circumferential thickening of the urinary bladder with bladder wall trabeculations, consistent with chronic outlet obstruction or neurogenic bladder. 3. Moderate right pleural effusion with adjacent areas of passive atelectasis in the right lung base. 4. Small volume pericardial effusion. These results were called by telephone at the time of interpretation on 05/08/2019 at 7:23 pm to provider [facility] , who verbally acknowledged these results. Electronically Signed   By: Lovena Le M.D.   On: 05/08/2019 19:24   Ir Thoracentesis Asp Pleural Space W/img Guide  Result Date: 04/30/2019 INDICATION: Patient with shortness of breath, right pleural effusion. Request is made for diagnostic and therapeutic thoracentesis. EXAM: ULTRASOUND GUIDED DIAGNOSTIC AND THERAPEUTIC RIGHT THORACENTESIS MEDICATIONS: 10 mL 1% lidocaine COMPLICATIONS: None immediate. PROCEDURE: An ultrasound guided thoracentesis was thoroughly discussed with the patient and questions answered. The benefits, risks, alternatives and complications were also discussed. The patient understands and wishes to proceed with the procedure. Written consent was obtained. Ultrasound was performed to localize and mark an adequate pocket of fluid in the right chest. The area was then prepped and draped in the normal sterile fashion. 1% Lidocaine was used for local anesthesia. Under ultrasound guidance a 6 Fr Safe-T-Centesis catheter was introduced. Thoracentesis was performed. The catheter was removed and a dressing applied. FINDINGS: A total of approximately 900 mL of amber fluid was removed. Samples were  sent to the laboratory as requested by the clinical team. IMPRESSION: Successful ultrasound guided diagnostic and therapeutic right thoracentesis yielding 900 mL of pleural fluid. Read by: Brynda Greathouse PA-C Electronically Signed   By: Corrie Mckusick D.O.   On: 04/30/2019 14:04     Time Spent in minutes  30     Desiree Hane M.D on 05/28/2019 at 9:55 AM  To page go to [email or web address] - password [facility]

## 2019-05-28 NOTE — Consult Note (Signed)
NAME:  Brian French., MRN:  [id number], DOB:  19-Oct-1940, LOS: 1 ADMISSION DATE:  05/27/2019, CONSULTATION DATE:  11/13 REFERRING MD:  Dr. Teryl Lucy, CHIEF COMPLAINT:  SOB, cough   Brief History   78 y/o M admitted 11/12 with worsening dyspnea and cough.   History of present illness   78 y/o M who presented to [facility] on 11/12 with reports of decreased appetite, dizziness, abdominal pain and shortness of breath.    He was admitted to the hospital 10/15-10/19 for acute pyelonephritis & suspected right sided PNA.   During that admission, he underwent a thoracentesis per IR (10/16) with malignant cells in pleural fluid. He is followed by the [facility] and is currently under evaluation by [facility] for possible cancer (unknown primary / family could not state) but no definitive diagnosis has been made.  His wife indicates he has been having trouble with his memory for approximately 6 months and was started on Namenda.  She states he has been "saying things that are off".  She notes approximately 20lbs in the last few months.   ER evaluation found him to be in [facility], tachypneic requiring O2.  CXR showed concerning for enlarged right pleural effusion.  Initial labs notable for hyponatremia (126), hyperkalemia (6.1), CO2 17, BUN 50 / Sr Cr 2.33 (up from 1.85 10/24), albumin 2.5, WBC 34.2 and platelets 258.   Subsequent CT imaging of the chest demonstrated an increase in the right pleural effusion with compressive atelectasis, persistent enlarged subcarinal lymph nodes, RUL nodular density.   PCCM consulted for evaluation of malignant effusion.   Past Medical History  DM  HLD - lipitor  HTN AF - on eliquis, ASA, cardizem Prostate Cancer - self caths  Former [facility] Events   11/12 Admit   Consults:  PCCM   Procedures:    Significant Diagnostic Tests:  ECHO 10/16 >> LVEF 50-55%, mild LVH, impaired diastolic filling, global RV systolic function normal, LA/RA size normal, trivial  pericardial effusion, trace MV regurgitation  Micro Data:  COVID 11/12 >> negative  Pleural Fluid GS 11/12 >. Negative  UC 11/12 >> 100k yeast  BCx2 11/12 >>  Pleural Fluid Culture 11/12 >>   Antimicrobials:  Cefepime 11/12 >>  Vanco 11/12 >>   Interim history/subjective:  Pt denies acute complaints.  States he can't remember if he has had any work up at the [place] for cancer.   Objective   Blood pressure 124/69, pulse 81, temperature 97.7 F (36.5 C), temperature source Oral, resp. rate 16, height 5\' 11"  (1.803 m), weight 104.3 kg, SpO2 95 %.    FiO2 (%):  [4 %] 4 %   Intake/Output Summary (Last 24 hours) at 05/28/2019 1351 Last data filed at 05/28/2019 0800 Gross per 24 hour  Intake 900 ml  Output 1100 ml  Net -200 ml   Filed Weights   05/27/19 1202  Weight: 104.3 kg    Examination: General: elderly male lying in bed in NAD HEENT: MM pink/moist, no jvd, anicteric, poor dentition Neuro: awake, alert, oriented to self/hospital, situational confusion, MAE CV: s1s2 irr irr, AF on monitor, rate 30's-70's, , no m/r/g PULM:  Even/non-labored, clear on left, diminished on right  GI: soft, bsx4 active   Extremities: warm/dry, no edema  Skin: no rashes or lesions  Resolved Hospital Problem list      Assessment & Plan:   Malignant Pleural Effusion  Former Smoker  Trivial Pericardial Effusion  Weight Loss  Hyponatremia  Former smoker  with weight loss, confusion, hyponatremia, trivial pericardial effusion and proven malignant pleural effusion.  Concerning for stage IV lung cancer.    -will plan for EBUS with fluoro, pleurX placement  -procedure planned for 11/17 at [facility] in [place], case #[id number] -hold further thoracentesis if possible as will need pleural fluid for PleurX placement  -stop eliquis in anticipation of above procedures  -follow intermittent CXR -pulmonary hygiene -IS, mobilize -O2 as needed to support sats 88-95%  AKI -per primary   AF  Bradycardia  to the 30's -per primary    Best practice:  Diet: Per primary  DVT prophylaxis: hold apixaban, change to heparin sq GI prophylaxis: PPI  Glucose control: SSI  Mobility: as tolerated  Code Status: Full Code  Family Communication: Wife updated via phone per Dr. Tamala Julian 11/13 Disposition: Per Primary  Labs   CBC: Recent Labs  Lab 05/27/19 1200 05/28/19 0345  WBC 34.2* 32.0*  NEUTROABS 24.6*  --   HGB 13.2 12.3*  HCT 40.3 35.6*  MCV 89.2 87.7  PLT 258 315    Basic Metabolic Panel: Recent Labs  Lab 05/27/19 1200 05/28/19 0345  NA 126* 129*  K 6.1* 5.1  CL 94* 101  CO2 17* 16*  GLUCOSE 196* 118*  BUN 50* 51*  CREATININE 2.33* 2.02*  CALCIUM 8.4* 7.7*   GFR: Estimated Creatinine Clearance: 37.6 mL/min (A) (by C-G formula based on SCr of 2.02 mg/dL (H)). Recent Labs  Lab 05/27/19 1200 05/27/19 1205 05/27/19 1414 05/28/19 0345  WBC 34.2*  --   --  32.0*  LATICACIDVEN  --  1.8 1.5  --     Liver Function Tests: Recent Labs  Lab 05/27/19 1200  AST 15  ALT 16  ALKPHOS 99  BILITOT 0.7  PROT 7.1  ALBUMIN 2.5*   Recent Labs  Lab 05/27/19 1200  LIPASE 30   No results for input(s): AMMONIA in the last 168 hours.  ABG No results found for: PHART, PCO2ART, PO2ART, HCO3, TCO2, ACIDBASEDEF, O2SAT   Coagulation Profile: No results for input(s): INR, PROTIME in the last 168 hours.  Cardiac Enzymes: No results for input(s): CKTOTAL, CKMB, CKMBINDEX, TROPONINI in the last 168 hours.  HbA1C: Hgb A1c MFr Bld  Date/Time Value Ref Range Status  04/30/2019 05:43 AM 9.8 (H) 4.8 - 5.6 % Final    Comment:    (NOTE) Pre diabetes:          5.7%-6.4% Diabetes:              >6.4% Glycemic control for   <7.0% adults with diabetes     CBG: Recent Labs  Lab 05/27/19 2157 05/28/19 0731 05/28/19 1116  GLUCAP 163* 120* 133*    Review of Systems:  Positives in [place]  Gen: Denies fever, chills, weight change, fatigue, night sweats HEENT: Denies blurred vision,  double vision, hearing loss, tinnitus, sinus congestion, rhinorrhea, sore throat, neck stiffness, dysphagia PULM: Denies shortness of breath, cough, sputum production, hemoptysis, wheezing CV: Denies chest pain, edema, orthopnea, paroxysmal nocturnal dyspnea, palpitations GI: Denies abdominal pain, nausea, vomiting, diarrhea, hematochezia, melena, constipation, change in bowel habits GU: Denies dysuria, hematuria, polyuria, oliguria, urethral discharge Endocrine: Denies hot or cold intolerance, polyuria, polyphagia or appetite change Derm: Denies rash, dry skin, scaling or peeling skin change Heme: Denies easy bruising, bleeding, bleeding gums Neuro: Denies headache, numbness, weakness, slurred speech, loss of memory or consciousness  Past Medical History  He,  has a past medical history of Diabetes mellitus without complication (  [place]), Headache disorder (05/22/2015), Hyperlipidemia, and Hypertension.   Surgical History    Past Surgical History:  Procedure Laterality Date  . IR THORACENTESIS ASP PLEURAL SPACE W/IMG GUIDE  04/30/2019  . right testicular swelling     had I and D after swelling s/p trauma from scrotal  swelling, got hit in the testes during judo 1962     Social History   reports that he quit smoking about 21 years ago. His smoking use included cigarettes. He smoked 2.00 packs per day. He has never used smokeless tobacco. He reports current alcohol use. He reports that he does not use drugs.   Family History   His family history includes Cancer in his mother; Heart disease in his father.   Allergies No Known Allergies   Home Medications  Prior to Admission medications   Medication Sig Start Date End Date Taking? Authorizing Provider  acetaminophen (TYLENOL) 500 MG tablet Take 1,000 mg by mouth every 6 (six) hours as needed for mild pain or headache.   Yes [provider]  apixaban (ELIQUIS) 5 MG TABS tablet Take 1 tablet (5 mg total) by mouth 2 (two) times  daily. 05/03/19 06/02/19 Yes Kyle, Tyrone A, DO  aspirin EC 81 MG tablet Take 81 mg by mouth daily.   Yes [provider]  atorvastatin (LIPITOR) 80 MG tablet Take 80 mg by mouth daily at 6 PM.   Yes [provider]  cyclobenzaprine (FLEXERIL) 10 MG tablet Take 1 tablet (10 mg total) by mouth 3 (three) times daily as needed for muscle spasms. 04/25/19  Yes Gareth Morgan, MD  diltiazem (CARDIZEM CD) 120 MG 24 hr capsule Take 1 capsule (120 mg total) by mouth daily. 05/03/19 05/02/20 Yes Kyle, Tyrone A, DO  glipiZIDE (GLUCOTROL) 10 MG tablet Take 10 mg by mouth 2 (two) times daily before a meal.   Yes [provider]  lidocaine (LIDODERM) 5 % Place 1 patch onto the skin daily. Remove & Discard patch within 12 hours or as directed by MD 04/25/19  Yes Gareth Morgan, MD  memantine (NAMENDA) 10 MG tablet Take 10 mg by mouth at bedtime.   Yes [provider]  metFORMIN (GLUCOPHAGE) 500 MG tablet Take 500 mg by mouth 2 (two) times daily with a meal.    Yes [provider]  Multiple Vitamin (MULTIVITAMIN WITH MINERALS) TABS tablet Take 1 tablet by mouth daily.   Yes [provider]  Omega-3 Fatty Acids (FISH OIL) 1000 MG CAPS Take 2,000 mg by mouth 2 (two) times daily.   Yes [provider]  omeprazole (PRILOSEC) 20 MG capsule Take 20 mg by mouth daily.   Yes [provider]  ondansetron (ZOFRAN) 8 MG tablet Take 4 mg by mouth 3 (three) times daily as needed for nausea or vomiting (30 minutes before meals).   Yes [provider]  oxyCODONE (OXY IR/ROXICODONE) 5 MG immediate release tablet Take 5 mg by mouth every 6 (six) hours as needed for severe pain.   Yes [provider]  pioglitazone (ACTOS) 45 MG tablet Take 45 mg by mouth daily.   Yes [provider]  senna (SENOKOT) 8.6 MG tablet Take 1 tablet by mouth daily.   Yes [provider]  tamsulosin (FLOMAX) 0.4 MG CAPS capsule Take 0.4 mg by  mouth daily.   Yes [provider]  HYDROcodone-acetaminophen (NORCO/VICODIN) 5-325 MG tablet Take 1-2 tablets every 6 hours as needed for severe pain Patient not taking: Reported on 05/27/2019  05/08/19   Carlisle Cater, PA-C  insulin aspart protamine- aspart (NOVOLOG MIX 70/30) (70-30) 100 UNIT/ML injection Inject 20 Units into the skin daily.    [provider]     Critical care time: n/a    Noe Gens, NP-C [facility] 05/28/2019, 1:51 PM

## 2019-05-28 NOTE — Progress Notes (Signed)
Pharmacy Antibiotic Note  Brian French. is a 78 y.o. male admitted on 05/27/2019 with pneumonia.  Pharmacy has been consulted for vancomycin and cefepime dosing.   WBC is trending down. Patient remains afebrile.  SCR is improving at 2.02 but not yet back to baseline of ~1.2 to 1.5.  Lactic acid is trending down.  Cultures are still pending.  Vancomycin 2000 mg was charted as infusing ~1800 PM on 11/12 so appears to have been given.   Plan: Vancomycin 1250 mg IV every 36 hours starting 11/14 AM.  Will continue to trend SCr and adjust therapy as indicated.  Continue Cefepime 2gm IV Q12H F/u renal fxn, C&S, clinical status and peak/trough at SS  Height: 5\' 11"  (180.3 cm) Weight: 230 lb (104.3 kg) IBW/kg (Calculated) : 75.3  Temp (24hrs), Avg:97.5 F (36.4 C), Min:97.3 F (36.3 C), Max:97.7 F (36.5 C)  Recent Labs  Lab 05/27/19 1200 05/27/19 1205 05/27/19 1414 05/28/19 0345  WBC 34.2*  --   --  32.0*  CREATININE 2.33*  --   --  2.02*  LATICACIDVEN  --  1.8 1.5  --     Estimated Creatinine Clearance: 37.6 mL/min (A) (by C-G formula based on SCr of 2.02 mg/dL (H)).    No Known Allergies  Antimicrobials this admission: Vanc 11/12>> Cefepime 11/12>>  Dose adjustments this admission: N/A  Microbiology results: 11/12 Pleural fluid gm stain- negative 11/12 Pleural fluid cx >> 11/12 COVID negative 11/12 Sputum >> 11/12 UCx >>  Thank you for allowing pharmacy to be a part of this patient's care.  Sloan Leiter, PharmD, BCPS, BCCCP Clinical Pharmacist Please refer to [facility] for [place] numbers 05/28/2019 9:25 AM

## 2019-05-28 NOTE — Progress Notes (Signed)
RT called to patient's room due to difficulty breathing, patient given a prn albuterol without any resolution. RN called and informed to page MD for additional orders.

## 2019-05-28 NOTE — Progress Notes (Signed)
[facility]  PROGRESS NOTE  Brian French. PQZ:[id number] DOB: Jan 01, 1941 DOA: 05/27/2019 PCP: [facility], Dr. Domenica Fail Admit date - 05/27/2019   Admitting Physician Barb Merino, MD  Outpatient Primary MD for the patient is [facility], [place]  LOS - 1 Brief Narrative   Brian French. is a 78 y.o. year old male with medical history significant for type 2 diabetes, HLD, HTN, A. fib on Eliquis, recent diagnosed malignant pleural effusion, recent hospitalization (10/15-10/19) for right-sided pneumonia complicated by parapneumonic malignant effusion based on cytology who presented on 05/27/2019 with worsening dyspnea on exertion and at rest x1 week with mild nonproductive cough.    In the ED patient was tachypneic 23-25, on 2 L nasal cannula, tachycardic 111 heart rate, otherwise hemodynamically stable.  Workup notable for WBC of 34.2 , Sodium 126, creatinine 2.33, BUN 50, CO2 17.  Covid test was negative.  Chest x-ray concerning for masslike opacity in the right perihilar area and enlarged right pleural effusion.  Patient admitted under [facility] for working diagnosis of acute hypoxic respiratory failure presumed secondary to HCAP complicated by worsening pleural effusion..  Patient started on empiric cefepime and vancomycin, underwent thoracentesis by IR on 11/12 with 1.3 L of bloody fluid removed.  Repeat chest x-ray showed interval improvement in effusion.  Subjective  FNAME@ [facility] today feels his breathing is getting worse and has low back pain making him uncomfortable. Slight cough, no fevers.    A & P   1. Acute hypoxic respiratory failure, multifactorial etiology-right-sided pleural effusion, concern for post obstructive pneumonia given neutrophilic dominant effusion.  Now on room air but tachypneic without distress on exam.  Remains afebrile, hemodynamically stable.  Continue to monitor blood cultures.  Empiric vancomycin and  cefepime.  2. Sepsis secondary to likely postobstructive pneumonia with underlying lung tumor with acute organ dysfunction. Still tachypneic, no current tachycardia, afebrile,no leukocytosis/leukopenia.  Does have AKI on CKD likely related to infection. Continue empiric abx, monitor cultures  3. Right-sided malignant pleural effusion, worsening. Briefly improved after thoracentesis on 11/12 but CT imaging this am shows increase in size, given likely malignant may warrant chest tube instead of recurrent thoracentesis will discuss with PCCM, CXR stat given ongoing tachypnea.  Gram stain negative, glucose 163, cytology pending  4. Masslike opacity in right perihilar region, increased in size. [facility] due to malignant cells in pleural effusion, high concern for malignancy  5. AKI on CKD stage IIIb with hyperkalemia and normal anion gap metabolic acidosis, improving.  Likely prerenal etiology related to ongoing infection/diminished intake.  Peak creatinine of 2.33, currently 2.02, baseline 1.2-1.5.  Peak potassium of 6.1 on admission, currently 5.1.  Closely monitor.  On sodium bicarb, avoid nephrotoxins  6. Acute hyponatremia, improving.  Nadir of 126 on admission, currently 129.  Likely hypovolemic hyponatremia, responded to IV fluids, closely monitor  7. Permanent atrial fibrillation, bradycardic episodes in low 50s, change from ER dilt to IR dilt 30 mg q 6 hours and monitor closely, home Eliquis to be on hold while awaiting procedures per PCCM   8. Type 2 diabetes on oral hypoglycemics, hold oral meds while here, monitor CBG, sliding scale  9. Hyperlipidemia, stable.  Continue home Lipitor  10. BPH, self caths.  UA shows small leukocytes, negative nitrates.  11. 3 cm infrarenal AAA, stable size on CT on 09/07/2018     Family Communication  :  Wife updated at bedside  Code Status :  FULL  Disposition Plan  :  IV antibiotics, respiratory monitoring, further  treatment for symptomatic recurrent pleural effusion, monitoring electrolytes and kidney function  Consults  :  Will call PCCm  Procedures  :  Thoracentesis on 11/12. 1.3 L blood fluid removed  DVT Prophylaxis  :  eliquis  Lab Results  Component Value Date   PLT 213 05/28/2019    Diet :  Diet Order            Diet NPO time specified  Diet effective now        Diet Carb Modified Fluid consistency: Thin; Room service appropriate? No  Diet effective now               Inpatient Medications Scheduled Meds:  aspirin EC  81 mg Oral Daily   atorvastatin  80 mg Oral q1800   [START ON 05/29/2019] diltiazem  30 mg Oral Q6H   feeding supplement (ENSURE ENLIVE)  237 mL Oral BID BM   guaiFENesin  600 mg Oral BID   heparin injection (subcutaneous)  5,000 Units Subcutaneous Q8H   insulin aspart  0-5 Units Subcutaneous QHS   insulin aspart  0-9 Units Subcutaneous TID WC   memantine  10 mg Oral QHS   pantoprazole  40 mg Oral Daily   sodium bicarbonate  650 mg Oral TID   tamsulosin  0.4 mg Oral Daily   Continuous Infusions:  sodium chloride 75 mL/hr at 05/28/19 1322   ceFEPime (MAXIPIME) IV 2 g (05/28/19 1323)   [START ON 05/29/2019] vancomycin     PRN Meds:.acetaminophen, albuterol, HYDROcodone-acetaminophen  Antibiotics  :   Anti-infectives (From admission, onward)   Start     Dose/Rate Route Frequency Ordered Stop   05/29/19 0600  vancomycin (VANCOCIN) 1,250 mg in sodium chloride 0.9 % 250 mL IVPB     1,250 mg 166.7 mL/hr over 90 Minutes Intravenous Every 36 hours 05/28/19 0924     05/28/19 0200  ceFEPIme (MAXIPIME) 2 g in sodium chloride 0.9 % 100 mL IVPB     2 g 200 mL/hr over 30 Minutes Intravenous Every 12 hours 05/27/19 1314     05/27/19 1300  ceFEPIme (MAXIPIME) 2 g in sodium chloride 0.9 % 100 mL IVPB     2 g 200 mL/hr over 30 Minutes Intravenous  Once 05/27/19 1258 05/27/19 1418   05/27/19 1300  vancomycin (VANCOCIN) 2,000 mg in sodium chloride 0.9 %  500 mL IVPB  Status:  Discontinued     2,000 mg 250 mL/hr over 120 Minutes Intravenous  Once 05/27/19 1258 05/28/19 0924   05/27/19 1230  cefTRIAXone (ROCEPHIN) 1 g in sodium chloride 0.9 % 100 mL IVPB  Status:  Discontinued     1 g 200 mL/hr over 30 Minutes Intravenous  Once 05/27/19 1226 05/27/19 1250   05/27/19 1230  azithromycin (ZITHROMAX) 500 mg in sodium chloride 0.9 % 250 mL IVPB  Status:  Discontinued     500 mg 250 mL/hr over 60 Minutes Intravenous  Once 05/27/19 1226 05/27/19 1250       Objective   Vitals:   05/28/19 0717 05/28/19 1147 05/28/19 1649 05/28/19 2001  BP: 124/69  (!) 101/56 (!) 105/51  Pulse: (!) 57 81 (!) 50 66  Resp: 18 16 16 20   Temp: (!) 97.3 F (36.3 C) 97.7 F (36.5 C) (!) 96.7 F (35.9 C) 97.8 F (36.6 C)  TempSrc: Oral Oral Oral Oral  SpO2: 95%  97% 98%  Weight:  Height:        SpO2: 98 % O2 Flow Rate (L/min): 4 L/min FiO2 (%): (!) 4 %  Wt Readings from Last 3 Encounters:  05/27/19 104.3 kg  05/08/19 104.3 kg  04/30/19 104.5 kg     Intake/Output Summary (Last 24 hours) at 05/28/2019 2031 Last data filed at 05/28/2019 0800 Gross per 24 hour  Intake 100 ml  Output 1100 ml  Net -1000 ml    Physical Exam:  Awake Alert, Oriented X 3, flat affect No new F.N deficits,  [place].AT, No JVD Scattered wheezing, decreased breath sounds at right base, tachypneic without respiratory distress on room air.  +ve B.Sounds, Abd Soft, No tenderness,  No rebound, guarding or rigidity. No Cyanosis, Clubbing or edema, No new Rash or bruise     I have personally reviewed the following:   Data Reviewed:  CBC Recent Labs  Lab 05/27/19 1200 05/28/19 0345  WBC 34.2* 32.0*  HGB 13.2 12.3*  HCT 40.3 35.6*  PLT 258 213  MCV 89.2 87.7  MCH 29.2 30.3  MCHC 32.8 34.6  RDW 14.2 14.3  LYMPHSABS 1.7  --   MONOABS 3.8*  --   EOSABS 4.1*  --   BASOSABS 0.0  --     Chemistries  Recent Labs  Lab 05/27/19 1200 05/28/19 0345  NA 126* 129*   K 6.1* 5.1  CL 94* 101  CO2 17* 16*  GLUCOSE 196* 118*  BUN 50* 51*  CREATININE 2.33* 2.02*  CALCIUM 8.4* 7.7*  AST 15  --   ALT 16  --   ALKPHOS 99  --   BILITOT 0.7  --    ------------------------------------------------------------------------------------------------------------------ No results for input(s): CHOL, HDL, LDLCALC, TRIG, CHOLHDL, LDLDIRECT in the last 72 hours.  Lab Results  Component Value Date   HGBA1C 9.8 (H) 04/30/2019   ------------------------------------------------------------------------------------------------------------------ No results for input(s): TSH, T4TOTAL, T3FREE, THYROIDAB in the last 72 hours.  Invalid input(s): FREET3 ------------------------------------------------------------------------------------------------------------------ No results for input(s): VITAMINB12, FOLATE, FERRITIN, TIBC, IRON, RETICCTPCT in the last 72 hours.  Coagulation profile No results for input(s): INR, PROTIME in the last 168 hours.  No results for input(s): DDIMER in the last 72 hours.  Cardiac Enzymes No results for input(s): CKMB, TROPONINI, MYOGLOBIN in the last 168 hours.  Invalid input(s): CK ------------------------------------------------------------------------------------------------------------------ No results found for: BNP  Micro Results Recent Results (from the past 240 hour(s))  SARS CORONAVIRUS 2 (TAT 6-24 HRS) Nasopharyngeal Nasopharyngeal Swab     Status: None   Collection Time: 05/27/19 11:35 AM   Specimen: Nasopharyngeal Swab  Result Value Ref Range Status   SARS Coronavirus 2 NEGATIVE NEGATIVE Final    Comment: (NOTE) SARS-CoV-2 target nucleic acids are NOT DETECTED. The SARS-CoV-2 RNA is generally detectable in upper and lower respiratory specimens during the acute phase of infection. Negative results do not preclude SARS-CoV-2 infection, do not rule out co-infections with other pathogens, and should not be used as the sole  basis for treatment or other patient management decisions. Negative results must be combined with clinical observations, patient history, and epidemiological information. The expected result is Negative. Fact Sheet for Patients: SugarRoll.be Fact Sheet for Healthcare Providers: https://www.woods-mathews.com/ This test is not yet approved or cleared by the [place] FDA and  has been authorized for detection and/or diagnosis of SARS-CoV-2 by FDA under an Emergency Use Authorization (EUA). This EUA will remain  in effect (meaning this test can be used) for the duration of the COVID-19 declaration under Section 56 4(b)(1)  of the Act, 21 U.S.C. section 360bbb-3(b)(1), unless the authorization is terminated or revoked sooner. Performed at [facility], [place] [address] [place], [place] [address]   Urine culture     Status: Abnormal   Collection Time: 05/27/19 11:37 AM   Specimen: Urine, Random  Result Value Ref Range Status   Specimen Description URINE, RANDOM  Final   Special Requests   Final    NONE Performed at [facility], [place] [address], [place], [place] [address]    Culture >=100,000 COLONIES/mL YEAST (A)  Final   Report Status 05/28/2019 FINAL  Final  Blood culture (routine x 2)     Status: None (Preliminary result)   Collection Time: 05/27/19 12:00 PM   Specimen: BLOOD  Result Value Ref Range Status   Specimen Description BLOOD RIGHT ANTECUBITAL  Final   Special Requests   Final    BOTTLES DRAWN AEROBIC AND ANAEROBIC Blood Culture adequate volume   Culture   Final    NO GROWTH < 24 HOURS Performed at [facility], [place] [address] [place], [place] [address]    Report Status PENDING  Incomplete  Blood culture (routine x 2)     Status: None (Preliminary result)   Collection Time: 05/27/19  1:18 PM   Specimen: BLOOD RIGHT HAND  Result Value Ref Range Status   Specimen Description BLOOD RIGHT HAND  Final    Special Requests   Final    BOTTLES DRAWN AEROBIC AND ANAEROBIC Blood Culture results may not be optimal due to an inadequate volume of blood received in culture bottles   Culture   Final    NO GROWTH < 24 HOURS Performed at [facility], [place] [address], [place], [place] [address]    Report Status PENDING  Incomplete  Gram stain     Status: None   Collection Time: 05/27/19  5:10 PM   Specimen: PATH Cytology Pleural fluid  Result Value Ref Range Status   Specimen Description FLUID PLEURAL  Final   Special Requests NONE  Final   Gram Stain   Final    RARE WBC PRESENT, PREDOMINANTLY PMN NO ORGANISMS SEEN Performed at [facility], [place] [address] [place], [place] [address]    Report Status 05/28/2019 FINAL  Final  Culture, body fluid-bottle     Status: None (Preliminary result)   Collection Time: 05/27/19  5:10 PM   Specimen: Fluid  Result Value Ref Range Status   Specimen Description FLUID PLEURAL  Final   Special Requests BOTTLES DRAWN AEROBIC AND ANAEROBIC  Final   Culture   Final    NO GROWTH < 24 HOURS Performed at [facility], [place] [address] [place],  [address]    Report Status PENDING  Incomplete    Radiology Reports Dg Chest 1 View  Result Date: 04/30/2019 CLINICAL DATA:  Status post right thoracentesis. EXAM: CHEST  1 VIEW COMPARISON:  04/29/2019 FINDINGS: Normal heart size. Decreased right pleural effusion. No pneumothorax identified status post thoracentesis. Opacity along the right heart border is again noted corresponding to right middle lobe consolidation/atelectasis. IMPRESSION: 1. No pneumothorax status post right thoracentesis. 2. Right middle lobe consolidation/atelectasis. 3. No change in aeration to the right lung. Electronically Signed   By: Kerby Moors M.D.   On: 04/30/2019 10:32   Ct Chest Wo Contrast  Result Date: 05/28/2019 CLINICAL DATA:  History of metastatic non-small cell lung cancer. EXAM: CT CHEST WITHOUT CONTRAST  TECHNIQUE: Multidetector CT imaging of the chest was performed following the standard protocol without IV contrast. COMPARISON:  CT angio chest 04/29/2019 FINDINGS: Cardiovascular: The heart size appears normal. There is a small pericardial effusion. Stable to slightly decreased in volume from previous exam. Aortic atherosclerosis. Left main, lad, left circumflex and RCA coronary artery calcifications Mediastinum/Nodes: Normal appearance of the thyroid gland. The trachea appears patent and is midline. Normal appearance of the esophagus. 1 cm right paratracheal lymph node is identified, 5/3. Unchanged. Enlarged subcarinal lymph node is difficult to measure due to lack of IV contrast material and progressive right pleural effusion extending into the subcarinal region. The best estimate is this measures approximate 2.7 cm, image 79/3. Previously 3 cm. The hilar structures are suboptimally evaluated due to lack of IV contrast material. Lungs/Pleura: Large right pleural effusion is increased volume when compared with previous exam. There is persistent and progressive atelectasis of the right middle lobe. Presumed central obstructing lesion is difficult to identify to lack of IV contrast material. Rounded density within the right upper lobe appears smaller but progressively more solid measuring 2.5 x 2.0 cm, image 38/4. Previously 2.5 x 3.1 cm. Upper Abdomen: No acute abnormality identified. Enlarged left adrenal gland is partially imaged. Musculoskeletal: No chest wall mass or suspicious bone lesions identified. IMPRESSION: 1. Comparison to previous contrast enhanced CT of the chest from 05/08/2019 is significantly limited largely due to lack of IV contrast material. 2. Interval increase in volume of right pleural effusion. There is progressive atelectasis within the right middle lobe which may be postobstructive. Central right hilar lesion cannot be excluded. 3. Persistent enlarged subcarinal lymph node as noted on  previous exam. Comparison with the prior study however is limited due to lack of IV contrast material. 4. Right upper lobe nodular density is smaller but more solid when compared with previous exam. This is nonspecific but may represent an area progressive organizing pneumonia. Electronically Signed   By: Kerby Moors M.D.   On: 05/28/2019 07:22   Ct Angio Chest Pe W And/or Wo Contrast  Result Date: 04/29/2019 CLINICAL DATA:  Acute abdominal and back pain. EXAM: CT ANGIOGRAPHY CHEST CT ABDOMEN AND PELVIS WITH CONTRAST TECHNIQUE: Multidetector CT imaging of the chest was performed using the standard protocol during bolus administration of intravenous contrast. Multiplanar CT image reconstructions and MIPs were obtained to evaluate the vascular anatomy. Multidetector CT imaging of the abdomen and pelvis was performed using the standard protocol during bolus administration of intravenous contrast. CONTRAST:  158mL OMNIPAQUE IOHEXOL 350 MG/ML SOLN COMPARISON:  April 25, 2019. FINDINGS: CTA CHEST FINDINGS Cardiovascular: No evidence of large central pulmonary embolus is noted. However there appears to be non opacification of a branch of the right pulmonary artery in the right middle lobe; is uncertain if this is due to pulmonary embolus or compression from surrounding atelectasis or inflammation. Normal heart size. No pericardial effusion. Atherosclerosis of thoracic aorta is noted without aneurysm formation. Coronary artery calcifications are noted. Mediastinum/Nodes: 11 mm pretracheal lymph node is noted. 3 cm subcarinal adenopathy is noted. Esophagus is unremarkable. Thyroid gland is unremarkable. Lungs/Pleura: No pneumothorax is noted. Left lung is clear. Moderate right pleural effusion is noted with adjacent subsegmental atelectasis in the right lower lobe. Right middle lobe atelectasis or pneumonia is noted. Air bronchogram is noted. 7 mm nodular density is noted in right middle lobe best seen on image  number 53 of series 5. Ill-defined opacity is noted in the right upper lobe most consistent with  pneumonia. Musculoskeletal: No chest wall abnormality. No acute or significant osseous findings. Review of the MIP images confirms the above findings. CT ABDOMEN and PELVIS FINDINGS Hepatobiliary: No focal liver abnormality is seen. No gallstones, gallbladder wall thickening, or biliary dilatation. Pancreas: Unremarkable. No pancreatic ductal dilatation or surrounding inflammatory changes. Spleen: Normal in size without focal abnormality. Adrenals/Urinary Tract: 1.6 cm left adrenal nodule is noted. Right adrenal gland is unremarkable. Left renal cyst is noted. Right renal atrophy and cortical scarring is noted. No hydronephrosis or renal obstruction is noted. There are multiple rounded ill-defined low densities involving both kidneys which may represent renal infarction or possibly lobar nephronia. Urinary bladder is mildly distended with trabeculations. Stomach/Bowel: Stomach is within normal limits. Appendix appears normal. No evidence of bowel wall thickening, distention, or inflammatory changes. Vascular/Lymphatic: 3 cm infrarenal abdominal aortic aneurysm is noted. No adenopathy is noted. Reproductive: Prostate is unremarkable. Other: No abdominal wall hernia or abnormality. No abdominopelvic ascites. Musculoskeletal: No acute or significant osseous findings. Review of the MIP images confirms the above findings. IMPRESSION: There is no definite evidence of large central pulmonary embolus. However, there is noted lack of opacification of the distal portion of a branch of the right pulmonary artery in the right middle lobe; it is uncertain if this is due to pulmonary embolus or compression from adjacent atelectasis or pneumonia. Ill-defined low densities are noted throughout both kidneys, concerning for possible multiple areas of infarction or possibly pyelonephritis. Clinical correlation is recommended. Right renal  atrophy and cortical scarring is noted. As noted above, large opacity is seen in right middle lobe concerning for pneumonia or atelectasis. Endobronchial obstruction can not be excluded. Also noted is mediastinal adenopathy which may be inflammatory or neoplastic in origin. Clinical correlation is recommended. Moderate right pleural effusion is noted with adjacent subsegmental atelectasis of the right lower lobe. Probable right upper lobe pneumonia is noted. 7 mm nodule seen in right middle lobe. Non-contrast chest CT at 6-12 months is recommended. If the nodule is stable at time of repeat CT, then future CT at 18-24 months (from today's scan) is considered optional for low-risk patients, but is recommended for high-risk patients. This recommendation follows the consensus statement: Guidelines for Management of Incidental Pulmonary Nodules Detected on CT Images: From the Fleischner Society 2017; Radiology 2017; 284:228-243. Coronary artery calcifications are noted. 1.6 cm left adrenal nodule is noted. Follow-up CT scan or MRI in 12 months is recommended ensure stability. 3 cm infrarenal abdominal aortic aneurysm. Recommend followup by ultrasound in 3 years. This recommendation follows ACR consensus guidelines: White Paper of the ACR Incidental Findings Committee II on Vascular Findings. J Am Coll Radiol 2013; 10:789-794. Aortic aneurysm NOS (ICD10-I71.9). Aortic Atherosclerosis (ICD10-I70.0). Electronically Signed   By: Marijo Conception M.D.   On: 04/29/2019 16:09   Ct Abdomen Pelvis W Contrast  Result Date: 04/29/2019 CLINICAL DATA:  Acute abdominal and back pain. EXAM: CT ANGIOGRAPHY CHEST CT ABDOMEN AND PELVIS WITH CONTRAST TECHNIQUE: Multidetector CT imaging of the chest was performed using the standard protocol during bolus administration of intravenous contrast. Multiplanar CT image reconstructions and MIPs were obtained to evaluate the vascular anatomy. Multidetector CT imaging of the abdomen and pelvis  was performed using the standard protocol during bolus administration of intravenous contrast. CONTRAST:  146mL OMNIPAQUE IOHEXOL 350 MG/ML SOLN COMPARISON:  April 25, 2019. FINDINGS: CTA CHEST FINDINGS Cardiovascular: No evidence of large central pulmonary embolus is noted. However there appears to be non opacification of a branch of the  right pulmonary artery in the right middle lobe; is uncertain if this is due to pulmonary embolus or compression from surrounding atelectasis or inflammation. Normal heart size. No pericardial effusion. Atherosclerosis of thoracic aorta is noted without aneurysm formation. Coronary artery calcifications are noted. Mediastinum/Nodes: 11 mm pretracheal lymph node is noted. 3 cm subcarinal adenopathy is noted. Esophagus is unremarkable. Thyroid gland is unremarkable. Lungs/Pleura: No pneumothorax is noted. Left lung is clear. Moderate right pleural effusion is noted with adjacent subsegmental atelectasis in the right lower lobe. Right middle lobe atelectasis or pneumonia is noted. Air bronchogram is noted. 7 mm nodular density is noted in right middle lobe best seen on image number 53 of series 5. Ill-defined opacity is noted in the right upper lobe most consistent with pneumonia. Musculoskeletal: No chest wall abnormality. No acute or significant osseous findings. Review of the MIP images confirms the above findings. CT ABDOMEN and PELVIS FINDINGS Hepatobiliary: No focal liver abnormality is seen. No gallstones, gallbladder wall thickening, or biliary dilatation. Pancreas: Unremarkable. No pancreatic ductal dilatation or surrounding inflammatory changes. Spleen: Normal in size without focal abnormality. Adrenals/Urinary Tract: 1.6 cm left adrenal nodule is noted. Right adrenal gland is unremarkable. Left renal cyst is noted. Right renal atrophy and cortical scarring is noted. No hydronephrosis or renal obstruction is noted. There are multiple rounded ill-defined low densities  involving both kidneys which may represent renal infarction or possibly lobar nephronia. Urinary bladder is mildly distended with trabeculations. Stomach/Bowel: Stomach is within normal limits. Appendix appears normal. No evidence of bowel wall thickening, distention, or inflammatory changes. Vascular/Lymphatic: 3 cm infrarenal abdominal aortic aneurysm is noted. No adenopathy is noted. Reproductive: Prostate is unremarkable. Other: No abdominal wall hernia or abnormality. No abdominopelvic ascites. Musculoskeletal: No acute or significant osseous findings. Review of the MIP images confirms the above findings. IMPRESSION: There is no definite evidence of large central pulmonary embolus. However, there is noted lack of opacification of the distal portion of a branch of the right pulmonary artery in the right middle lobe; it is uncertain if this is due to pulmonary embolus or compression from adjacent atelectasis or pneumonia. Ill-defined low densities are noted throughout both kidneys, concerning for possible multiple areas of infarction or possibly pyelonephritis. Clinical correlation is recommended. Right renal atrophy and cortical scarring is noted. As noted above, large opacity is seen in right middle lobe concerning for pneumonia or atelectasis. Endobronchial obstruction can not be excluded. Also noted is mediastinal adenopathy which may be inflammatory or neoplastic in origin. Clinical correlation is recommended. Moderate right pleural effusion is noted with adjacent subsegmental atelectasis of the right lower lobe. Probable right upper lobe pneumonia is noted. 7 mm nodule seen in right middle lobe. Non-contrast chest CT at 6-12 months is recommended. If the nodule is stable at time of repeat CT, then future CT at 18-24 months (from today's scan) is considered optional for low-risk patients, but is recommended for high-risk patients. This recommendation follows the consensus statement: Guidelines for Management  of Incidental Pulmonary Nodules Detected on CT Images: From the Fleischner Society 2017; Radiology 2017; 284:228-243. Coronary artery calcifications are noted. 1.6 cm left adrenal nodule is noted. Follow-up CT scan or MRI in 12 months is recommended ensure stability. 3 cm infrarenal abdominal aortic aneurysm. Recommend followup by ultrasound in 3 years. This recommendation follows ACR consensus guidelines: White Paper of the ACR Incidental Findings Committee II on Vascular Findings. J Am Coll Radiol 2013; 10:789-794. Aortic aneurysm NOS (ICD10-I71.9). Aortic Atherosclerosis (ICD10-I70.0). Electronically Signed  By: Marijo Conception M.D.   On: 04/29/2019 16:09   Dg Chest Port 1 View  Result Date: 05/28/2019 CLINICAL DATA:  Shortness of breath. EXAM: PORTABLE CHEST 1 VIEW COMPARISON:  05/27/2019.  CT 05/28/2019.  04/29/2019. FINDINGS: Mediastinum unchanged. Stable cardiomegaly. Dense right middle lobe atelectatic changes again noted. Underlying mass lesion again cannot be excluded. Right pleural effusion has increased in size. Reference is made to today's chest CT for further evaluation. No pneumothorax. IMPRESSION: 1. Persistent right middle lobe atelectasis. Underlying mass lesion cannot be excluded. 2. Progressive right pleural effusion. Reference is made to today's chest CT for further evaluation. Electronically Signed   By: Marcello Moores  Register   On: 05/28/2019 12:10   Dg Chest Port 1 View  Result Date: 05/27/2019 CLINICAL DATA:  Pleural effusion. EXAM: PORTABLE CHEST 1 VIEW COMPARISON:  Chest x-ray May 27, 2019 FINDINGS: The right-sided pleural effusion is smaller in the interval after thoracentesis. No pneumothorax. Centralized somewhat masslike opacities seen in the right perihilar region, better assessed on previous CT imaging. A left lower veins clear. The cardiomediastinal silhouette is stable. IMPRESSION: 1. Centralized somewhat masslike opacity in the right perihilar region was better assessed  on previous CT imaging and is stable. 2. The right-sided pleural effusion is smaller in the interval after thoracentesis with no pneumothorax. Electronically Signed   By: Dorise Bullion III M.D   On: 05/27/2019 18:51   Dg Chest Portable 1 View  Result Date: 05/27/2019 CLINICAL DATA:  Patient brought in by wife stating patient c/o shortness of breath, decreased appetite, dizziness and pain all over. EXAM: PORTABLE CHEST 1 VIEW COMPARISON:  04/30/2019 and earlier exams. FINDINGS: There is significant increased opacity on the right when compared to the most recent prior chest radiograph. There is at least a moderate pleural effusion. Additional opacity right perihilar and lower lung consistent with atelectasis, pneumonia or a combination. This has also increased from the prior exam. Left lung remains clear.  No left pleural effusion. Cardiac silhouette is normal in size. No mediastinal or left hilar mass. Right hilum partly obscured. Skeletal structures are grossly intact. IMPRESSION: 1. Worsening aeration on the right compared to the most recent prior study. Previously noted right pleural effusion has enlarged. There is increased opacity in the right perihilar and right lung base is consistent with pneumonia, atelectasis or a combination, with atelectasis favored. Electronically Signed   By: Lajean Manes M.D.   On: 05/27/2019 12:07   Dg Chest Portable 1 View  Result Date: 04/29/2019 CLINICAL DATA:  Flank pain. EXAM: PORTABLE CHEST 1 VIEW COMPARISON:  Body CT April 25, 2019 FINDINGS: Cardiomediastinal silhouette is normal. Mediastinal contours appear intact. Large airspace opacity in the right cardiophrenic angle. Probable right pleural effusion. Osseous structures are without acute abnormality. Soft tissues are grossly normal. IMPRESSION: 1. Large airspace opacity in the right cardiophrenic angle may represent airspace consolidation or a pulmonary mass. 2. Probable right pleural effusion. Electronically  Signed   By: Fidela Salisbury M.D.   On: 04/29/2019 15:22   Ct Angio Abd/pel W And/or Wo Contrast  Result Date: 05/08/2019 CLINICAL DATA:  Question ischemia or renal infarct, recent diagnosis of atrial fibrillation, recent abnormal CT with persistent right abdominal pain. EXAM: CTA ABDOMEN AND PELVIS WITHOUT AND WITH CONTRAST TECHNIQUE: Multidetector CT imaging of the abdomen and pelvis was performed using the standard protocol during bolus administration of intravenous contrast. Multiplanar reconstructed images and MIPs were obtained and reviewed to evaluate the vascular anatomy. CONTRAST:  32mL OMNIPAQUE  IOHEXOL 350 MG/ML SOLN COMPARISON:  CT 04/29/2019 FINDINGS: VASCULAR Aorta: Mixture of calcified noncalcified atherosclerotic plaque throughout the abdominal aorta. There is fusiform abdominal aortic aneurysm measuring up to 3 cm in maximal diameter. No periaortic stranding. No occlusion or stenosis. Celiac: Ostial plaque results in mild narrowing. Minimal plaque within the proximal vessel without significant stenosis. No evidence of aneurysm, dissection or vasculitis. SMA: Ostial plaque results and mild narrowing. Minimal atheromatous plaque in the proximal vessel. No significant stenosis. No evidence of aneurysm, dissection or vasculitis. Renals: Single renal arteries bilaterally. Plaque results in at least moderate narrowing of the proximal right renal artery. Normal distal opacification. Ostial plaque results and mild narrowing of the left renal artery as well. IMA: IMA arises from the fusiform abdominal aortic aneurysm. No evidence of aneurysm, dissection or vasculitis. Inflow: No extension of the aneurysm into the proximal inflow. Calcified noncalcified plaque seen throughout the inflow vessels. No aneurysm, dissection, vasculitis or significant stenosis. Proximal Outflow: Plaque noted in the common femoral arteries and at the level of the bifurcations without significant stenosis, aneurysm or  dissection Veins: Venous phase imaging reveals no significant venous abnormality. Portal and hepatic veins are patent. Review of the MIP images confirms the above findings. NON-VASCULAR Lower chest: Redemonstration of a moderate right pleural effusion with adjacent areas of passive atelectasis in the right lung base. No abnormal pleural thickening or enhancement. Small volume pericardial effusion. Cardiac size is within normal limits. Hepatobiliary: No focal liver abnormality is seen. No gallstones, gallbladder wall thickening, or biliary dilatation. Pancreas: Fatty replacement of the pancreas. No pancreatic ductal dilatation or surrounding inflammatory changes. Spleen: Normal in size without focal abnormality. Adrenals/Urinary Tract: Stable appearance of the 1.6 cm left adrenal nodule. Right adrenal gland is unremarkable. Exophytic left renal cysts are similar to prior exam. There is moderate bilateral perinephric stranding and low-attenuation striations of the bilateral renal parenchyma. Ill-defined rounded hypoattenuating lesions involving both kidneys are suspicious for lobar nephronia or developing abscess, less likely infarct in the setting of additional inflammation and infectious features. Right extrarenal pelvis. Urothelial thickening of both collecting systems. Circumferential thickening of the urinary bladder with bladder wall trabeculations. Stomach/Bowel: Distal esophagus, stomach and duodenal sweep are unremarkable. No small bowel wall thickening or dilatation. No evidence of obstruction. A normal appendix is visualized. No colonic dilatation or wall thickening. Lymphatic: No suspicious or enlarged lymph nodes in the included lymphatic chains. Reproductive: Borderline prostatomegaly. Other: No abdominopelvic free fluid or free gas. No bowel containing hernias. Small fat containing inguinal hernias. Musculoskeletal: Multilevel degenerative changes are present in the imaged portions of the spine.  Findings most pronounced at L3-4 with a calcified disc bulge resulting in moderate canal stenosis. Features likely exacerbated by what appear to be congenitally short pedicles. No acute osseous abnormality or suspicious osseous lesion. IMPRESSION: VASCULAR 1. Aortic Atherosclerosis (ICD10-I70.0). 2. Ostial plaque results in mild narrowing of the celiac, SMA, and left renal artery origins. 3. Moderate segmental narrowing of the proximal right renal artery secondary to atheromatous plaque. 4. Infrarenal abdominal aortic aneurysm measuring up 3 cm. Recommend followup by ultrasound in 3 years. This recommendation follows ACR consensus guidelines: White Paper of the ACR Incidental Findings Committee II on Vascular Findings. J Am Coll Radiol 2013; 10:789-794. Aortic aneurysm NOS (ICD10-I71.9) NON-VASCULAR 1. Bilateral perinephric and periureteral stranding with urothelial thickening, nephrographic striations and ill-defined rounded hypoattenuating lesions involving both kidneys, suspicious for lobar nephronia or developing abscess, less likely infarct given the additional features supporting an ascending urinary tract infection.  2. Circumferential thickening of the urinary bladder with bladder wall trabeculations, consistent with chronic outlet obstruction or neurogenic bladder. 3. Moderate right pleural effusion with adjacent areas of passive atelectasis in the right lung base. 4. Small volume pericardial effusion. These results were called by telephone at the time of interpretation on 05/08/2019 at 7:23 pm to provider [facility] , who verbally acknowledged these results. Electronically Signed   By: Lovena Le M.D.   On: 05/08/2019 19:24   Ir Thoracentesis Asp Pleural Space W/img Guide  Result Date: 04/30/2019 INDICATION: Patient with shortness of breath, right pleural effusion. Request is made for diagnostic and therapeutic thoracentesis. EXAM: ULTRASOUND GUIDED DIAGNOSTIC AND THERAPEUTIC RIGHT THORACENTESIS  MEDICATIONS: 10 mL 1% lidocaine COMPLICATIONS: None immediate. PROCEDURE: An ultrasound guided thoracentesis was thoroughly discussed with the patient and questions answered. The benefits, risks, alternatives and complications were also discussed. The patient understands and wishes to proceed with the procedure. Written consent was obtained. Ultrasound was performed to localize and mark an adequate pocket of fluid in the right chest. The area was then prepped and draped in the normal sterile fashion. 1% Lidocaine was used for local anesthesia. Under ultrasound guidance a 6 Fr Safe-T-Centesis catheter was introduced. Thoracentesis was performed. The catheter was removed and a dressing applied. FINDINGS: A total of approximately 900 mL of amber fluid was removed. Samples were sent to the laboratory as requested by the clinical team. IMPRESSION: Successful ultrasound guided diagnostic and therapeutic right thoracentesis yielding 900 mL of pleural fluid. Read by: Brynda Greathouse PA-C Electronically Signed   By: Corrie Mckusick D.O.   On: 04/30/2019 14:04     Time Spent in minutes  30     Desiree Hane M.D on 05/28/2019 at 8:31 PM  To page go to [email or web address] - password [facility]

## 2019-05-29 ENCOUNTER — Inpatient Hospital Stay (HOSPITAL_COMMUNITY): Payer: Medicare Other

## 2019-05-29 DIAGNOSIS — G936 Cerebral edema: Secondary | ICD-10-CM

## 2019-05-29 DIAGNOSIS — G9389 Other specified disorders of brain: Secondary | ICD-10-CM

## 2019-05-29 LAB — BASIC METABOLIC PANEL
Anion gap: 12 (ref 5–15)
BUN: 46 mg/dL — ABNORMAL HIGH (ref 8–23)
CO2: 15 mmol/L — ABNORMAL LOW (ref 22–32)
Calcium: 7.8 mg/dL — ABNORMAL LOW (ref 8.9–10.3)
Chloride: 104 mmol/L (ref 98–111)
Creatinine, Ser: 1.71 mg/dL — ABNORMAL HIGH (ref 0.61–1.24)
GFR calc Af Amer: 44 mL/min — ABNORMAL LOW (ref >60)
GFR calc non Af Amer: 38 mL/min — ABNORMAL LOW (ref >60)
Glucose, Bld: 202 mg/dL — ABNORMAL HIGH (ref 70–99)
Potassium: 4.9 mmol/L (ref 3.5–5.1)
Sodium: 131 mmol/L — ABNORMAL LOW (ref 135–145)

## 2019-05-29 LAB — GLUCOSE, CAPILLARY
Glucose-Capillary: 196 mg/dL — ABNORMAL HIGH (ref 70–99)
Glucose-Capillary: 151 mg/dL — ABNORMAL HIGH (ref 70–99)
Glucose-Capillary: 126 mg/dL — ABNORMAL HIGH (ref 70–99)
Glucose-Capillary: 144 mg/dL — ABNORMAL HIGH (ref 70–99)

## 2019-05-29 LAB — CBC
HCT: 34.1 % — ABNORMAL LOW (ref 39.0–52.0)
Hemoglobin: 11.4 g/dL — ABNORMAL LOW (ref 13.0–17.0)
MCH: 29.3 pg (ref 26.0–34.0)
MCHC: 33.4 g/dL (ref 30.0–36.0)
MCV: 87.7 fL (ref 80.0–100.0)
Platelets: 208 10*3/uL (ref 150–400)
RBC: 3.89 MIL/uL — ABNORMAL LOW (ref 4.22–5.81)
RDW: 14.5 % (ref 11.5–15.5)
WBC: 29.6 10*3/uL — ABNORMAL HIGH (ref 4.0–10.5)
nRBC: 0 % (ref 0.0–0.2)

## 2019-05-29 LAB — PROCALCITONIN: Procalcitonin: 0.55 ng/mL

## 2019-05-29 MED ORDER — VANCOMYCIN HCL 10 G IV SOLR
1250.0000 mg | INTRAVENOUS | Status: DC
  Administered 2019-05-30 – 2019-06-01 (×3): 1250 mg via INTRAVENOUS
  Filled 2019-05-29 (×4): qty 1250

## 2019-05-29 MED ORDER — GADOBUTROL 1 MMOL/ML IV SOLN
9.0000 mL | Freq: Once | INTRAVENOUS | Status: AC | PRN
  Administered 2019-05-29: 9 mL via INTRAVENOUS

## 2019-05-29 MED ORDER — DEXAMETHASONE SODIUM PHOSPHATE 4 MG/ML IJ SOLN
4.0000 mg | Freq: Four times a day (QID) | INTRAMUSCULAR | Status: DC
  Administered 2019-05-29 – 2019-05-31 (×9): 4 mg via INTRAVENOUS
  Filled 2019-05-29 (×9): qty 1

## 2019-05-29 NOTE — Progress Notes (Signed)
Pharmacy Antibiotic Note  Brian French. is a 78 y.o. male admitted on 05/27/2019 with pneumonia.  Pharmacy has been consulted for vancomycin and cefepime dosing.   Pt is afebrile, WBC elevated but trending down at 29.6. SCr improving to 1.71 (baseline 1.2-1.5).   Plan: Adjust vancomycin to 1250 mg IV every 24 hours Continue Cefepime 2gm IV Q12H F/u renal fxn, C&S, clinical status and peak/trough at SS  Height: 5\' 11"  (180.3 cm) Weight: 216 lb 11.4 oz (98.3 kg) IBW/kg (Calculated) : 75.3  Temp (24hrs), Avg:97.6 F (36.4 C), Min:96.7 F (35.9 C), Max:98.1 F (36.7 C)  Recent Labs  Lab 05/27/19 1200 05/27/19 1205 05/27/19 1414 05/28/19 0345 05/29/19 0428  WBC 34.2*  --   --  32.0* 29.6*  CREATININE 2.33*  --   --  2.02* 1.71*  LATICACIDVEN  --  1.8 1.5  --   --     Estimated Creatinine Clearance: 43.2 mL/min (A) (by C-G formula based on SCr of 1.71 mg/dL (H)).    No Known Allergies  Antimicrobials this admission: Vanc 11/12>> Cefepime 11/12>>  Microbiology results: 11/12 Pleural fluid gm stain- negative 11/12 Pleural fluid cx >> NG <24h 11/12 COVID negative 11/12 UCx: >100K yeast  Thank you for allowing pharmacy to be a part of this patient's care.  Berenice Bouton, PharmD PGY1 Pharmacy Resident Office phone: [phone] Phone until 3:30 pm: [phone] 05/29/2019 11:19 AM

## 2019-05-29 NOTE — Progress Notes (Signed)
[facility]  PROGRESS NOTE  Brian French. QPR:[id number] DOB: [birth year] DOA: 05/27/2019 PCP: [facility], Dr. Domenica Fail Admit date - 05/27/2019   Admitting Physician Brian Merino, MD  Outpatient Primary MD for the patient is [facility]  LOS - 2 Brief Narrative   Brian French. is a 78 y.o. year old male with medical history significant for type 2 diabetes, HLD, HTN, A. fib on Eliquis, recent diagnosed malignant pleural effusion, recent hospitalization (10/15-10/19) for right-sided pneumonia complicated by parapneumonic malignant effusion based on cytology who presented on 05/27/2019 with worsening dyspnea on exertion and at rest x1 week with mild nonproductive cough.    In the ED patient was tachypneic 23-25, on 2 L nasal cannula, tachycardic 111 heart rate, otherwise hemodynamically stable.  Workup notable for WBC of 34.2 , Sodium 126, creatinine 2.33, BUN 50, CO2 17.  Covid test was negative.  Chest x-ray concerning for masslike opacity in the right perihilar area and enlarged right pleural effusion.  Patient admitted under [facility] for working diagnosis of acute hypoxic respiratory failure presumed secondary to HCAP complicated by worsening pleural effusion..  Patient started on empiric cefepime and vancomycin, underwent thoracentesis by IR on 11/12 with 1.3 L of bloody fluid removed.  Repeat chest x-ray showed interval improvement in effusion.  Hospital course complicated by recurrent right-sided pleural effusion with high concern for malignancy as well as confusion with MRI brain showing right frontal lobe mass with vasogenic edema and microhemorrhaging.  Subjective  [name] feels his breathing is getting worse and has low back pain making him uncomfortable. Slight cough, no fevers.    A & P   1. Acute hypoxic respiratory failure, multifactorial etiology-right-sided malignant pleural effusion, concern for post  obstructive pneumonia given neutrophilic dominant effusion.  Stable O2 saturation on 2 L, was more tachypneic on room air on day prior.  Remains afebrile, hemodynamically stable.  Continue to monitor blood cultures.  Empiric vancomycin and cefepime.  2. Right brain mass with surrounding vasogenic edema and mild hemorrhage, most concerning for metastatic disease.[facility] Kitchen  MRI brain without contrast (4 GFR +) was obtained due to confusion.  Radiology recommends MRI brain with contrast for better evaluation, high likelihood of metastatic disease.  Patient currently alert, oriented to self and place, able to follow commands.  Discontinue subcutaneous heparin, start IV Decadron 4 mg every 6 hours, neurochecks.  3. SIRS criteria.  Met in setting of tachypnea related to malignant pleural effusion and tachycardia related to A. fib with RVR.  Patient is on empiric antibiotics for possible postobstructive pneumonia but I think this is less likely given has remained afebrile without leukocytosis or leukopenia and unremarkable blood cultures.  I believe main driver is likely malignancy, check pro-Cal, if negative discontinue antibiotics.  4. Right-sided malignant pleural effusion, worsening. Briefly improved after thoracentesis on 11/12 but CT imaging and repeat chest x-ray jon 11/13 show increase in size.  Stable respiratory status currently on 2 L, no need for thoracentesis, due to recurrence and high risk for frequent throracenteses in setting of malignacy, plan for Pleurx catheter and bronch with biopsy on 11/17 per PCCM recommendations, follow serial cXR   5. Masslike opacity in right perihilar region, increased in size in former smoker with malignant pleural effusion. [facility] due to malignant cells in pleural effusion, high concern for malignancy. Given symptoms above will get EBUS on 11/17 as inpatient  6. AKI on CKD stage IIIb with hyperkalemia and  normal anion gap metabolic acidosis,  improving.  Likely prerenal etiology related to ongoing infection/diminished intake.  Peak creatinine of 2.33, currently1.7, baseline 1.2-1.5.  Peak potassium of 6.1 on admission, currently wnl  Closely monitor.  On sodium bicarb, avoid nephrotoxins  7. Acute hyponatremia, improving.  Nadir of 126 on admission, currently 129.  Likely hypovolemic hyponatremia, responded to IV fluids, closely monitor  8. Permanent atrial fibrillation, bradycardic episodes in low 50s, change from ER dilt to IR dilt 30 mg q 6 hours and monitor closely, home Eliquis to be on hold while awaiting procedures per PCCM   9. Type 2 diabetes on oral hypoglycemics, hold oral meds while here, monitor CBG, sliding scale  10. Hyperlipidemia, stable.  Continue home Lipitor  11. BPH, self caths.  UA shows small leukocytes, negative nitrates.  12. 3 cm infrarenal AAA, stable size on CT on 09/07/2018     Family Communication  :  Wife updated at bedside  Code Status :  FULL  Disposition Plan  :  IV antibiotics, respiratory monitoring, further treatment for symptomatic recurrent pleural malignant effusion, IV Decadron for symptomatic brain mass high concern for metastatic disease, monitoring electrolytes and kidney function  Consults  :  Will call PCCm  Procedures  :  Thoracentesis on 11/12. 1.3 L blood fluid removed  DVT Prophylaxis  : SCDs  Lab Results  Component Value Date   PLT 208 05/29/2019    Diet :  Diet Order            Diet NPO time specified  Diet effective now        Diet Carb Modified Fluid consistency: Thin; Room service appropriate? No  Diet effective now               Inpatient Medications Scheduled Meds:  aspirin EC  81 mg Oral Daily   atorvastatin  80 mg Oral q1800   dexamethasone (DECADRON) injection  4 mg Intravenous Q6H   diltiazem  30 mg Oral Q6H   feeding supplement (ENSURE ENLIVE)  237 mL Oral BID BM   guaiFENesin  600 mg Oral BID   insulin aspart  0-5 Units Subcutaneous  QHS   insulin aspart  0-9 Units Subcutaneous TID WC   memantine  10 mg Oral QHS   pantoprazole  40 mg Oral Daily   sodium bicarbonate  650 mg Oral TID   tamsulosin  0.4 mg Oral Daily   Continuous Infusions:  sodium chloride 75 mL/hr at 05/29/19 0136   ceFEPime (MAXIPIME) IV 2 g (05/29/19 0136)   [START ON 05/30/2019] vancomycin     PRN Meds:.acetaminophen, albuterol, HYDROcodone-acetaminophen  Antibiotics  :   Anti-infectives (From admission, onward)   Start     Dose/Rate Route Frequency Ordered Stop   05/30/19 0600  vancomycin (VANCOCIN) 1,250 mg in sodium chloride 0.9 % 250 mL IVPB     1,250 mg 166.7 mL/hr over 90 Minutes Intravenous Every 24 hours 05/29/19 1131     05/29/19 0600  vancomycin (VANCOCIN) 1,250 mg in sodium chloride 0.9 % 250 mL IVPB  Status:  Discontinued     1,250 mg 166.7 mL/hr over 90 Minutes Intravenous Every 36 hours 05/28/19 0924 05/29/19 1131   05/28/19 0200  ceFEPIme (MAXIPIME) 2 g in sodium chloride 0.9 % 100 mL IVPB     2 g 200 mL/hr over 30 Minutes Intravenous Every 12 hours 05/27/19 1314     05/27/19 1300  ceFEPIme (MAXIPIME) 2 g in sodium chloride 0.9 % 100  mL IVPB     2 g 200 mL/hr over 30 Minutes Intravenous  Once 05/27/19 1258 05/27/19 1418   05/27/19 1300  vancomycin (VANCOCIN) 2,000 mg in sodium chloride 0.9 % 500 mL IVPB  Status:  Discontinued     2,000 mg 250 mL/hr over 120 Minutes Intravenous  Once 05/27/19 1258 05/28/19 0924   05/27/19 1230  cefTRIAXone (ROCEPHIN) 1 g in sodium chloride 0.9 % 100 mL IVPB  Status:  Discontinued     1 g 200 mL/hr over 30 Minutes Intravenous  Once 05/27/19 1226 05/27/19 1250   05/27/19 1230  azithromycin (ZITHROMAX) 500 mg in sodium chloride 0.9 % 250 mL IVPB  Status:  Discontinued     500 mg 250 mL/hr over 60 Minutes Intravenous  Once 05/27/19 1226 05/27/19 1250       Objective   Vitals:   05/28/19 2214 05/28/19 2300 05/29/19 0358 05/29/19 0729  BP:  115/69 120/74 122/80  Pulse:  66 98 96    Resp:  18 18 (!) 23  Temp:  97.6 F (36.4 C) 98.1 F (36.7 C) 97.6 F (36.4 C)  TempSrc:  Oral Oral Oral  SpO2: 100% 98% 97% 99%  Weight:   98.3 kg   Height:        SpO2: 99 % O2 Flow Rate (L/min): 4 L/min FiO2 (%): (!) 4 %  Wt Readings from Last 3 Encounters:  05/29/19 98.3 kg  05/08/19 104.3 kg  04/30/19 104.5 kg     Intake/Output Summary (Last 24 hours) at 05/29/2019 1149 Last data filed at 05/29/2019 0612 Gross per 24 hour  Intake 1583.1 ml  Output 1000 ml  Net 583.1 ml    Physical Exam:  Awake, oriented to self, place, not context.  Able to follow all commands and move extremities without notable focal deficits. [place].AT, Decreased breath sounds on right, clear breath sounds on left, normal respiratory effort on 3 L +ve B.Sounds, Abd Soft, No tenderness,  No rebound, guarding or rigidity. No Cyanosis, Clubbing or edema, No new Rash or bruise     I have personally reviewed the following:   Data Reviewed:  CBC Recent Labs  Lab 05/27/19 1200 05/28/19 0345 05/29/19 0428  WBC 34.2* 32.0* 29.6*  HGB 13.2 12.3* 11.4*  HCT 40.3 35.6* 34.1*  PLT 258 213 208  MCV 89.2 87.7 87.7  MCH 29.2 30.3 29.3  MCHC 32.8 34.6 33.4  RDW 14.2 14.3 14.5  LYMPHSABS 1.7  --   --   MONOABS 3.8*  --   --   EOSABS 4.1*  --   --   BASOSABS 0.0  --   --     Chemistries  Recent Labs  Lab 05/27/19 1200 05/28/19 0345 05/29/19 0428  NA 126* 129* 131*  K 6.1* 5.1 4.9  CL 94* 101 104  CO2 17* 16* 15*  GLUCOSE 196* 118* 202*  BUN 50* 51* 46*  CREATININE 2.33* 2.02* 1.71*  CALCIUM 8.4* 7.7* 7.8*  AST 15  --   --   ALT 16  --   --   ALKPHOS 99  --   --   BILITOT 0.7  --   --    ------------------------------------------------------------------------------------------------------------------ No results for input(s): CHOL, HDL, LDLCALC, TRIG, CHOLHDL, LDLDIRECT in the last 72 hours.  Lab Results  Component Value Date   HGBA1C 9.8 (H) 04/30/2019    ------------------------------------------------------------------------------------------------------------------ No results for input(s): TSH, T4TOTAL, T3FREE, THYROIDAB in the last 72 hours.  Invalid input(s): FREET3 ------------------------------------------------------------------------------------------------------------------ No  results for input(s): VITAMINB12, FOLATE, FERRITIN, TIBC, IRON, RETICCTPCT in the last 72 hours.  Coagulation profile No results for input(s): INR, PROTIME in the last 168 hours.  No results for input(s): DDIMER in the last 72 hours.  Cardiac Enzymes No results for input(s): CKMB, TROPONINI, MYOGLOBIN in the last 168 hours.  Invalid input(s): CK ------------------------------------------------------------------------------------------------------------------ No results found for: BNP  Micro Results Recent Results (from the past 240 hour(s))  SARS CORONAVIRUS 2 (TAT 6-24 HRS) Nasopharyngeal Nasopharyngeal Swab     Status: None   Collection Time: 05/27/19 11:35 AM   Specimen: Nasopharyngeal Swab  Result Value Ref Range Status   SARS Coronavirus 2 NEGATIVE NEGATIVE Final    Comment: (NOTE) SARS-CoV-2 target nucleic acids are NOT DETECTED. The SARS-CoV-2 RNA is generally detectable in upper and lower respiratory specimens during the acute phase of infection. Negative results do not preclude SARS-CoV-2 infection, do not rule out co-infections with other pathogens, and should not be used as the sole basis for treatment or other patient management decisions. Negative results must be combined with clinical observations, patient history, and epidemiological information. The expected result is Negative. Fact Sheet for Patients: SugarRoll.be Fact Sheet for Healthcare Providers: https://www.woods-mathews.com/ This test is not yet approved or cleared by the [place] FDA and  has been authorized for detection  and/or diagnosis of SARS-CoV-2 by FDA under an Emergency Use Authorization (EUA). This EUA will remain  in effect (meaning this test can be used) for the duration of the COVID-19 declaration under Section 56 4(b)(1) of the Act, 21 U.S.C. section 360bbb-3(b)(1), unless the authorization is terminated or revoked sooner. Performed at [facility], [place] [address], [place], [place] [address]   Urine culture     Status: Abnormal   Collection Time: 05/27/19 11:37 AM   Specimen: Urine, Random  Result Value Ref Range Status   Specimen Description URINE, RANDOM  Final   Special Requests   Final    NONE Performed at [facility], [place] [address] [place], [place] [address]    Culture >=100,000 COLONIES/mL YEAST (A)  Final   Report Status 05/28/2019 FINAL  Final  Blood culture (routine x 2)     Status: None (Preliminary result)   Collection Time: 05/27/19 12:00 PM   Specimen: BLOOD  Result Value Ref Range Status   Specimen Description BLOOD RIGHT ANTECUBITAL  Final   Special Requests   Final    BOTTLES DRAWN AEROBIC AND ANAEROBIC Blood Culture adequate volume   Culture   Final    NO GROWTH < 24 HOURS Performed at [facility], [place] [address], [place], [place] [address]    Report Status PENDING  Incomplete  Blood culture (routine x 2)     Status: None (Preliminary result)   Collection Time: 05/27/19  1:18 PM   Specimen: BLOOD RIGHT HAND  Result Value Ref Range Status   Specimen Description BLOOD RIGHT HAND  Final   Special Requests   Final    BOTTLES DRAWN AEROBIC AND ANAEROBIC Blood Culture results may not be optimal due to an inadequate volume of blood received in culture bottles   Culture   Final    NO GROWTH < 24 HOURS Performed at [facility], [place] [address] [place], [place] [address]    Report Status PENDING  Incomplete  Gram stain     Status: None   Collection Time: 05/27/19  5:10 PM   Specimen: PATH Cytology Pleural fluid  Result  Value Ref Range  Status   Specimen Description FLUID PLEURAL  Final   Special Requests NONE  Final   Gram Stain   Final    RARE WBC PRESENT, PREDOMINANTLY PMN NO ORGANISMS SEEN Performed at [facility], [place] [address], [place], [place] [address]    Report Status 05/28/2019 FINAL  Final  Culture, body fluid-bottle     Status: None (Preliminary result)   Collection Time: 05/27/19  5:10 PM   Specimen: Fluid  Result Value Ref Range Status   Specimen Description FLUID PLEURAL  Final   Special Requests BOTTLES DRAWN AEROBIC AND ANAEROBIC  Final   Culture   Final    NO GROWTH < 24 HOURS Performed at [facility], [place] [address] [place], [place] [address]    Report Status PENDING  Incomplete    Radiology Reports Dg Chest 1 View  Result Date: 04/30/2019 CLINICAL DATA:  Status post right thoracentesis. EXAM: CHEST  1 VIEW COMPARISON:  04/29/2019 FINDINGS: Normal heart size. Decreased right pleural effusion. No pneumothorax identified status post thoracentesis. Opacity along the right heart border is again noted corresponding to right middle lobe consolidation/atelectasis. IMPRESSION: 1. No pneumothorax status post right thoracentesis. 2. Right middle lobe consolidation/atelectasis. 3. No change in aeration to the right lung. Electronically Signed   By: Kerby Moors M.D.   On: 04/30/2019 10:32   Ct Chest Wo Contrast  Result Date: 05/28/2019 CLINICAL DATA:  History of metastatic non-small cell lung cancer. EXAM: CT CHEST WITHOUT CONTRAST TECHNIQUE: Multidetector CT imaging of the chest was performed following the standard protocol without IV contrast. COMPARISON:  CT angio chest 04/29/2019 FINDINGS: Cardiovascular: The heart size appears normal. There is a small pericardial effusion. Stable to slightly decreased in volume from previous exam. Aortic atherosclerosis. Left main, lad, left circumflex and RCA coronary artery calcifications Mediastinum/Nodes: Normal appearance of the thyroid gland. The  trachea appears patent and is midline. Normal appearance of the esophagus. 1 cm right paratracheal lymph node is identified, 5/3. Unchanged. Enlarged subcarinal lymph node is difficult to measure due to lack of IV contrast material and progressive right pleural effusion extending into the subcarinal region. The best estimate is this measures approximate 2.7 cm, image 79/3. Previously 3 cm. The hilar structures are suboptimally evaluated due to lack of IV contrast material. Lungs/Pleura: Large right pleural effusion is increased volume when compared with previous exam. There is persistent and progressive atelectasis of the right middle lobe. Presumed central obstructing lesion is difficult to identify to lack of IV contrast material. Rounded density within the right upper lobe appears smaller but progressively more solid measuring 2.5 x 2.0 cm, image 38/4. Previously 2.5 x 3.1 cm. Upper Abdomen: No acute abnormality identified. Enlarged left adrenal gland is partially imaged. Musculoskeletal: No chest wall mass or suspicious bone lesions identified. IMPRESSION: 1. Comparison to previous contrast enhanced CT of the chest from 05/08/2019 is significantly limited largely due to lack of IV contrast material. 2. Interval increase in volume of right pleural effusion. There is progressive atelectasis within the right middle lobe which may be postobstructive. Central right hilar lesion cannot be excluded. 3. Persistent enlarged subcarinal lymph node as noted on previous exam. Comparison with the prior study however is limited due to lack of IV contrast material. 4. Right upper lobe nodular density is smaller but more solid when compared with previous exam. This is nonspecific but may represent an area progressive organizing pneumonia. Electronically Signed   By: Kerby Moors  M.D.   On: 05/28/2019 07:22   Ct Angio Chest Pe W And/or Wo Contrast  Result Date: 04/29/2019 CLINICAL DATA:  Acute abdominal and back pain.  EXAM: CT ANGIOGRAPHY CHEST CT ABDOMEN AND PELVIS WITH CONTRAST TECHNIQUE: Multidetector CT imaging of the chest was performed using the standard protocol during bolus administration of intravenous contrast. Multiplanar CT image reconstructions and MIPs were obtained to evaluate the vascular anatomy. Multidetector CT imaging of the abdomen and pelvis was performed using the standard protocol during bolus administration of intravenous contrast. CONTRAST:  192m OMNIPAQUE IOHEXOL 350 MG/ML SOLN COMPARISON:  April 25, 2019. FINDINGS: CTA CHEST FINDINGS Cardiovascular: No evidence of large central pulmonary embolus is noted. However there appears to be non opacification of a branch of the right pulmonary artery in the right middle lobe; is uncertain if this is due to pulmonary embolus or compression from surrounding atelectasis or inflammation. Normal heart size. No pericardial effusion. Atherosclerosis of thoracic aorta is noted without aneurysm formation. Coronary artery calcifications are noted. Mediastinum/Nodes: 11 mm pretracheal lymph node is noted. 3 cm subcarinal adenopathy is noted. Esophagus is unremarkable. Thyroid gland is unremarkable. Lungs/Pleura: No pneumothorax is noted. Left lung is clear. Moderate right pleural effusion is noted with adjacent subsegmental atelectasis in the right lower lobe. Right middle lobe atelectasis or pneumonia is noted. Air bronchogram is noted. 7 mm nodular density is noted in right middle lobe best seen on image number 53 of series 5. Ill-defined opacity is noted in the right upper lobe most consistent with pneumonia. Musculoskeletal: No chest wall abnormality. No acute or significant osseous findings. Review of the MIP images confirms the above findings. CT ABDOMEN and PELVIS FINDINGS Hepatobiliary: No focal liver abnormality is seen. No gallstones, gallbladder wall thickening, or biliary dilatation. Pancreas: Unremarkable. No pancreatic ductal dilatation or surrounding  inflammatory changes. Spleen: Normal in size without focal abnormality. Adrenals/Urinary Tract: 1.6 cm left adrenal nodule is noted. Right adrenal gland is unremarkable. Left renal cyst is noted. Right renal atrophy and cortical scarring is noted. No hydronephrosis or renal obstruction is noted. There are multiple rounded ill-defined low densities involving both kidneys which may represent renal infarction or possibly lobar nephronia. Urinary bladder is mildly distended with trabeculations. Stomach/Bowel: Stomach is within normal limits. Appendix appears normal. No evidence of bowel wall thickening, distention, or inflammatory changes. Vascular/Lymphatic: 3 cm infrarenal abdominal aortic aneurysm is noted. No adenopathy is noted. Reproductive: Prostate is unremarkable. Other: No abdominal wall hernia or abnormality. No abdominopelvic ascites. Musculoskeletal: No acute or significant osseous findings. Review of the MIP images confirms the above findings. IMPRESSION: There is no definite evidence of large central pulmonary embolus. However, there is noted lack of opacification of the distal portion of a branch of the right pulmonary artery in the right middle lobe; it is uncertain if this is due to pulmonary embolus or compression from adjacent atelectasis or pneumonia. Ill-defined low densities are noted throughout both kidneys, concerning for possible multiple areas of infarction or possibly pyelonephritis. Clinical correlation is recommended. Right renal atrophy and cortical scarring is noted. As noted above, large opacity is seen in right middle lobe concerning for pneumonia or atelectasis. Endobronchial obstruction can not be excluded. Also noted is mediastinal adenopathy which may be inflammatory or neoplastic in origin. Clinical correlation is recommended. Moderate right pleural effusion is noted with adjacent subsegmental atelectasis of the right lower lobe. Probable right upper lobe pneumonia is noted. 7 mm  nodule seen in right middle lobe. Non-contrast chest CT at 6-12  months is recommended. If the nodule is stable at time of repeat CT, then future CT at 18-24 months (from today's scan) is considered optional for low-risk patients, but is recommended for high-risk patients. This recommendation follows the consensus statement: Guidelines for Management of Incidental Pulmonary Nodules Detected on CT Images: From the Fleischner Society 2017; Radiology 2017; 284:228-243. Coronary artery calcifications are noted. 1.6 cm left adrenal nodule is noted. Follow-up CT scan or MRI in 12 months is recommended ensure stability. 3 cm infrarenal abdominal aortic aneurysm. Recommend followup by ultrasound in 3 years. This recommendation follows ACR consensus guidelines: White Paper of the ACR Incidental Findings Committee II on Vascular Findings. J Am Coll Radiol 2013; 10:789-794. Aortic aneurysm NOS (ICD10-I71.9). Aortic Atherosclerosis (ICD10-I70.0). Electronically Signed   By: Marijo Conception M.D.   On: 04/29/2019 16:09   Mr Brain Wo Contrast  Result Date: 05/29/2019 CLINICAL DATA:  Combined small and non smell also lung cancer. Staging. EXAM: MRI HEAD WITHOUT CONTRAST TECHNIQUE: Multiplanar, multiecho pulse sequences of the brain and surrounding structures were obtained without intravenous contrast. COMPARISON:  MRI head 05/09/2015 FINDINGS: Brain: New lesion in the right frontal lobe with surrounding edema and mild hemorrhage. This is highly likely to be metastatic disease. Postcontrast imaging recommended to confirm. Multiple small white matter hyperintensities bilaterally are indeterminate. Small lesions in the right parietal subcortical white matter are suspicious for metastatic disease but contrast enhanced imaging is needed. Generalized atrophy.  Negative for acute infarct. Vascular: Normal arterial flow voids Skull and upper cervical spine: No focal skeletal abnormality. Sinuses/Orbits: Negative Other: Image quality  degraded by motion. Limited evaluation for metastatic disease without intravenous contrast IMPRESSION: Hemorrhagic lesion right frontal lobe less than 1 cm with surrounding edema, highly suspicious for metastatic disease. Recommend postcontrast imaging for confirmation and evaluate for other possible lesions. Generalized atrophy.  No acute infarct. Electronically Signed   By: Franchot Gallo M.D.   On: 05/29/2019 11:13   Ct Abdomen Pelvis W Contrast  Result Date: 04/29/2019 CLINICAL DATA:  Acute abdominal and back pain. EXAM: CT ANGIOGRAPHY CHEST CT ABDOMEN AND PELVIS WITH CONTRAST TECHNIQUE: Multidetector CT imaging of the chest was performed using the standard protocol during bolus administration of intravenous contrast. Multiplanar CT image reconstructions and MIPs were obtained to evaluate the vascular anatomy. Multidetector CT imaging of the abdomen and pelvis was performed using the standard protocol during bolus administration of intravenous contrast. CONTRAST:  12m OMNIPAQUE IOHEXOL 350 MG/ML SOLN COMPARISON:  April 25, 2019. FINDINGS: CTA CHEST FINDINGS Cardiovascular: No evidence of large central pulmonary embolus is noted. However there appears to be non opacification of a branch of the right pulmonary artery in the right middle lobe; is uncertain if this is due to pulmonary embolus or compression from surrounding atelectasis or inflammation. Normal heart size. No pericardial effusion. Atherosclerosis of thoracic aorta is noted without aneurysm formation. Coronary artery calcifications are noted. Mediastinum/Nodes: 11 mm pretracheal lymph node is noted. 3 cm subcarinal adenopathy is noted. Esophagus is unremarkable. Thyroid gland is unremarkable. Lungs/Pleura: No pneumothorax is noted. Left lung is clear. Moderate right pleural effusion is noted with adjacent subsegmental atelectasis in the right lower lobe. Right middle lobe atelectasis or pneumonia is noted. Air bronchogram is noted. 7 mm  nodular density is noted in right middle lobe best seen on image number 53 of series 5. Ill-defined opacity is noted in the right upper lobe most consistent with pneumonia. Musculoskeletal: No chest wall abnormality. No acute or significant osseous findings. Review of  the MIP images confirms the above findings. CT ABDOMEN and PELVIS FINDINGS Hepatobiliary: No focal liver abnormality is seen. No gallstones, gallbladder wall thickening, or biliary dilatation. Pancreas: Unremarkable. No pancreatic ductal dilatation or surrounding inflammatory changes. Spleen: Normal in size without focal abnormality. Adrenals/Urinary Tract: 1.6 cm left adrenal nodule is noted. Right adrenal gland is unremarkable. Left renal cyst is noted. Right renal atrophy and cortical scarring is noted. No hydronephrosis or renal obstruction is noted. There are multiple rounded ill-defined low densities involving both kidneys which may represent renal infarction or possibly lobar nephronia. Urinary bladder is mildly distended with trabeculations. Stomach/Bowel: Stomach is within normal limits. Appendix appears normal. No evidence of bowel wall thickening, distention, or inflammatory changes. Vascular/Lymphatic: 3 cm infrarenal abdominal aortic aneurysm is noted. No adenopathy is noted. Reproductive: Prostate is unremarkable. Other: No abdominal wall hernia or abnormality. No abdominopelvic ascites. Musculoskeletal: No acute or significant osseous findings. Review of the MIP images confirms the above findings. IMPRESSION: There is no definite evidence of large central pulmonary embolus. However, there is noted lack of opacification of the distal portion of a branch of the right pulmonary artery in the right middle lobe; it is uncertain if this is due to pulmonary embolus or compression from adjacent atelectasis or pneumonia. Ill-defined low densities are noted throughout both kidneys, concerning for possible multiple areas of infarction or possibly  pyelonephritis. Clinical correlation is recommended. Right renal atrophy and cortical scarring is noted. As noted above, large opacity is seen in right middle lobe concerning for pneumonia or atelectasis. Endobronchial obstruction can not be excluded. Also noted is mediastinal adenopathy which may be inflammatory or neoplastic in origin. Clinical correlation is recommended. Moderate right pleural effusion is noted with adjacent subsegmental atelectasis of the right lower lobe. Probable right upper lobe pneumonia is noted. 7 mm nodule seen in right middle lobe. Non-contrast chest CT at 6-12 months is recommended. If the nodule is stable at time of repeat CT, then future CT at 18-24 months (from today's scan) is considered optional for low-risk patients, but is recommended for high-risk patients. This recommendation follows the consensus statement: Guidelines for Management of Incidental Pulmonary Nodules Detected on CT Images: From the Fleischner Society 2017; Radiology 2017; 284:228-243. Coronary artery calcifications are noted. 1.6 cm left adrenal nodule is noted. Follow-up CT scan or MRI in 12 months is recommended ensure stability. 3 cm infrarenal abdominal aortic aneurysm. Recommend followup by ultrasound in 3 years. This recommendation follows ACR consensus guidelines: White Paper of the ACR Incidental Findings Committee II on Vascular Findings. J Am Coll Radiol 2013; 10:789-794. Aortic aneurysm NOS (ICD10-I71.9). Aortic Atherosclerosis (ICD10-I70.0). Electronically Signed   By: Marijo Conception M.D.   On: 04/29/2019 16:09   Dg Chest Port 1 View  Result Date: 05/28/2019 CLINICAL DATA:  Shortness of breath. EXAM: PORTABLE CHEST 1 VIEW COMPARISON:  05/27/2019.  CT 05/28/2019.  04/29/2019. FINDINGS: Mediastinum unchanged. Stable cardiomegaly. Dense right middle lobe atelectatic changes again noted. Underlying mass lesion again cannot be excluded. Right pleural effusion has increased in size. Reference is made  to today's chest CT for further evaluation. No pneumothorax. IMPRESSION: 1. Persistent right middle lobe atelectasis. Underlying mass lesion cannot be excluded. 2. Progressive right pleural effusion. Reference is made to today's chest CT for further evaluation. Electronically Signed   By: Marcello Moores  Register   On: 05/28/2019 12:10   Dg Chest Port 1 View  Result Date: 05/27/2019 CLINICAL DATA:  Pleural effusion. EXAM: PORTABLE CHEST 1 VIEW COMPARISON:  Chest  x-ray May 27, 2019 FINDINGS: The right-sided pleural effusion is smaller in the interval after thoracentesis. No pneumothorax. Centralized somewhat masslike opacities seen in the right perihilar region, better assessed on previous CT imaging. A left lower veins clear. The cardiomediastinal silhouette is stable. IMPRESSION: 1. Centralized somewhat masslike opacity in the right perihilar region was better assessed on previous CT imaging and is stable. 2. The right-sided pleural effusion is smaller in the interval after thoracentesis with no pneumothorax. Electronically Signed   By: Dorise Bullion III M.D   On: 05/27/2019 18:51   Dg Chest Portable 1 View  Result Date: 05/27/2019 CLINICAL DATA:  Patient brought in by wife stating patient c/o shortness of breath, decreased appetite, dizziness and pain all over. EXAM: PORTABLE CHEST 1 VIEW COMPARISON:  04/30/2019 and earlier exams. FINDINGS: There is significant increased opacity on the right when compared to the most recent prior chest radiograph. There is at least a moderate pleural effusion. Additional opacity right perihilar and lower lung consistent with atelectasis, pneumonia or a combination. This has also increased from the prior exam. Left lung remains clear.  No left pleural effusion. Cardiac silhouette is normal in size. No mediastinal or left hilar mass. Right hilum partly obscured. Skeletal structures are grossly intact. IMPRESSION: 1. Worsening aeration on the right compared to the most  recent prior study. Previously noted right pleural effusion has enlarged. There is increased opacity in the right perihilar and right lung base is consistent with pneumonia, atelectasis or a combination, with atelectasis favored. Electronically Signed   By: Lajean Manes M.D.   On: 05/27/2019 12:07   Dg Chest Portable 1 View  Result Date: 04/29/2019 CLINICAL DATA:  Flank pain. EXAM: PORTABLE CHEST 1 VIEW COMPARISON:  Body CT April 25, 2019 FINDINGS: Cardiomediastinal silhouette is normal. Mediastinal contours appear intact. Large airspace opacity in the right cardiophrenic angle. Probable right pleural effusion. Osseous structures are without acute abnormality. Soft tissues are grossly normal. IMPRESSION: 1. Large airspace opacity in the right cardiophrenic angle may represent airspace consolidation or a pulmonary mass. 2. Probable right pleural effusion. Electronically Signed   By: Fidela Salisbury M.D.   On: 04/29/2019 15:22   Ct Angio Abd/pel W And/or Wo Contrast  Result Date: 05/08/2019 CLINICAL DATA:  Question ischemia or renal infarct, recent diagnosis of atrial fibrillation, recent abnormal CT with persistent right abdominal pain. EXAM: CTA ABDOMEN AND PELVIS WITHOUT AND WITH CONTRAST TECHNIQUE: Multidetector CT imaging of the abdomen and pelvis was performed using the standard protocol during bolus administration of intravenous contrast. Multiplanar reconstructed images and MIPs were obtained and reviewed to evaluate the vascular anatomy. CONTRAST:  67m OMNIPAQUE IOHEXOL 350 MG/ML SOLN COMPARISON:  CT 04/29/2019 FINDINGS: VASCULAR Aorta: Mixture of calcified noncalcified atherosclerotic plaque throughout the abdominal aorta. There is fusiform abdominal aortic aneurysm measuring up to 3 cm in maximal diameter. No periaortic stranding. No occlusion or stenosis. Celiac: Ostial plaque results in mild narrowing. Minimal plaque within the proximal vessel without significant stenosis. No evidence of  aneurysm, dissection or vasculitis. SMA: Ostial plaque results and mild narrowing. Minimal atheromatous plaque in the proximal vessel. No significant stenosis. No evidence of aneurysm, dissection or vasculitis. Renals: Single renal arteries bilaterally. Plaque results in at least moderate narrowing of the proximal right renal artery. Normal distal opacification. Ostial plaque results and mild narrowing of the left renal artery as well. IMA: IMA arises from the fusiform abdominal aortic aneurysm. No evidence of aneurysm, dissection or vasculitis. Inflow: No extension of the aneurysm  into the proximal inflow. Calcified noncalcified plaque seen throughout the inflow vessels. No aneurysm, dissection, vasculitis or significant stenosis. Proximal Outflow: Plaque noted in the common femoral arteries and at the level of the bifurcations without significant stenosis, aneurysm or dissection Veins: Venous phase imaging reveals no significant venous abnormality. Portal and hepatic veins are patent. Review of the MIP images confirms the above findings. NON-VASCULAR Lower chest: Redemonstration of a moderate right pleural effusion with adjacent areas of passive atelectasis in the right lung base. No abnormal pleural thickening or enhancement. Small volume pericardial effusion. Cardiac size is within normal limits. Hepatobiliary: No focal liver abnormality is seen. No gallstones, gallbladder wall thickening, or biliary dilatation. Pancreas: Fatty replacement of the pancreas. No pancreatic ductal dilatation or surrounding inflammatory changes. Spleen: Normal in size without focal abnormality. Adrenals/Urinary Tract: Stable appearance of the 1.6 cm left adrenal nodule. Right adrenal gland is unremarkable. Exophytic left renal cysts are similar to prior exam. There is moderate bilateral perinephric stranding and low-attenuation striations of the bilateral renal parenchyma. Ill-defined rounded hypoattenuating lesions involving both  kidneys are suspicious for lobar nephronia or developing abscess, less likely infarct in the setting of additional inflammation and infectious features. Right extrarenal pelvis. Urothelial thickening of both collecting systems. Circumferential thickening of the urinary bladder with bladder wall trabeculations. Stomach/Bowel: Distal esophagus, stomach and duodenal sweep are unremarkable. No small bowel wall thickening or dilatation. No evidence of obstruction. A normal appendix is visualized. No colonic dilatation or wall thickening. Lymphatic: No suspicious or enlarged lymph nodes in the included lymphatic chains. Reproductive: Borderline prostatomegaly. Other: No abdominopelvic free fluid or free gas. No bowel containing hernias. Small fat containing inguinal hernias. Musculoskeletal: Multilevel degenerative changes are present in the imaged portions of the spine. Findings most pronounced at L3-4 with a calcified disc bulge resulting in moderate canal stenosis. Features likely exacerbated by what appear to be congenitally short pedicles. No acute osseous abnormality or suspicious osseous lesion. IMPRESSION: VASCULAR 1. Aortic Atherosclerosis (ICD10-I70.0). 2. Ostial plaque results in mild narrowing of the celiac, SMA, and left renal artery origins. 3. Moderate segmental narrowing of the proximal right renal artery secondary to atheromatous plaque. 4. Infrarenal abdominal aortic aneurysm measuring up 3 cm. Recommend followup by ultrasound in 3 years. This recommendation follows ACR consensus guidelines: White Paper of the ACR Incidental Findings Committee II on Vascular Findings. J Am Coll Radiol 2013; 10:789-794. Aortic aneurysm NOS (ICD10-I71.9) NON-VASCULAR 1. Bilateral perinephric and periureteral stranding with urothelial thickening, nephrographic striations and ill-defined rounded hypoattenuating lesions involving both kidneys, suspicious for lobar nephronia or developing abscess, less likely infarct given the  additional features supporting an ascending urinary tract infection. 2. Circumferential thickening of the urinary bladder with bladder wall trabeculations, consistent with chronic outlet obstruction or neurogenic bladder. 3. Moderate right pleural effusion with adjacent areas of passive atelectasis in the right lung base. 4. Small volume pericardial effusion. These results were called by telephone at the time of interpretation on 05/08/2019 at 7:23 pm to provider [facility] , who verbally acknowledged these results. Electronically Signed   By: Lovena Le M.D.   On: 05/08/2019 19:24   Ir Thoracentesis Asp Pleural Space W/img Guide  Result Date: 04/30/2019 INDICATION: Patient with shortness of breath, right pleural effusion. Request is made for diagnostic and therapeutic thoracentesis. EXAM: ULTRASOUND GUIDED DIAGNOSTIC AND THERAPEUTIC RIGHT THORACENTESIS MEDICATIONS: 10 mL 1% lidocaine COMPLICATIONS: None immediate. PROCEDURE: An ultrasound guided thoracentesis was thoroughly discussed with the patient and questions answered. The benefits, risks, alternatives and  complications were also discussed. The patient understands and wishes to proceed with the procedure. Written consent was obtained. Ultrasound was performed to localize and mark an adequate pocket of fluid in the right chest. The area was then prepped and draped in the normal sterile fashion. 1% Lidocaine was used for local anesthesia. Under ultrasound guidance a 6 Fr Safe-T-Centesis catheter was introduced. Thoracentesis was performed. The catheter was removed and a dressing applied. FINDINGS: A total of approximately 900 mL of amber fluid was removed. Samples were sent to the laboratory as requested by the clinical team. IMPRESSION: Successful ultrasound guided diagnostic and therapeutic right thoracentesis yielding 900 mL of pleural fluid. Read by: Brynda Greathouse PA-C Electronically Signed   By: Corrie Mckusick D.O.   On: 04/30/2019 14:04      Time Spent in minutes  30     Desiree Hane M.D on 05/29/2019 at 11:49 AM  To page go to [email or web address] - password [facility]

## 2019-05-29 NOTE — Progress Notes (Addendum)
NAME:  Brian French., MRN:  [id number], DOB:  04/10/1941, LOS: 2 ADMISSION DATE:  05/27/2019, CONSULTATION DATE:  11/13 REFERRING MD:  Dr. Teryl Lucy, CHIEF COMPLAINT:  SOB, cough   Brief History   78 y/o M admitted 11/12 with worsening dyspnea and cough.   History of present illness   78 y/o M who presented to [facility] on 11/12 with reports of decreased appetite, dizziness, abdominal pain and shortness of breath.    He was admitted to the hospital 10/15-10/19 for acute pyelonephritis & suspected right sided PNA.   During that admission, he underwent a thoracentesis per IR (10/16) with malignant cells in pleural fluid. He is followed by the [facility] and is currently under evaluation by [facility] for possible cancer (unknown primary / family could not state) but no definitive diagnosis has been made.  His wife indicates he has been having trouble with his memory for approximately 6 months and was started on Namenda.  She states he has been "saying things that are off".  She notes approximately 20lbs in the last few months.   ER evaluation found him to be in AF RVR, tachypneic requiring O2.  CXR showed concerning for enlarged right pleural effusion.  Initial labs notable for hyponatremia (126), hyperkalemia (6.1), CO2 17, BUN 50 / Sr Cr 2.33 (up from 1.85 10/24), albumin 2.5, WBC 34.2 and platelets 258.   Subsequent CT imaging of the chest demonstrated an increase in the right pleural effusion with compressive atelectasis, persistent enlarged subcarinal lymph nodes, RUL nodular density.   PCCM consulted for evaluation of malignant effusion.   Past Medical History  DM  HLD - lipitor  HTN AF - on eliquis, ASA, cardizem Prostate Cancer - self caths  Former [facility] Events   11/12 Admit   Consults:  PCCM   Procedures:    Significant Diagnostic Tests:  ECHO 10/16 >> LVEF 50-55%, mild LVH, impaired diastolic filling, global RV systolic function normal, LA/RA size normal, trivial  pericardial effusion, trace MV regurgitation  11/14 MRI brain Hemorrhagic lesion right frontal lobe less than 1 cm with surrounding edema, highly suspicious for metastatic disease. Micro Data:  COVID 11/12 >> negative  Pleural Fluid GS 11/12 >. Negative  UC 11/12 >> 100k yeast  BCx2 11/12 >> NGTD Pleural Fluid Culture 11/12 >> NGTD Pleural fluid path pending   Antimicrobials:  Cefepime 11/12 >>  Vanco 11/12 >>   Interim history/subjective:  MRI shows neurologic lesion concerning for mets.  Patient was alert and oriented and jovial today.  Remains on 2 L/min oxygen.  Objective   Blood pressure 122/80, pulse 96, temperature 97.6 F (36.4 C), temperature source Oral, resp. rate (!) 23, height 5\' 11"  (1.803 m), weight 98.3 kg, SpO2 99 %.        Intake/Output Summary (Last 24 hours) at 05/29/2019 1150 Last data filed at 05/29/2019 0612 Gross per 24 hour  Intake 1583.1 ml  Output 1000 ml  Net 583.1 ml   Filed Weights   05/27/19 1202 05/29/19 0358  Weight: 104.3 kg 98.3 kg    Examination: General: Elderly, well-developed, well nourished. NAD HENT: Normocephalic, PERRL.  Neck: No JVD. Trachea midline.  CV: RRR. S1S2. No MRG. +2 distal pulses Lungs: BBS present, clear, FNL, symmetrical ABD: +BS x4. SNT/ND. No masses, guarding or rigidity GU: No Foley EXT: MAE well. No edema Skin: PWD. In tact. No rashes or lesions Neuro: A&Ox3. CN II-XII in tact. No focal deficits    Resolved Hospital Problem  list      Assessment & Plan:   Malignant Pleural Effusion  Former Smoker  Trivial Pericardial Effusion  Weight Loss  Hyponatremia  Former smoker with weight loss, confusion, hyponatremia, trivial pericardial effusion and proven malignant pleural effusion.   Likely stage IV poorly differentiated lung cancer now with hemorrhagic lesion on MRI concerning for mets..    -EBUS with fluoro, pleurX placement  -procedure planned for 11/17 at [facility] in [place], case #[id number]  -eliquis discontinued 11/13  in anticipation of above procedures  -follow intermittent CXR -pulmonary hygiene -IS, mobilize -Wean O2 as needed to support sats greater than or equal to 90% -pleural fluid path pending    Hemorrhagic lesion right frontal lobe concerning for mets -decadron as ordered by primary  -d/c namenda   AKI -per primary   AF  No longer bradycardic. -per primary      Best practice:  Diet: Per primary  DVT prophylaxis: hold apixaban, change to heparin sq GI prophylaxis: PPI  Glucose control: SSI  Mobility: as tolerated  Code Status: Full Code  Family Communication: Wife updated in person by myself and Dr. Tamala Julian on 11/14 Disposition: Per Primary  Labs   CBC: Recent Labs  Lab 05/27/19 1200 05/28/19 0345 05/29/19 0428  WBC 34.2* 32.0* 29.6*  NEUTROABS 24.6*  --   --   HGB 13.2 12.3* 11.4*  HCT 40.3 35.6* 34.1*  MCV 89.2 87.7 87.7  PLT 258 213 338    Basic Metabolic Panel: Recent Labs  Lab 05/27/19 1200 05/28/19 0345 05/29/19 0428  NA 126* 129* 131*  K 6.1* 5.1 4.9  CL 94* 101 104  CO2 17* 16* 15*  GLUCOSE 196* 118* 202*  BUN 50* 51* 46*  CREATININE 2.33* 2.02* 1.71*  CALCIUM 8.4* 7.7* 7.8*   GFR: Estimated Creatinine Clearance: 43.2 mL/min (A) (by C-G formula based on SCr of 1.71 mg/dL (H)). Recent Labs  Lab 05/27/19 1200 05/27/19 1205 05/27/19 1414 05/28/19 0345 05/29/19 0428  WBC 34.2*  --   --  32.0* 29.6*  LATICACIDVEN  --  1.8 1.5  --   --     Liver Function Tests: Recent Labs  Lab 05/27/19 1200  AST 15  ALT 16  ALKPHOS 99  BILITOT 0.7  PROT 7.1  ALBUMIN 2.5*   Recent Labs  Lab 05/27/19 1200  LIPASE 30   No results for input(s): AMMONIA in the last 168 hours.  ABG No results found for: PHART, PCO2ART, PO2ART, HCO3, TCO2, ACIDBASEDEF, O2SAT   Coagulation Profile: No results for input(s): INR, PROTIME in the last 168 hours.  Cardiac Enzymes: No results for input(s): CKTOTAL, CKMB, CKMBINDEX, TROPONINI  in the last 168 hours.  HbA1C: Hgb A1c MFr Bld  Date/Time Value Ref Range Status  04/30/2019 05:43 AM 9.8 (H) 4.8 - 5.6 % Final    Comment:    (NOTE) Pre diabetes:          5.7%-6.4% Diabetes:              >6.4% Glycemic control for   <7.0% adults with diabetes     CBG: Recent Labs  Lab 05/28/19 0731 05/28/19 1116 05/28/19 1651 05/28/19 2117 05/29/19 0727  GLUCAP 120* 133* 120* 143* Jolley, MSN, AGACNP   Pulmonary & Critical Care

## 2019-05-30 ENCOUNTER — Inpatient Hospital Stay (HOSPITAL_COMMUNITY): Payer: Medicare Other

## 2019-05-30 DIAGNOSIS — E875 Hyperkalemia: Secondary | ICD-10-CM

## 2019-05-30 DIAGNOSIS — R062 Wheezing: Secondary | ICD-10-CM

## 2019-05-30 DIAGNOSIS — J90 Pleural effusion, not elsewhere classified: Secondary | ICD-10-CM

## 2019-05-30 LAB — GLUCOSE, CAPILLARY
Glucose-Capillary: 199 mg/dL — ABNORMAL HIGH (ref 70–99)
Glucose-Capillary: 330 mg/dL — ABNORMAL HIGH (ref 70–99)
Glucose-Capillary: 291 mg/dL — ABNORMAL HIGH (ref 70–99)
Glucose-Capillary: 306 mg/dL — ABNORMAL HIGH (ref 70–99)

## 2019-05-30 LAB — CBC
HCT: 33.8 % — ABNORMAL LOW (ref 39.0–52.0)
Hemoglobin: 11.1 g/dL — ABNORMAL LOW (ref 13.0–17.0)
MCH: 29.3 pg (ref 26.0–34.0)
MCHC: 32.8 g/dL (ref 30.0–36.0)
MCV: 89.2 fL (ref 80.0–100.0)
Platelets: 250 10*3/uL (ref 150–400)
RBC: 3.79 MIL/uL — ABNORMAL LOW (ref 4.22–5.81)
RDW: 14.7 % (ref 11.5–15.5)
WBC: 25.9 10*3/uL — ABNORMAL HIGH (ref 4.0–10.5)
nRBC: 0 % (ref 0.0–0.2)

## 2019-05-30 LAB — BASIC METABOLIC PANEL
Anion gap: 15 (ref 5–15)
Anion gap: 14 (ref 5–15)
BUN: 41 mg/dL — ABNORMAL HIGH (ref 8–23)
BUN: 46 mg/dL — ABNORMAL HIGH (ref 8–23)
CO2: 13 mmol/L — ABNORMAL LOW (ref 22–32)
CO2: 13 mmol/L — ABNORMAL LOW (ref 22–32)
Calcium: 8.1 mg/dL — ABNORMAL LOW (ref 8.9–10.3)
Calcium: 8.1 mg/dL — ABNORMAL LOW (ref 8.9–10.3)
Chloride: 104 mmol/L (ref 98–111)
Chloride: 104 mmol/L (ref 98–111)
Creatinine, Ser: 1.7 mg/dL — ABNORMAL HIGH (ref 0.61–1.24)
Creatinine, Ser: 1.61 mg/dL — ABNORMAL HIGH (ref 0.61–1.24)
GFR calc Af Amer: 44 mL/min — ABNORMAL LOW (ref >60)
GFR calc Af Amer: 47 mL/min — ABNORMAL LOW (ref >60)
GFR calc non Af Amer: 38 mL/min — ABNORMAL LOW (ref >60)
GFR calc non Af Amer: 41 mL/min — ABNORMAL LOW (ref >60)
Glucose, Bld: 216 mg/dL — ABNORMAL HIGH (ref 70–99)
Glucose, Bld: 316 mg/dL — ABNORMAL HIGH (ref 70–99)
Potassium: 5.8 mmol/L — ABNORMAL HIGH (ref 3.5–5.1)
Potassium: 5.7 mmol/L — ABNORMAL HIGH (ref 3.5–5.1)
Sodium: 132 mmol/L — ABNORMAL LOW (ref 135–145)
Sodium: 131 mmol/L — ABNORMAL LOW (ref 135–145)

## 2019-05-30 LAB — POTASSIUM
Potassium: 5.8 mmol/L — ABNORMAL HIGH (ref 3.5–5.1)
Potassium: 5.7 mmol/L — ABNORMAL HIGH (ref 3.5–5.1)

## 2019-05-30 LAB — OSMOLALITY: Osmolality: 297 mOsm/kg — ABNORMAL HIGH (ref 275–295)

## 2019-05-30 LAB — NA AND K (SODIUM & POTASSIUM), RAND UR
Potassium Urine: 33 mmol/L
Sodium, Ur: 44 mmol/L

## 2019-05-30 LAB — PROCALCITONIN: Procalcitonin: 0.41 ng/mL

## 2019-05-30 MED ORDER — INSULIN ASPART 100 UNIT/ML ~~LOC~~ SOLN
5.0000 [IU] | Freq: Three times a day (TID) | SUBCUTANEOUS | Status: DC
  Administered 2019-05-30 – 2019-05-31 (×2): 5 [IU] via SUBCUTANEOUS

## 2019-05-30 MED ORDER — LORAZEPAM 2 MG/ML IJ SOLN
0.5000 mg | Freq: Once | INTRAMUSCULAR | Status: AC | PRN
  Administered 2019-05-30: 0.5 mg via INTRAVENOUS
  Filled 2019-05-30: qty 1

## 2019-05-30 MED ORDER — LORAZEPAM 2 MG/ML IJ SOLN
0.5000 mg | Freq: Once | INTRAMUSCULAR | Status: AC
  Administered 2019-05-30: 0.5 mg via INTRAVENOUS
  Filled 2019-05-30: qty 1

## 2019-05-30 MED ORDER — SODIUM ZIRCONIUM CYCLOSILICATE 10 G PO PACK
10.0000 g | PACK | Freq: Once | ORAL | Status: AC
  Administered 2019-05-30: 10 g via ORAL
  Filled 2019-05-30: qty 1

## 2019-05-30 MED ORDER — IPRATROPIUM-ALBUTEROL 0.5-2.5 (3) MG/3ML IN SOLN
3.0000 mL | RESPIRATORY_TRACT | Status: DC | PRN
  Administered 2019-05-30 – 2019-05-31 (×3): 3 mL via RESPIRATORY_TRACT
  Filled 2019-05-30 (×3): qty 3

## 2019-05-30 MED ORDER — HALOPERIDOL LACTATE 5 MG/ML IJ SOLN
2.5000 mg | Freq: Once | INTRAMUSCULAR | Status: AC | PRN
  Administered 2019-05-30: 2.5 mg via INTRAVENOUS
  Filled 2019-05-30: qty 1

## 2019-05-30 NOTE — Progress Notes (Addendum)
[facility]  PROGRESS NOTE  Brian French. IDP:[id number] DOB: 1940-11-11 DOA: 05/27/2019 PCP: [facility], Dr. Domenica Fail Admit date - 05/27/2019   Admitting Physician Barb Merino, MD  Outpatient Primary MD for the patient is [facility] Brief Narrative   Brian French. is a 78 y.o. year old male with medical history significant for type 2 diabetes, HLD, HTN, A. fib on Eliquis, recent diagnosed malignant pleural effusion, recent hospitalization (10/15-10/19) for right-sided pneumonia complicated by parapneumonic malignant effusion based on cytology who presented on 05/27/2019 with worsening dyspnea on exertion and at rest x1 week with mild nonproductive cough.    In the ED patient was tachypneic 23-25, on 2 L nasal cannula, tachycardic 111 heart rate, otherwise hemodynamically stable.  Workup notable for WBC of 34.2 , Sodium 126, creatinine 2.33, BUN 50, CO2 17.  Covid test was negative.  Chest x-ray concerning for masslike opacity in the right perihilar area and enlarged right pleural effusion.  Patient admitted under [facility] for working diagnosis of acute hypoxic respiratory failure presumed secondary to HCAP complicated by worsening pleural effusion..  Patient started on empiric cefepime and vancomycin, underwent thoracentesis by IR on 11/12 with 1.3 L of bloody fluid removed.  Repeat chest x-ray showed interval improvement in effusion.  Hospital course complicated by recurrent right-sided pleural effusion with high concern for malignancy as well as confusion with MRI brain showing right frontal lobe mass with vasogenic edema and microhemorrhaging.  Subjective  Brian French today does not remember talking with me yesterday. Feels more SOB.  Brother at bedside  A & P   1. Acute hypoxic respiratory failure, multifactorial etiology-right-sided malignant pleural effusion, concern for post obstructive pneumonia given neutrophilic  dominant effusion.  Stable O2 saturation on 3 L, was more tachypneic on room air today will obtain chest x-ray to evaluate effusion--Addendum: CXR shows interval worsening, clinically more comfortable breathing after albuterol treatment and stable on 3L, continue to closely monitor, continue empiric vancomycin/cefepime given elevated procalcitonin, check MRSA PCR.  Monitor blood cultures  2. Right brain mass with surrounding vasogenic edema and mild hemorrhage, most concerning for metastatic disease.  MRI brain contrast confirms, also some concern for possible subacute infarct, will need repeat imaging in 2 weeks.  Continue IV Decadron 4 mg every 6 hours, neurochecks.    3. SIRS criteria, resolved.  Met in setting of tachypnea related to malignant pleural effusion and tachycardia related to A. fib with RVR.  Patient is on empiric antibiotics for possible postobstructive pneumonia but I think this is less likely given has remained afebrile without leukocytosis or leukopenia and unremarkable blood cultures.  I believe main driver is likely malignancy, follow procalcitonin trend, low threshold to discontinue antibiotics.  4. Right-sided malignant pleural effusion, worsening.  Status post thoracentesis with 1.2 L on admission, repeat chest x-ray today given tachypnea and increased respiratory effort, plan for Pleurx catheter bronc with biopsy on 11/17 May need repeat thoracentesis prior to this pending chest x-ray imaging   5. Masslike opacity in right perihilar region, increased in size in former smoker with malignant pleural effusion. [facility] due to malignant cells in pleural effusion, high concern for malignancy. Given symptoms above will get EBUS on 11/17 as inpatient  6. AKI on CKD stage IIIb with hyperkalemia and normal anion gap metabolic acidosis,.  Likely prerenal etiology related to ongoing infection/diminished intake.  Peak creatinine of 2.33, currently1.7, baseline  1.2-1.5.  Peak potassium  of 6.1 on admission now back up, EKG ordered lokelma ordered as well, Closely monitor.  On sodium bicarb, avoid nephrotoxins  7. Acute hyponatremia, improving.  Nadir of 126 on admission, currently 132.  Likely hypovolemic hyponatremia, responded to IV fluids, closely monitor  8. Permanent atrial fibrillation, Bradycardia resolved with change from home ER dilt to IR dilt 30 mg q 6 hours, home Eliquis to be on hold while awaiting procedures per PCCM   9. Type 2 diabetes on oral hypoglycemics, hold oral meds while here, monitor CBG, added scheduled novolog with meals given hyperglycemia related to decadron, sliding scale  10. Hyperlipidemia, stable.  Continue home Lipitor  11. BPH, self caths.  UA shows small leukocytes, negative nitrates.  12. 3 cm infrarenal AAA, stable size on CT on 09/07/2018     Family Communication  : We will update wife, brother at bedside  Code Status :  FULL  Disposition Plan  : Close monitoring respiratory status/pleural effusion, continue IV Decadron for symptomatic right brain mass concerning for metastatic disease, monitor electrolytes/kidney function,   Consults  :  PCCm  Procedures  :  Thoracentesis on 11/12. 1.3 L blood fluid removed  DVT Prophylaxis  : SCDs  Lab Results  Component Value Date   PLT 250 05/30/2019    Diet :  Diet Order            Diet NPO time specified  Diet effective now        Diet Carb Modified Fluid consistency: Thin; Room service appropriate? No  Diet effective now               Inpatient Medications Scheduled Meds: . aspirin EC  81 mg Oral Daily  . atorvastatin  80 mg Oral q1800  . dexamethasone (DECADRON) injection  4 mg Intravenous Q6H  . diltiazem  30 mg Oral Q6H  . feeding supplement (ENSURE ENLIVE)  237 mL Oral BID BM  . guaiFENesin  600 mg Oral BID  . insulin aspart  0-5 Units Subcutaneous QHS  . insulin aspart  0-9 Units Subcutaneous TID WC  . insulin aspart  5 Units  Subcutaneous TID WC  . pantoprazole  40 mg Oral Daily  . sodium bicarbonate  650 mg Oral TID  . sodium zirconium cyclosilicate  10 g Oral Once  . tamsulosin  0.4 mg Oral Daily   Continuous Infusions: . sodium chloride 75 mL/hr at 05/29/19 2349  . ceFEPime (MAXIPIME) IV 2 g (05/30/19 0149)  . vancomycin 1,250 mg (05/30/19 0624)   PRN Meds:.acetaminophen, albuterol, HYDROcodone-acetaminophen  Antibiotics  :   Anti-infectives (From admission, onward)   Start     Dose/Rate Route Frequency Ordered Stop   05/30/19 0600  vancomycin (VANCOCIN) 1,250 mg in sodium chloride 0.9 % 250 mL IVPB     1,250 mg 166.7 mL/hr over 90 Minutes Intravenous Every 24 hours 05/29/19 1131     05/29/19 0600  vancomycin (VANCOCIN) 1,250 mg in sodium chloride 0.9 % 250 mL IVPB  Status:  Discontinued     1,250 mg 166.7 mL/hr over 90 Minutes Intravenous Every 36 hours 05/28/19 0924 05/29/19 1131   05/28/19 0200  ceFEPIme (MAXIPIME) 2 g in sodium chloride 0.9 % 100 mL IVPB     2 g 200 mL/hr over 30 Minutes Intravenous Every 12 hours 05/27/19 1314     05/27/19 1300  ceFEPIme (MAXIPIME) 2 g in sodium chloride 0.9 % 100 mL IVPB     2 g 200 mL/hr over 30  Minutes Intravenous  Once 05/27/19 1258 05/27/19 1418   05/27/19 1300  vancomycin (VANCOCIN) 2,000 mg in sodium chloride 0.9 % 500 mL IVPB  Status:  Discontinued     2,000 mg 250 mL/hr over 120 Minutes Intravenous  Once 05/27/19 1258 05/28/19 0924   05/27/19 1230  cefTRIAXone (ROCEPHIN) 1 g in sodium chloride 0.9 % 100 mL IVPB  Status:  Discontinued     1 g 200 mL/hr over 30 Minutes Intravenous  Once 05/27/19 1226 05/27/19 1250   05/27/19 1230  azithromycin (ZITHROMAX) 500 mg in sodium chloride 0.9 % 250 mL IVPB  Status:  Discontinued     500 mg 250 mL/hr over 60 Minutes Intravenous  Once 05/27/19 1226 05/27/19 1250       Objective   Vitals:   05/30/19 0300 05/30/19 0610 05/30/19 0700 05/30/19 1155  BP:  125/74 117/73 135/74  Pulse:      Resp:   (!) 22 (!)  23  Temp:   97.8 F (36.6 C) 98 F (36.7 C)  TempSrc:   Axillary Axillary  SpO2:   100% 97%  Weight: 99 kg     Height:        SpO2: 97 % O2 Flow Rate (L/min): 3 L/min FiO2 (%): (!) 4 %  Wt Readings from Last 3 Encounters:  05/30/19 99 kg  05/08/19 104.3 kg  04/30/19 104.5 kg     Intake/Output Summary (Last 24 hours) at 05/30/2019 1321 Last data filed at 05/30/2019 1021 Gross per 24 hour  Intake 988.95 ml  Output 2575 ml  Net -1586.05 ml    Physical Exam:  Awake, oriented to self, place, not context.  Able to follow all commands and move extremities without notable focal deficits. [place].AT, Decreased breath sounds on right, clear breath sounds on left, tachypneic on 3 L, no accessory muscle use +ve B.Sounds, Abd Soft, No tenderness,  No rebound, guarding or rigidity. No Cyanosis, Clubbing or edema, No new Rash or bruise     I have personally reviewed the following:   Data Reviewed:  CBC Recent Labs  Lab 05/27/19 1200 05/28/19 0345 05/29/19 0428 05/30/19 0446  WBC 34.2* 32.0* 29.6* 25.9*  HGB 13.2 12.3* 11.4* 11.1*  HCT 40.3 35.6* 34.1* 33.8*  PLT 258 213 208 250  MCV 89.2 87.7 87.7 89.2  MCH 29.2 30.3 29.3 29.3  MCHC 32.8 34.6 33.4 32.8  RDW 14.2 14.3 14.5 14.7  LYMPHSABS 1.7  --   --   --   MONOABS 3.8*  --   --   --   EOSABS 4.1*  --   --   --   BASOSABS 0.0  --   --   --     Chemistries  Recent Labs  Lab 05/27/19 1200 05/28/19 0345 05/29/19 0428 05/30/19 0446  NA 126* 129* 131* 132*  K 6.1* 5.1 4.9 5.8*  CL 94* 101 104 104  CO2 17* 16* 15* 13*  GLUCOSE 196* 118* 202* 216*  BUN 50* 51* 46* 41*  CREATININE 2.33* 2.02* 1.71* 1.70*  CALCIUM 8.4* 7.7* 7.8* 8.1*  AST 15  --   --   --   ALT 16  --   --   --   ALKPHOS 99  --   --   --   BILITOT 0.7  --   --   --    ------------------------------------------------------------------------------------------------------------------ No results for input(s): CHOL, HDL, LDLCALC, TRIG, CHOLHDL,  LDLDIRECT in the last 72 hours.  Lab Results  Component Value Date   HGBA1C 9.8 (H) 04/30/2019   ------------------------------------------------------------------------------------------------------------------ No results for input(s): TSH, T4TOTAL, T3FREE, THYROIDAB in the last 72 hours.  Invalid input(s): FREET3 ------------------------------------------------------------------------------------------------------------------ No results for input(s): VITAMINB12, FOLATE, FERRITIN, TIBC, IRON, RETICCTPCT in the last 72 hours.  Coagulation profile No results for input(s): INR, PROTIME in the last 168 hours.  No results for input(s): DDIMER in the last 72 hours.  Cardiac Enzymes No results for input(s): CKMB, TROPONINI, MYOGLOBIN in the last 168 hours.  Invalid input(s): CK ------------------------------------------------------------------------------------------------------------------ No results found for: BNP  Micro Results Recent Results (from the past 240 hour(s))  SARS CORONAVIRUS 2 (TAT 6-24 HRS) Nasopharyngeal Nasopharyngeal Swab     Status: None   Collection Time: 05/27/19 11:35 AM   Specimen: Nasopharyngeal Swab  Result Value Ref Range Status   SARS Coronavirus 2 NEGATIVE NEGATIVE Final    Comment: (NOTE) SARS-CoV-2 target nucleic acids are NOT DETECTED. The SARS-CoV-2 RNA is generally detectable in upper and lower respiratory specimens during the acute phase of infection. Negative results do not preclude SARS-CoV-2 infection, do not rule out co-infections with other pathogens, and should not be used as the sole basis for treatment or other patient management decisions. Negative results must be combined with clinical observations, patient history, and epidemiological information. The expected result is Negative. Fact Sheet for Patients: SugarRoll.be Fact Sheet for Healthcare Providers: https://www.woods-mathews.com/ This  test is not yet approved or cleared by the [place] FDA and  has been authorized for detection and/or diagnosis of SARS-CoV-2 by FDA under an Emergency Use Authorization (EUA). This EUA will remain  in effect (meaning this test can be used) for the duration of the COVID-19 declaration under Section 56 4(b)(1) of the Act, 21 U.S.C. section 360bbb-3(b)(1), unless the authorization is terminated or revoked sooner. Performed at [facility], [place] [address] [place], [place] [address]   Urine culture     Status: Abnormal   Collection Time: 05/27/19 11:37 AM   Specimen: Urine, Random  Result Value Ref Range Status   Specimen Description URINE, RANDOM  Final   Special Requests   Final    NONE Performed at [facility], [place] [address], [place], [place] [address]    Culture >=100,000 COLONIES/mL YEAST (A)  Final   Report Status 05/28/2019 FINAL  Final  Blood culture (routine x 2)     Status: None (Preliminary result)   Collection Time: 05/27/19 12:00 PM   Specimen: BLOOD  Result Value Ref Range Status   Specimen Description BLOOD RIGHT ANTECUBITAL  Final   Special Requests   Final    BOTTLES DRAWN AEROBIC AND ANAEROBIC Blood Culture adequate volume   Culture   Final    NO GROWTH 2 DAYS Performed at [facility], [place] [address], [place], [place] [address]    Report Status PENDING  Incomplete  Blood culture (routine x 2)     Status: None (Preliminary result)   Collection Time: 05/27/19  1:18 PM   Specimen: BLOOD RIGHT HAND  Result Value Ref Range Status   Specimen Description BLOOD RIGHT HAND  Final   Special Requests   Final    BOTTLES DRAWN AEROBIC AND ANAEROBIC Blood Culture results may not be optimal due to an inadequate volume of blood received in culture bottles   Culture   Final    NO GROWTH 2 DAYS Performed at [facility], [place] [address], [place], [place] [address]    Report Status  PENDING  Incomplete  Gram stain     Status: None   Collection  Time: 05/27/19  5:10 PM   Specimen: PATH Cytology Pleural fluid  Result Value Ref Range Status   Specimen Description FLUID PLEURAL  Final   Special Requests NONE  Final   Gram Stain   Final    RARE WBC PRESENT, PREDOMINANTLY PMN NO ORGANISMS SEEN Performed at [facility], [address] [place], [place] [address]    Report Status 05/28/2019 FINAL  Final  Culture, body fluid-bottle     Status: None (Preliminary result)   Collection Time: 05/27/19  5:10 PM   Specimen: Fluid  Result Value Ref Range Status   Specimen Description FLUID PLEURAL  Final   Special Requests BOTTLES DRAWN AEROBIC AND ANAEROBIC  Final   Culture   Final    NO GROWTH 2 DAYS Performed at [facility], [place] [address] [place], [place] [address]    Report Status PENDING  Incomplete    Radiology Reports Ct Chest Wo Contrast  Result Date: 05/28/2019 CLINICAL DATA:  History of metastatic non-small cell lung cancer. EXAM: CT CHEST WITHOUT CONTRAST TECHNIQUE: Multidetector CT imaging of the chest was performed following the standard protocol without IV contrast. COMPARISON:  CT angio chest 04/29/2019 FINDINGS: Cardiovascular: The heart size appears normal. There is a small pericardial effusion. Stable to slightly decreased in volume from previous exam. Aortic atherosclerosis. Left main, lad, left circumflex and RCA coronary artery calcifications Mediastinum/Nodes: Normal appearance of the thyroid gland. The trachea appears patent and is midline. Normal appearance of the esophagus. 1 cm right paratracheal lymph node is identified, 5/3. Unchanged. Enlarged subcarinal lymph node is difficult to measure due to lack of IV contrast material and progressive right pleural effusion extending into the subcarinal region. The best estimate is this measures approximate 2.7 cm, image 79/3. Previously 3 cm. The hilar structures are suboptimally evaluated due to lack of IV contrast material. Lungs/Pleura: Large right pleural  effusion is increased volume when compared with previous exam. There is persistent and progressive atelectasis of the right middle lobe. Presumed central obstructing lesion is difficult to identify to lack of IV contrast material. Rounded density within the right upper lobe appears smaller but progressively more solid measuring 2.5 x 2.0 cm, image 38/4. Previously 2.5 x 3.1 cm. Upper Abdomen: No acute abnormality identified. Enlarged left adrenal gland is partially imaged. Musculoskeletal: No chest wall mass or suspicious bone lesions identified. IMPRESSION: 1. Comparison to previous contrast enhanced CT of the chest from 05/08/2019 is significantly limited largely due to lack of IV contrast material. 2. Interval increase in volume of right pleural effusion. There is progressive atelectasis within the right middle lobe which may be postobstructive. Central right hilar lesion cannot be excluded. 3. Persistent enlarged subcarinal lymph node as noted on previous exam. Comparison with the prior study however is limited due to lack of IV contrast material. 4. Right upper lobe nodular density is smaller but more solid when compared with previous exam. This is nonspecific but may represent an area progressive organizing pneumonia. Electronically Signed   By: Kerby Moors M.D.   On: 05/28/2019 07:22   Brian Brain Wo Contrast  Result Date: 05/29/2019 CLINICAL DATA:  Combined small and non smell also lung cancer. Staging. EXAM: MRI HEAD WITHOUT CONTRAST TECHNIQUE: Multiplanar, multiecho pulse sequences of the brain and surrounding structures were obtained without intravenous contrast. COMPARISON:  MRI head 05/09/2015 FINDINGS: Brain: New lesion in the right frontal  lobe with surrounding edema and mild hemorrhage. This is highly likely to be metastatic disease. Postcontrast imaging recommended to confirm. Multiple small white matter hyperintensities bilaterally are indeterminate. Small lesions in the right parietal  subcortical white matter are suspicious for metastatic disease but contrast enhanced imaging is needed. Generalized atrophy.  Negative for acute infarct. Vascular: Normal arterial flow voids Skull and upper cervical spine: No focal skeletal abnormality. Sinuses/Orbits: Negative Other: Image quality degraded by motion. Limited evaluation for metastatic disease without intravenous contrast IMPRESSION: Hemorrhagic lesion right frontal lobe less than 1 cm with surrounding edema, highly suspicious for metastatic disease. Recommend postcontrast imaging for confirmation and evaluate for other possible lesions. Generalized atrophy.  No acute infarct. Electronically Signed   By: Franchot Gallo M.D.   On: 05/29/2019 11:13   Brian Brain W Contrast  Result Date: 05/29/2019 CLINICAL DATA:  Lung cancer rule out metastatic disease. EXAM: MRI HEAD WITH CONTRAST TECHNIQUE: Multiplanar, multiecho pulse sequences of the brain and surrounding structures were obtained with intravenous contrast. CONTRAST:  61m GADAVIST GADOBUTROL 1 MMOL/ML IV SOLN COMPARISON:  Unenhanced MRI head 05/29/2019 FINDINGS: Right frontal lobe abnormality shows stellate enhancement. This area showed susceptibility suggestive of hemorrhage with surrounding edema. The stellate pattern of enhancement is unusual for metastatic disease. No other enhancing lesions in the brain identified. Ventricle size normal.  No midline shift. IMPRESSION: Right frontal lobe lesion shows stellate enhancement. This pattern is unusual for metastatic disease. Given the susceptibility and surrounding edema, continue to favor metastatic disease however enhancing subacute infarct is a consideration and short-term MRI follow-up without and with contrast is recommended in 2-4 weeks. Electronically Signed   By: CFranchot GalloM.D.   On: 05/29/2019 13:06   Dg Chest Port 1 View  Result Date: 05/28/2019 CLINICAL DATA:  Shortness of breath. EXAM: PORTABLE CHEST 1 VIEW COMPARISON:   05/27/2019.  CT 05/28/2019.  04/29/2019. FINDINGS: Mediastinum unchanged. Stable cardiomegaly. Dense right middle lobe atelectatic changes again noted. Underlying mass lesion again cannot be excluded. Right pleural effusion has increased in size. Reference is made to today's chest CT for further evaluation. No pneumothorax. IMPRESSION: 1. Persistent right middle lobe atelectasis. Underlying mass lesion cannot be excluded. 2. Progressive right pleural effusion. Reference is made to today's chest CT for further evaluation. Electronically Signed   By: TMarcello Moores Register   On: 05/28/2019 12:10   Dg Chest Port 1 View  Result Date: 05/27/2019 CLINICAL DATA:  Pleural effusion. EXAM: PORTABLE CHEST 1 VIEW COMPARISON:  Chest x-ray May 27, 2019 FINDINGS: The right-sided pleural effusion is smaller in the interval after thoracentesis. No pneumothorax. Centralized somewhat masslike opacities seen in the right perihilar region, better assessed on previous CT imaging. A left lower veins clear. The cardiomediastinal silhouette is stable. IMPRESSION: 1. Centralized somewhat masslike opacity in the right perihilar region was better assessed on previous CT imaging and is stable. 2. The right-sided pleural effusion is smaller in the interval after thoracentesis with no pneumothorax. Electronically Signed   By: DDorise BullionIII M.D   On: 05/27/2019 18:51   Dg Chest Portable 1 View  Result Date: 05/27/2019 CLINICAL DATA:  Patient brought in by wife stating patient c/o shortness of breath, decreased appetite, dizziness and pain all over. EXAM: PORTABLE CHEST 1 VIEW COMPARISON:  04/30/2019 and earlier exams. FINDINGS: There is significant increased opacity on the right when compared to the most recent prior chest radiograph. There is at least a moderate pleural effusion. Additional opacity right perihilar and lower lung consistent  with atelectasis, pneumonia or a combination. This has also increased from the prior exam.  Left lung remains clear.  No left pleural effusion. Cardiac silhouette is normal in size. No mediastinal or left hilar mass. Right hilum partly obscured. Skeletal structures are grossly intact. IMPRESSION: 1. Worsening aeration on the right compared to the most recent prior study. Previously noted right pleural effusion has enlarged. There is increased opacity in the right perihilar and right lung base is consistent with pneumonia, atelectasis or a combination, with atelectasis favored. Electronically Signed   By: Lajean Manes M.D.   On: 05/27/2019 12:07   Ct Angio Abd/pel W And/or Wo Contrast  Result Date: 05/08/2019 CLINICAL DATA:  Question ischemia or renal infarct, recent diagnosis of atrial fibrillation, recent abnormal CT with persistent right abdominal pain. EXAM: CTA ABDOMEN AND PELVIS WITHOUT AND WITH CONTRAST TECHNIQUE: Multidetector CT imaging of the abdomen and pelvis was performed using the standard protocol during bolus administration of intravenous contrast. Multiplanar reconstructed images and MIPs were obtained and reviewed to evaluate the vascular anatomy. CONTRAST:  21m OMNIPAQUE IOHEXOL 350 MG/ML SOLN COMPARISON:  CT 04/29/2019 FINDINGS: VASCULAR Aorta: Mixture of calcified noncalcified atherosclerotic plaque throughout the abdominal aorta. There is fusiform abdominal aortic aneurysm measuring up to 3 cm in maximal diameter. No periaortic stranding. No occlusion or stenosis. Celiac: Ostial plaque results in mild narrowing. Minimal plaque within the proximal vessel without significant stenosis. No evidence of aneurysm, dissection or vasculitis. SMA: Ostial plaque results and mild narrowing. Minimal atheromatous plaque in the proximal vessel. No significant stenosis. No evidence of aneurysm, dissection or vasculitis. Renals: Single renal arteries bilaterally. Plaque results in at least moderate narrowing of the proximal right renal artery. Normal distal opacification. Ostial plaque results  and mild narrowing of the left renal artery as well. IMA: IMA arises from the fusiform abdominal aortic aneurysm. No evidence of aneurysm, dissection or vasculitis. Inflow: No extension of the aneurysm into the proximal inflow. Calcified noncalcified plaque seen throughout the inflow vessels. No aneurysm, dissection, vasculitis or significant stenosis. Proximal Outflow: Plaque noted in the common femoral arteries and at the level of the bifurcations without significant stenosis, aneurysm or dissection Veins: Venous phase imaging reveals no significant venous abnormality. Portal and hepatic veins are patent. Review of the MIP images confirms the above findings. NON-VASCULAR Lower chest: Redemonstration of a moderate right pleural effusion with adjacent areas of passive atelectasis in the right lung base. No abnormal pleural thickening or enhancement. Small volume pericardial effusion. Cardiac size is within normal limits. Hepatobiliary: No focal liver abnormality is seen. No gallstones, gallbladder wall thickening, or biliary dilatation. Pancreas: Fatty replacement of the pancreas. No pancreatic ductal dilatation or surrounding inflammatory changes. Spleen: Normal in size without focal abnormality. Adrenals/Urinary Tract: Stable appearance of the 1.6 cm left adrenal nodule. Right adrenal gland is unremarkable. Exophytic left renal cysts are similar to prior exam. There is moderate bilateral perinephric stranding and low-attenuation striations of the bilateral renal parenchyma. Ill-defined rounded hypoattenuating lesions involving both kidneys are suspicious for lobar nephronia or developing abscess, less likely infarct in the setting of additional inflammation and infectious features. Right extrarenal pelvis. Urothelial thickening of both collecting systems. Circumferential thickening of the urinary bladder with bladder wall trabeculations. Stomach/Bowel: Distal esophagus, stomach and duodenal sweep are unremarkable.  No small bowel wall thickening or dilatation. No evidence of obstruction. A normal appendix is visualized. No colonic dilatation or wall thickening. Lymphatic: No suspicious or enlarged lymph nodes in the included lymphatic chains. Reproductive: Borderline  prostatomegaly. Other: No abdominopelvic free fluid or free gas. No bowel containing hernias. Small fat containing inguinal hernias. Musculoskeletal: Multilevel degenerative changes are present in the imaged portions of the spine. Findings most pronounced at L3-4 with a calcified disc bulge resulting in moderate canal stenosis. Features likely exacerbated by what appear to be congenitally short pedicles. No acute osseous abnormality or suspicious osseous lesion. IMPRESSION: VASCULAR 1. Aortic Atherosclerosis (ICD10-I70.0). 2. Ostial plaque results in mild narrowing of the celiac, SMA, and left renal artery origins. 3. Moderate segmental narrowing of the proximal right renal artery secondary to atheromatous plaque. 4. Infrarenal abdominal aortic aneurysm measuring up 3 cm. Recommend followup by ultrasound in 3 years. This recommendation follows ACR consensus guidelines: White Paper of the ACR Incidental Findings Committee II on Vascular Findings. J Am Coll Radiol 2013; 10:789-794. Aortic aneurysm NOS (ICD10-I71.9) NON-VASCULAR 1. Bilateral perinephric and periureteral stranding with urothelial thickening, nephrographic striations and ill-defined rounded hypoattenuating lesions involving both kidneys, suspicious for lobar nephronia or developing abscess, less likely infarct given the additional features supporting an ascending urinary tract infection. 2. Circumferential thickening of the urinary bladder with bladder wall trabeculations, consistent with chronic outlet obstruction or neurogenic bladder. 3. Moderate right pleural effusion with adjacent areas of passive atelectasis in the right lung base. 4. Small volume pericardial effusion. These results were called  by telephone at the time of interpretation on 05/08/2019 at 7:23 pm to provider [facility] , who verbally acknowledged these results. Electronically Signed   By: Lovena Le M.D.   On: 05/08/2019 19:24     Time Spent in minutes  30     Desiree Hane M.D on 05/30/2019 at 1:21 PM  To page go to [email or web address] - password [facility]

## 2019-05-30 NOTE — Progress Notes (Signed)
C. Bodenheimer on call paged re: pt not redirectable, still disoriented x4, now paranoid, agitated, pulling lines off, wheezing-prn neb given- requesting sitter if possible. Fall mats and bed alarm on.

## 2019-05-30 NOTE — Progress Notes (Signed)
Pt disoriented & not able to be reoriented. Believes himself to be locked up in house w/ 6 people; resting calmly at the moment, but fall mats were placed on floor.

## 2019-05-30 NOTE — Progress Notes (Signed)
Pt unable to describe his anxiety; the more agitated he becomes, the worse wheezing gets. See new orders from on-call. Continues to pull at wires, tubes,lines.

## 2019-05-31 ENCOUNTER — Encounter (HOSPITAL_COMMUNITY): Payer: Self-pay | Admitting: Physician Assistant

## 2019-05-31 ENCOUNTER — Inpatient Hospital Stay (HOSPITAL_COMMUNITY): Payer: Medicare Other

## 2019-05-31 ENCOUNTER — Encounter (HOSPITAL_COMMUNITY): Payer: Self-pay | Admitting: Hematology and Oncology

## 2019-05-31 DIAGNOSIS — J910 Malignant pleural effusion: Secondary | ICD-10-CM

## 2019-05-31 LAB — CBC
HCT: 34.2 % — ABNORMAL LOW (ref 39.0–52.0)
Hemoglobin: 11.3 g/dL — ABNORMAL LOW (ref 13.0–17.0)
MCH: 29.7 pg (ref 26.0–34.0)
MCHC: 33 g/dL (ref 30.0–36.0)
MCV: 89.8 fL (ref 80.0–100.0)
Platelets: 321 10*3/uL (ref 150–400)
RBC: 3.81 MIL/uL — ABNORMAL LOW (ref 4.22–5.81)
RDW: 15 % (ref 11.5–15.5)
WBC: 34.8 10*3/uL — ABNORMAL HIGH (ref 4.0–10.5)
nRBC: 0 % (ref 0.0–0.2)

## 2019-05-31 LAB — POTASSIUM
Potassium: 6.1 mmol/L — ABNORMAL HIGH (ref 3.5–5.1)
Potassium: 5.7 mmol/L — ABNORMAL HIGH (ref 3.5–5.1)
Potassium: 5.8 mmol/L — ABNORMAL HIGH (ref 3.5–5.1)

## 2019-05-31 LAB — GLUCOSE, CAPILLARY
Glucose-Capillary: 297 mg/dL — ABNORMAL HIGH (ref 70–99)
Glucose-Capillary: 422 mg/dL — ABNORMAL HIGH (ref 70–99)
Glucose-Capillary: 413 mg/dL — ABNORMAL HIGH (ref 70–99)
Glucose-Capillary: 335 mg/dL — ABNORMAL HIGH (ref 70–99)

## 2019-05-31 LAB — BASIC METABOLIC PANEL
Anion gap: 15 (ref 5–15)
BUN: 49 mg/dL — ABNORMAL HIGH (ref 8–23)
CO2: 14 mmol/L — ABNORMAL LOW (ref 22–32)
Calcium: 8.3 mg/dL — ABNORMAL LOW (ref 8.9–10.3)
Chloride: 101 mmol/L (ref 98–111)
Creatinine, Ser: 1.8 mg/dL — ABNORMAL HIGH (ref 0.61–1.24)
GFR calc Af Amer: 41 mL/min — ABNORMAL LOW (ref >60)
GFR calc non Af Amer: 36 mL/min — ABNORMAL LOW (ref >60)
Glucose, Bld: 385 mg/dL — ABNORMAL HIGH (ref 70–99)
Potassium: 5.6 mmol/L — ABNORMAL HIGH (ref 3.5–5.1)
Sodium: 130 mmol/L — ABNORMAL LOW (ref 135–145)

## 2019-05-31 LAB — PATHOLOGIST SMEAR REVIEW

## 2019-05-31 LAB — OSMOLALITY: Osmolality: 305 mOsm/kg — ABNORMAL HIGH (ref 275–295)

## 2019-05-31 LAB — PROCALCITONIN: Procalcitonin: 0.38 ng/mL

## 2019-05-31 LAB — OSMOLALITY, URINE: Osmolality, Ur: 481 mOsm/kg (ref 300–900)

## 2019-05-31 LAB — CORTISOL: Cortisol, Plasma: 2.2 ug/dL

## 2019-05-31 MED ORDER — INSULIN GLARGINE 100 UNIT/ML ~~LOC~~ SOLN
10.0000 [IU] | Freq: Two times a day (BID) | SUBCUTANEOUS | Status: DC
  Administered 2019-05-31 – 2019-06-02 (×5): 10 [IU] via SUBCUTANEOUS
  Filled 2019-05-31 (×6): qty 0.1

## 2019-05-31 MED ORDER — SODIUM ZIRCONIUM CYCLOSILICATE 10 G PO PACK
10.0000 g | PACK | Freq: Once | ORAL | Status: AC
  Administered 2019-05-31: 10 g via ORAL
  Filled 2019-05-31: qty 1

## 2019-05-31 MED ORDER — SODIUM BICARBONATE 650 MG PO TABS
1300.0000 mg | ORAL_TABLET | Freq: Two times a day (BID) | ORAL | Status: DC
  Administered 2019-05-31 – 2019-06-04 (×8): 1300 mg via ORAL
  Filled 2019-05-31 (×8): qty 2

## 2019-05-31 MED ORDER — DEXAMETHASONE SODIUM PHOSPHATE 4 MG/ML IJ SOLN
4.0000 mg | INTRAMUSCULAR | Status: DC
  Administered 2019-06-01 – 2019-06-04 (×4): 4 mg via INTRAVENOUS
  Filled 2019-05-31 (×5): qty 1

## 2019-05-31 MED ORDER — INSULIN ASPART 100 UNIT/ML ~~LOC~~ SOLN
0.0000 [IU] | Freq: Every day | SUBCUTANEOUS | Status: DC
  Administered 2019-05-31: 4 [IU] via SUBCUTANEOUS
  Administered 2019-06-03: 3 [IU] via SUBCUTANEOUS

## 2019-05-31 MED ORDER — INSULIN ASPART 100 UNIT/ML ~~LOC~~ SOLN
0.0000 [IU] | Freq: Three times a day (TID) | SUBCUTANEOUS | Status: DC
  Administered 2019-05-31: 12 [IU] via SUBCUTANEOUS
  Administered 2019-05-31: 14 [IU] via SUBCUTANEOUS
  Administered 2019-06-01: 5 [IU] via SUBCUTANEOUS
  Administered 2019-06-01 – 2019-06-02 (×2): 8 [IU] via SUBCUTANEOUS
  Administered 2019-06-02: 3 [IU] via SUBCUTANEOUS
  Administered 2019-06-02: 15 [IU] via SUBCUTANEOUS
  Administered 2019-06-03: 5 [IU] via SUBCUTANEOUS
  Administered 2019-06-03: 2 [IU] via SUBCUTANEOUS
  Administered 2019-06-04: 5 [IU] via SUBCUTANEOUS
  Administered 2019-06-04: 07:00:00 2 [IU] via SUBCUTANEOUS

## 2019-05-31 NOTE — Progress Notes (Signed)
NAME:  Brian French., MRN:  [id number], DOB:  [birth year], LOS: 4 ADMISSION DATE:  05/27/2019, CONSULTATION DATE:  11/13 REFERRING MD:  Dr. Teryl Lucy, CHIEF COMPLAINT:  SOB, cough   Brief History   78 y/o M admitted 11/12 with worsening dyspnea and cough.   History of present illness   78 y/o M who [facility] to [facility] on [facility]/12 with reports of decreased appetite, dizziness, abdominal pain and shortness of breath.    He was admitted to the hospital 10/15-10/19 for acute pyelonephritis & suspected right sided PNA.   During that admission, he underwent a thoracentesis per IR (10/16) with malignant cells in pleural fluid. He is followed by the [facility] and is currently under evaluation by [facility] for possible cancer (unknown primary / family could not state) but no definitive diagnosis has been made.  His wife indicates he has been having trouble with his memory for approximately 6 months and was started on Namenda.  She states he has been "saying things that are off".  She notes approximately 20lbs in the last few months.   ER evaluation found him to be in AF RVR, tachypneic requiring O2.  CXR showed concerning for enlarged right pleural effusion.  Initial labs notable for hyponatremia (126), hyperkalemia (6.1), CO2 17, BUN 50 / Sr Cr 2.33 (up from 1.85 10/24), albumin 2.5, WBC 34.2 and platelets 258.   Subsequent CT imaging of the chest demonstrated an increase in the right pleural effusion with compressive atelectasis, persistent enlarged subcarinal lymph nodes, RUL nodular density.   PCCM consulted for evaluation of malignant effusion.   Past Medical History  DM  HLD - lipitor  HTN AF - on eliquis, ASA, cardizem Prostate Cancer - self caths  Former [facility]   11/12 Admit   Consults:  PCCM   Procedures:    Significant Diagnostic Tests:  ECHO 10/16 >> LVEF 50-55%, mild LVH, impaired diastolic filling, global RV systolic function normal, LA/RA size normal, trivial  pericardial effusion, trace MV regurgitation  11/14 MRI brain Hemorrhagic lesion right frontal lobe less than 1 cm with surrounding edema, highly suspicious for metastatic disease. Micro Data:  COVID 11/12 >> negative  Pleural Fluid GS 11/12 >. Negative  UC 11/12 >> 100k yeast  BCx2 11/12 >> NGTD Pleural Fluid Culture 11/12 >> NGTD Pleural fluid path pending   Antimicrobials:  Cefepime 11/12 >>  Vanco 11/12 >>   Interim history/subjective:  More SOB today.  Steroids making him loopy and sugars are out of control.  Objective   Blood pressure (!) 172/94, pulse (!) 104, temperature (!) 97.3 F (36.3 C), temperature source Axillary, resp. rate 20, height 5' 11"  (1.803 m), weight 99.2 kg, SpO2 96 %.        Intake/Output Summary (Last 24 hours) at 05/31/2019 1247 Last data filed at 05/31/2019 1158 Gross per 24 hour  Intake 1297 ml  Output 1300 ml  Net -3 ml   Filed Weights   05/29/19 0358 05/30/19 0300 05/31/19 0424  Weight: 98.3 kg 99 kg 99.2 kg    Examination: General: Elderly, well-developed, well nourished. NAD HENT: Normocephalic, PERRL.  Neck: No JVD. Trachea midline.  CV: RRR. S1S2. No MRG. +2 distal pulses Lungs: Absent breath sounds thoughout most of left side, large effusion on [place], tachypneic ABD: +BS x4. SNT/ND. No masses, guarding or rigidity GU: No Foley EXT: MAE well. No edema Skin: PWD. In tact. No rashes or lesions Neuro: A&Ox3. CN II-XII in tact. 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  Patient consented at bedside for this today. -eliquis discontinued 11/13  in anticipation of above procedures  -pulmonary hygiene   -IS, mobilize -Wean O2 as needed to support sats greater than or equal to 90% -decrease decadron dosing given sugars, Bps, and worsening agitation with this.  Met is pretty small. -risks of AC probably outweigh benefits for forseeable future - will drain a bit from left lung given resp distress today, will leave enough for pleurX placement tomorrow afternoon   Erskine Emery MD PCCM

## 2019-05-31 NOTE — Progress Notes (Signed)
Pt now calm, resting, no complaints at this time. Says he "feels much better".

## 2019-05-31 NOTE — Progress Notes (Signed)
[facility]  PROGRESS NOTE  Zadkiel Dragan. DUK:[id number] DOB: 09/18/1940 DOA: 05/27/2019 PCP: [facility], Dr. Domenica Fail Admit date - 05/27/2019   Admitting Physician Barb Merino, MD  Outpatient Primary MD for the patient is [facility]  LOS - 4 Brief Narrative  Patient is a 78 year old male with medical history significant for type 2 diabetes, HLD, HTN, A. fib on Eliquis, recent diagnosis of malignant pleural effusion and recent hospitalization (10/15-10/19) for right-sided pneumonia complicated by parapneumonic malignant effusion.  Patient presented with worsening dyspnea on exertion and mild nonproductive cough.  Chest x-ray was concerning for masslike opacity at the right perihilar area with enlarged right pleural effusion.  Patient was admitted for further assessment and management.  Patient has been on broad-spectrum antibiotics.  Patient underwent thoracentesis by IR on 11/12 with 1.3 L of bloody fluid removed.  There are concerns for possible malignancy, probably, lung cancer.  Pulmonary team has been consulted.  Pulmonary team plans bronchoscopy with biopsy and likely Pleurx catheter placement tomorrow, 06/01/2019.  MRI brain revealed right frontal lobe mass with vasogenic edema.  Patient is on IV dexamethasone.   Subjective  Patient is diabetic give any significant history.  A & P   Acute hypoxic respiratory failure: -Likely multifactorial. -See above documentation. -Pulmonary team has been consulted. -Bronchoscopy is planned for tomorrow. -Further management will depend on hospital course.  Right brain (frontal lobe) mass: -Likely metastatic -Continue IV dexamethasone -Further management will depend on hospital course.    SIRS: Resolved.    Right-sided malignant pleural effusion: -Prior thoracentesis noted, 1.2 L tachypnea -For bronchoscopy tomorrow and likely Pleurx catheter placement.  Masslike opacity in right perihilar  region: -Concerns for primary lung cancer -Bronchoscopy and possible biopsy tomorrow -Further management depending on above. -Guarded prognosis if it is lung cancer.  AKI on CKD stage IIIb: -Baseline CKD 3B  -Current worsening likely multifactorial  -We will get a renal ultrasound  -Urine protein and creatinine  -Extent of work-up will depend on goal of care  -AKI has peaked.   -Avoid nephrotoxins  -Dose all medications assuming GFR of less than 15 mils per minute  -Further management depend on hospital course   Normal anion gap metabolic acidosis with hyperkalemia: -This could be RTA type IV -Continue to monitor closely -Manage hyperkalemia -Optimize blood sugar control -Bicarb to correct acidosis, rather cautiously.    Acute hyponatremia: -Likely SIADH  -Could be paraneoplastic syndrome. -Urine osmolality and serum osmolality noted -Presence of hyperkalemia may be indicative of possible hypoaldosteronism.  Cortisol is 2.2,, but patient is on dexamethasone.  Permanent atrial fibrillation: -Optimize heart rate. -Anticoagulation is on hold.  Type 2 diabetes: -Uncontrolled -Start subcutaneous Lantus 10 units twice daily -Change sliding scale insulin coverage to moderate scale -Hold 5 units of insulin premeal.  -Continue to optimize.   Hyperlipidemia: -Continue Lipitor.  BPH: -Self caths.  -Low threshold to consider Foley catheterization as patient's memory can not be trusted.    Yeast in urine:  -Likely related to catheterization.   -Urine culture resulted on 05/27/2019  3 cm infrarenal AAA: -Monitor as per protocol   Family Communication    Code Status :  FULL  Disposition Plan  : This will depend on hospital course  Consults  :  PCCm  Procedures  :  Thoracentesis on 11/12. 1.3 L blood fluid removed  DVT Prophylaxis  : SCDs  Lab Results  Component Value Date   PLT 321 05/31/2019    Diet :  Diet  Order            Diet NPO time specified  Diet  effective now        Diet Carb Modified Fluid consistency: Thin; Room service appropriate? No  Diet effective now               Inpatient Medications Scheduled Meds:  aspirin EC  81 mg Oral Daily   atorvastatin  80 mg Oral q1800   dexamethasone (DECADRON) injection  4 mg Intravenous Q6H   diltiazem  30 mg Oral Q6H   feeding supplement (ENSURE ENLIVE)  237 mL Oral BID BM   guaiFENesin  600 mg Oral BID   insulin aspart  0-5 Units Subcutaneous QHS   insulin aspart  0-9 Units Subcutaneous TID WC   insulin aspart  5 Units Subcutaneous TID WC   pantoprazole  40 mg Oral Daily   sodium bicarbonate  650 mg Oral TID   tamsulosin  0.4 mg Oral Daily   Continuous Infusions:  ceFEPime (MAXIPIME) IV 2 g (05/31/19 0111)   vancomycin 1,250 mg (05/31/19 0609)   PRN Meds:.acetaminophen, albuterol, HYDROcodone-acetaminophen, ipratropium-albuterol  Antibiotics  :   Anti-infectives (From admission, onward)   Start     Dose/Rate Route Frequency Ordered Stop   05/30/19 0600  vancomycin (VANCOCIN) 1,250 mg in sodium chloride 0.9 % 250 mL IVPB     1,250 mg 166.7 mL/hr over 90 Minutes Intravenous Every 24 hours 05/29/19 1131     05/29/19 0600  vancomycin (VANCOCIN) 1,250 mg in sodium chloride 0.9 % 250 mL IVPB  Status:  Discontinued     1,250 mg 166.7 mL/hr over 90 Minutes Intravenous Every 36 hours 05/28/19 0924 05/29/19 1131   05/28/19 0200  ceFEPIme (MAXIPIME) 2 g in sodium chloride 0.9 % 100 mL IVPB     2 g 200 mL/hr over 30 Minutes Intravenous Every 12 hours 05/27/19 1314     05/27/19 1300  ceFEPIme (MAXIPIME) 2 g in sodium chloride 0.9 % 100 mL IVPB     2 g 200 mL/hr over 30 Minutes Intravenous  Once 05/27/19 1258 05/27/19 1418   05/27/19 1300  vancomycin (VANCOCIN) 2,000 mg in sodium chloride 0.9 % 500 mL IVPB  Status:  Discontinued     2,000 mg 250 mL/hr over 120 Minutes Intravenous  Once 05/27/19 1258 05/28/19 0924   05/27/19 1230  cefTRIAXone (ROCEPHIN) 1 g in sodium  chloride 0.9 % 100 mL IVPB  Status:  Discontinued     1 g 200 mL/hr over 30 Minutes Intravenous  Once 05/27/19 1226 05/27/19 1250   05/27/19 1230  azithromycin (ZITHROMAX) 500 mg in sodium chloride 0.9 % 250 mL IVPB  Status:  Discontinued     500 mg 250 mL/hr over 60 Minutes Intravenous  Once 05/27/19 1226 05/27/19 1250       Objective   Vitals:   05/31/19 0435 05/31/19 0520 05/31/19 0600 05/31/19 0700  BP: 116/73 (!) 149/65 130/64 127/88  Pulse:      Resp: 17 (!) 23 17 20   Temp:    97.6 F (36.4 C)  TempSrc:    Axillary  SpO2: 95% 98% 97% 97%  Weight:      Height:        SpO2: 97 % O2 Flow Rate (L/min): 0 L/min FiO2 (%): (!) 4 %  Wt Readings from Last 3 Encounters:  05/31/19 99.2 kg  05/08/19 104.3 kg  04/30/19 104.5 kg     Intake/Output Summary (Last 24 hours) at  05/31/2019 0815 Last data filed at 05/31/2019 4034 Gross per 24 hour  Intake 1279 ml  Output 1450 ml  Net -171 ml    Physical Exam: General condition: Patient is calm.  Patient is not in any distress. HEENT: Mild pallor.  No jaundice Neck: Supple.  No JVD Lungs: Decreased air entry right lung field. CVS: S1-S2. Abdomen: Obese, soft and nontender.  Grams are not palpable. Neuro: Awake and alert.  Patient moves all extremities.  I have personally reviewed the following:   Data Reviewed:  CBC Recent Labs  Lab 05/27/19 1200 05/28/19 0345 05/29/19 0428 05/30/19 0446 05/31/19 0038  WBC 34.2* 32.0* 29.6* 25.9* 34.8*  HGB 13.2 12.3* 11.4* 11.1* 11.3*  HCT 40.3 35.6* 34.1* 33.8* 34.2*  PLT 258 213 208 250 321  MCV 89.2 87.7 87.7 89.2 89.8  MCH 29.2 30.3 29.3 29.3 29.7  MCHC 32.8 34.6 33.4 32.8 33.0  RDW 14.2 14.3 14.5 14.7 15.0  LYMPHSABS 1.7  --   --   --   --   MONOABS 3.8*  --   --   --   --   EOSABS 4.1*  --   --   --   --   BASOSABS 0.0  --   --   --   --     Chemistries  Recent Labs  Lab 05/27/19 1200 05/28/19 0345 05/29/19 0428 05/30/19 0446 05/30/19 1347 05/30/19 1605  05/30/19 2034 05/31/19 0038  NA 126* 129* 131* 132* 131*  --   --  130*  K 6.1* 5.1 4.9 5.8* 5.7* 5.8* 5.7* 5.6*  CL 94* 101 104 104 104  --   --  101  CO2 17* 16* 15* 13* 13*  --   --  14*  GLUCOSE 196* 118* 202* 216* 316*  --   --  385*  BUN 50* 51* 46* 41* 46*  --   --  49*  CREATININE 2.33* 2.02* 1.71* 1.70* 1.61*  --   --  1.80*  CALCIUM 8.4* 7.7* 7.8* 8.1* 8.1*  --   --  8.3*  AST 15  --   --   --   --   --   --   --   ALT 16  --   --   --   --   --   --   --   ALKPHOS 99  --   --   --   --   --   --   --   BILITOT 0.7  --   --   --   --   --   --   --    ------------------------------------------------------------------------------------------------------------------ No results for input(s): CHOL, HDL, LDLCALC, TRIG, CHOLHDL, LDLDIRECT in the last 72 hours.  Lab Results  Component Value Date   HGBA1C 9.8 (H) 04/30/2019   ------------------------------------------------------------------------------------------------------------------ No results for input(s): TSH, T4TOTAL, T3FREE, THYROIDAB in the last 72 hours.  Invalid input(s): FREET3 ------------------------------------------------------------------------------------------------------------------ No results for input(s): VITAMINB12, FOLATE, FERRITIN, TIBC, IRON, RETICCTPCT in the last 72 hours.  Coagulation profile No results for input(s): INR, PROTIME in the last 168 hours.  No results for input(s): DDIMER in the last 72 hours.  Cardiac Enzymes No results for input(s): CKMB, TROPONINI, MYOGLOBIN in the last 168 hours.  Invalid input(s): CK ------------------------------------------------------------------------------------------------------------------ No results found for: BNP  Micro Results Recent Results (from the past 240 hour(s))  SARS CORONAVIRUS 2 (TAT 6-24 HRS) Nasopharyngeal Nasopharyngeal Swab     Status: None   Collection Time:  05/27/19 11:35 AM   Specimen: Nasopharyngeal Swab  Result Value Ref  Range Status   SARS Coronavirus 2 NEGATIVE NEGATIVE Final    Comment: (NOTE) SARS-CoV-2 target nucleic acids are NOT DETECTED. The SARS-CoV-2 RNA is generally detectable in upper and lower respiratory specimens during the acute phase of infection. Negative results do not preclude SARS-CoV-2 infection, do not rule out co-infections with other pathogens, and should not be used as the sole basis for treatment or other patient management decisions. Negative results must be combined with clinical observations, patient history, and epidemiological information. The expected result is Negative. Fact Sheet for Patients: SugarRoll.be Fact Sheet for Healthcare Providers: https://www.woods-mathews.com/ This test is not yet approved or cleared by the [place] FDA and  has been authorized for detection and/or diagnosis of SARS-CoV-2 by FDA under an Emergency Use Authorization (EUA). This EUA will remain  in effect (meaning this test can be used) for the duration of the COVID-19 declaration under Section 56 4(b)(1) of the Act, 21 U.S.C. section 360bbb-3(b)(1), unless the authorization is terminated or revoked sooner. Performed at [facility], [place] [address], [place], [place] [address]   Urine culture     Status: Abnormal   Collection Time: 05/27/19 11:37 AM   Specimen: Urine, Random  Result Value Ref Range Status   Specimen Description URINE, RANDOM  Final   Special Requests   Final    NONE Performed at [facility], [place] [address] [place], [place] [address]    Culture >=100,000 COLONIES/mL YEAST (A)  Final   Report Status 05/28/2019 FINAL  Final  Blood culture (routine x 2)     Status: None (Preliminary result)   Collection Time: 05/27/19 12:00 PM   Specimen: BLOOD  Result Value Ref Range Status   Specimen Description BLOOD RIGHT ANTECUBITAL  Final   Special Requests   Final    BOTTLES DRAWN AEROBIC AND ANAEROBIC Blood Culture  adequate volume   Culture   Final    NO GROWTH 3 DAYS Performed at [facility], [place] [address] [place], [place] [address]    Report Status PENDING  Incomplete  Blood culture (routine x 2)     Status: None (Preliminary result)   Collection Time: 05/27/19  1:18 PM   Specimen: BLOOD RIGHT HAND  Result Value Ref Range Status   Specimen Description BLOOD RIGHT HAND  Final   Special Requests   Final    BOTTLES DRAWN AEROBIC AND ANAEROBIC Blood Culture results may not be optimal due to an inadequate volume of blood received in culture bottles   Culture   Final    NO GROWTH 3 DAYS Performed at [facility], [place] [address], [place], [place] [address]    Report Status PENDING  Incomplete  Gram stain     Status: None   Collection Time: 05/27/19  5:10 PM   Specimen: PATH Cytology Pleural fluid  Result Value Ref Range Status   Specimen Description FLUID PLEURAL  Final   Special Requests NONE  Final   Gram Stain   Final    RARE WBC PRESENT, PREDOMINANTLY PMN NO ORGANISMS SEEN Performed at [facility], [place] [address] [place], [place] [address]    Report Status 05/28/2019 FINAL  Final  Culture, body fluid-bottle     Status: None (Preliminary result)   Collection Time: 05/27/19  5:10 PM   Specimen: Fluid  Result Value Ref Range Status   Specimen Description FLUID PLEURAL  Final   Special Requests BOTTLES DRAWN AEROBIC AND ANAEROBIC  Final   Culture   Final    NO GROWTH 3 DAYS Performed at [facility], [place] [address], [place], [place] [address]    Report Status PENDING  Incomplete    Radiology Reports Ct Chest Wo Contrast  Result Date: 05/28/2019 CLINICAL DATA:  History of metastatic non-small cell lung cancer. EXAM: CT CHEST WITHOUT CONTRAST TECHNIQUE: Multidetector CT imaging of the chest was performed following the standard protocol without IV contrast. COMPARISON:  CT angio chest 04/29/2019 FINDINGS: Cardiovascular: The heart size appears normal. There is  a small pericardial effusion. Stable to slightly decreased in volume from previous exam. Aortic atherosclerosis. Left main, lad, left circumflex and RCA coronary artery calcifications Mediastinum/Nodes: Normal appearance of the thyroid gland. The trachea appears patent and is midline. Normal appearance of the esophagus. 1 cm right paratracheal lymph node is identified, 5/3. Unchanged. Enlarged subcarinal lymph node is difficult to measure due to lack of IV contrast material and progressive right pleural effusion extending into the subcarinal region. The best estimate is this measures approximate 2.7 cm, image 79/3. Previously 3 cm. The hilar structures are suboptimally evaluated due to lack of IV contrast material. Lungs/Pleura: Large right pleural effusion is increased volume when compared with previous exam. There is persistent and progressive atelectasis of the right middle lobe. Presumed central obstructing lesion is difficult to identify to lack of IV contrast material. Rounded density within the right upper lobe appears smaller but progressively more solid measuring 2.5 x 2.0 cm, image 38/4. Previously 2.5 x 3.1 cm. Upper Abdomen: No acute abnormality identified. Enlarged left adrenal gland is partially imaged. Musculoskeletal: No chest wall mass or suspicious bone lesions identified. IMPRESSION: 1. Comparison to previous contrast enhanced CT of the chest from 05/08/2019 is significantly limited largely due to lack of IV contrast material. 2. Interval increase in volume of right pleural effusion. There is progressive atelectasis within the right middle lobe which may be postobstructive. Central right hilar lesion cannot be excluded. 3. Persistent enlarged subcarinal lymph node as noted on previous exam. Comparison with the prior study however is limited due to lack of IV contrast material. 4. Right upper lobe nodular density is smaller but more solid when compared with previous exam. This is nonspecific but  may represent an area progressive organizing pneumonia. Electronically Signed   By: Kerby Moors M.D.   On: 05/28/2019 07:22   Mr Brain Wo Contrast  Result Date: 05/29/2019 CLINICAL DATA:  Combined small and non smell also lung cancer. Staging. EXAM: MRI HEAD WITHOUT CONTRAST TECHNIQUE: Multiplanar, multiecho pulse sequences of the brain and surrounding structures were obtained without intravenous contrast. COMPARISON:  MRI head 05/09/2015 FINDINGS: Brain: New lesion in the right frontal lobe with surrounding edema and mild hemorrhage. This is highly likely to be metastatic disease. Postcontrast imaging recommended to confirm. Multiple small white matter hyperintensities bilaterally are indeterminate. Small lesions in the right parietal subcortical white matter are suspicious for metastatic disease but contrast enhanced imaging is needed. Generalized atrophy.  Negative for acute infarct. Vascular: Normal arterial flow voids Skull and upper cervical spine: No focal skeletal abnormality. Sinuses/Orbits: Negative Other: Image quality degraded by motion. Limited evaluation for metastatic disease without intravenous contrast IMPRESSION: Hemorrhagic lesion right frontal lobe less than 1 cm with surrounding edema, highly suspicious for metastatic disease. Recommend postcontrast imaging for confirmation and evaluate for other possible lesions. Generalized atrophy.  No acute infarct. Electronically Signed   By:  Franchot Gallo M.D.   On: 05/29/2019 11:13   Mr Brain W Contrast  Result Date: 05/29/2019 CLINICAL DATA:  Lung cancer rule out metastatic disease. EXAM: MRI HEAD WITH CONTRAST TECHNIQUE: Multiplanar, multiecho pulse sequences of the brain and surrounding structures were obtained with intravenous contrast. CONTRAST:  56mL GADAVIST GADOBUTROL 1 MMOL/ML IV SOLN COMPARISON:  Unenhanced MRI head 05/29/2019 FINDINGS: Right frontal lobe abnormality shows stellate enhancement. This area showed susceptibility  suggestive of hemorrhage with surrounding edema. The stellate pattern of enhancement is unusual for metastatic disease. No other enhancing lesions in the brain identified. Ventricle size normal.  No midline shift. IMPRESSION: Right frontal lobe lesion shows stellate enhancement. This pattern is unusual for metastatic disease. Given the susceptibility and surrounding edema, continue to favor metastatic disease however enhancing subacute infarct is a consideration and short-term MRI follow-up without and with contrast is recommended in 2-4 weeks. Electronically Signed   By: Franchot Gallo M.D.   On: 05/29/2019 13:06   Dg Chest Port 1 View  Result Date: 05/30/2019 CLINICAL DATA:  Dyspnea, pleural effusion EXAM: PORTABLE CHEST 1 VIEW COMPARISON:  05/28/2019 FINDINGS: Slight interval increase in a moderate, layering right pleural effusion with associated atelectasis or consolidation. The left lung is normally aerated. Cardiomegaly. IMPRESSION: 1. Slight interval increase in a moderate, layering right pleural effusion with associated atelectasis or consolidation. 2.  The left lung is normally aerated. 3.  Cardiomegaly. Electronically Signed   By: Eddie Candle M.D.   On: 05/30/2019 14:58   Dg Chest Port 1 View  Result Date: 05/28/2019 CLINICAL DATA:  Shortness of breath. EXAM: PORTABLE CHEST 1 VIEW COMPARISON:  05/27/2019.  CT 05/28/2019.  04/29/2019. FINDINGS: Mediastinum unchanged. Stable cardiomegaly. Dense right middle lobe atelectatic changes again noted. Underlying mass lesion again cannot be excluded. Right pleural effusion has increased in size. Reference is made to today's chest CT for further evaluation. No pneumothorax. IMPRESSION: 1. Persistent right middle lobe atelectasis. Underlying mass lesion cannot be excluded. 2. Progressive right pleural effusion. Reference is made to today's chest CT for further evaluation. Electronically Signed   By: Marcello Moores  Register   On: 05/28/2019 12:10   Dg Chest Port  1 View  Result Date: 05/27/2019 CLINICAL DATA:  Pleural effusion. EXAM: PORTABLE CHEST 1 VIEW COMPARISON:  Chest x-ray May 27, 2019 FINDINGS: The right-sided pleural effusion is smaller in the interval after thoracentesis. No pneumothorax. Centralized somewhat masslike opacities seen in the right perihilar region, better assessed on previous CT imaging. A left lower veins clear. The cardiomediastinal silhouette is stable. IMPRESSION: 1. Centralized somewhat masslike opacity in the right perihilar region was better assessed on previous CT imaging and is stable. 2. The right-sided pleural effusion is smaller in the interval after thoracentesis with no pneumothorax. Electronically Signed   By: Dorise Bullion III M.D   On: 05/27/2019 18:51   Dg Chest Portable 1 View  Result Date: 05/27/2019 CLINICAL DATA:  Patient brought in by wife stating patient c/o shortness of breath, decreased appetite, dizziness and pain all over. EXAM: PORTABLE CHEST 1 VIEW COMPARISON:  04/30/2019 and earlier exams. FINDINGS: There is significant increased opacity on the right when compared to the most recent prior chest radiograph. There is at least a moderate pleural effusion. Additional opacity right perihilar and lower lung consistent with atelectasis, pneumonia or a combination. This has also increased from the prior exam. Left lung remains clear.  No left pleural effusion. Cardiac silhouette is normal in size. No mediastinal or left hilar mass.  Right hilum partly obscured. Skeletal structures are grossly intact. IMPRESSION: 1. Worsening aeration on the right compared to the most recent prior study. Previously noted right pleural effusion has enlarged. There is increased opacity in the right perihilar and right lung base is consistent with pneumonia, atelectasis or a combination, with atelectasis favored. Electronically Signed   By: Lajean Manes M.D.   On: 05/27/2019 12:07   Ct Angio Abd/pel W And/or Wo Contrast  Result  Date: 05/08/2019 CLINICAL DATA:  Question ischemia or renal infarct, recent diagnosis of atrial fibrillation, recent abnormal CT with persistent right abdominal pain. EXAM: CTA ABDOMEN AND PELVIS WITHOUT AND WITH CONTRAST TECHNIQUE: Multidetector CT imaging of the abdomen and pelvis was performed using the standard protocol during bolus administration of intravenous contrast. Multiplanar reconstructed images and MIPs were obtained and reviewed to evaluate the vascular anatomy. CONTRAST:  25mL OMNIPAQUE IOHEXOL 350 MG/ML SOLN COMPARISON:  CT 04/29/2019 FINDINGS: VASCULAR Aorta: Mixture of calcified noncalcified atherosclerotic plaque throughout the abdominal aorta. There is fusiform abdominal aortic aneurysm measuring up to 3 cm in maximal diameter. No periaortic stranding. No occlusion or stenosis. Celiac: Ostial plaque results in mild narrowing. Minimal plaque within the proximal vessel without significant stenosis. No evidence of aneurysm, dissection or vasculitis. SMA: Ostial plaque results and mild narrowing. Minimal atheromatous plaque in the proximal vessel. No significant stenosis. No evidence of aneurysm, dissection or vasculitis. Renals: Single renal arteries bilaterally. Plaque results in at least moderate narrowing of the proximal right renal artery. Normal distal opacification. Ostial plaque results and mild narrowing of the left renal artery as well. IMA: IMA arises from the fusiform abdominal aortic aneurysm. No evidence of aneurysm, dissection or vasculitis. Inflow: No extension of the aneurysm into the proximal inflow. Calcified noncalcified plaque seen throughout the inflow vessels. No aneurysm, dissection, vasculitis or significant stenosis. Proximal Outflow: Plaque noted in the common femoral arteries and at the level of the bifurcations without significant stenosis, aneurysm or dissection Veins: Venous phase imaging reveals no significant venous abnormality. Portal and hepatic veins are patent.  Review of the MIP images confirms the above findings. NON-VASCULAR Lower chest: Redemonstration of a moderate right pleural effusion with adjacent areas of passive atelectasis in the right lung base. No abnormal pleural thickening or enhancement. Small volume pericardial effusion. Cardiac size is within normal limits. Hepatobiliary: No focal liver abnormality is seen. No gallstones, gallbladder wall thickening, or biliary dilatation. Pancreas: Fatty replacement of the pancreas. No pancreatic ductal dilatation or surrounding inflammatory changes. Spleen: Normal in size without focal abnormality. Adrenals/Urinary Tract: Stable appearance of the 1.6 cm left adrenal nodule. Right adrenal gland is unremarkable. Exophytic left renal cysts are similar to prior exam. There is moderate bilateral perinephric stranding and low-attenuation striations of the bilateral renal parenchyma. Ill-defined rounded hypoattenuating lesions involving both kidneys are suspicious for lobar nephronia or developing abscess, less likely infarct in the setting of additional inflammation and infectious features. Right extrarenal pelvis. Urothelial thickening of both collecting systems. Circumferential thickening of the urinary bladder with bladder wall trabeculations. Stomach/Bowel: Distal esophagus, stomach and duodenal sweep are unremarkable. No small bowel wall thickening or dilatation. No evidence of obstruction. A normal appendix is visualized. No colonic dilatation or wall thickening. Lymphatic: No suspicious or enlarged lymph nodes in the included lymphatic chains. Reproductive: Borderline prostatomegaly. Other: No abdominopelvic free fluid or free gas. No bowel containing hernias. Small fat containing inguinal hernias. Musculoskeletal: Multilevel degenerative changes are present in the imaged portions of the spine. Findings most pronounced at L3-4  with a calcified disc bulge resulting in moderate canal stenosis. Features likely exacerbated  by what appear to be congenitally short pedicles. No acute osseous abnormality or suspicious osseous lesion. IMPRESSION: VASCULAR 1. Aortic Atherosclerosis (ICD10-I70.0). 2. Ostial plaque results in mild narrowing of the celiac, SMA, and left renal artery origins. 3. Moderate segmental narrowing of the proximal right renal artery secondary to atheromatous plaque. 4. Infrarenal abdominal aortic aneurysm measuring up 3 cm. Recommend followup by ultrasound in 3 years. This recommendation follows ACR consensus guidelines: White Paper of the ACR Incidental Findings Committee II on Vascular Findings. J Am Coll Radiol 2013; 10:789-794. Aortic aneurysm NOS (ICD10-I71.9) NON-VASCULAR 1. Bilateral perinephric and periureteral stranding with urothelial thickening, nephrographic striations and ill-defined rounded hypoattenuating lesions involving both kidneys, suspicious for lobar nephronia or developing abscess, less likely infarct given the additional features supporting an ascending urinary tract infection. 2. Circumferential thickening of the urinary bladder with bladder wall trabeculations, consistent with chronic outlet obstruction or neurogenic bladder. 3. Moderate right pleural effusion with adjacent areas of passive atelectasis in the right lung base. 4. Small volume pericardial effusion. These results were called by telephone at the time of interpretation on 05/08/2019 at 7:23 pm to provider [facility] , who verbally acknowledged these results. Electronically Signed   By: Lovena Le M.D.   On: 05/08/2019 19:24   Ir Thoracentesis Asp Pleural Space W/img Guide  Result Date: 05/31/2019 INDICATION: Patient with history of AFib on Eliquis, recently diagnosed malignant pleural effusion presented to the ED today with worsening dyspnea. Chest x-ray shows large right pleural effusion. Request IR for diagnostic and therapeutic thoracentesis. EXAM: ULTRASOUND GUIDED RIGHT THORACENTESIS MEDICATIONS: 8 mL 1% lidocaine  COMPLICATIONS: None immediate. PROCEDURE: An ultrasound guided thoracentesis was thoroughly discussed with the patient and questions answered. The benefits, risks, alternatives and complications were also discussed. The patient understands and wishes to proceed with the procedure. Written consent was obtained. Ultrasound was performed to localize and mark an adequate pocket of fluid in the right chest. The area was then prepped and draped in the normal sterile fashion. 1% Lidocaine was used for local anesthesia. Under ultrasound guidance a 6 Fr Safe-T-Centesis catheter was introduced. Thoracentesis was performed. The catheter was removed and a dressing applied. FINDINGS: A total of approximately 1.3 L of bloody fluid was removed. Samples were sent to the laboratory as requested by the clinical team. IMPRESSION: Successful ultrasound guided right thoracentesis yielding 1.3 L of pleural fluid. Read by Candiss Norse, PA-C Electronically Signed   By: Lucrezia Europe M.D.   On: 05/27/2019 17:07     Time Spent 35 minutes     Bonnell Public M.D on 05/31/2019 at 8:15 AM  To page go to [email or web address] - password [facility]

## 2019-05-31 NOTE — Significant Event (Signed)
Rapid Response Event Note  Called to assist with Thoracentesis with Dr Tamala Julian. Consent signed, time out performed. Approximately 768ml removed from R side. Pt tolerated procedure well. Vitals at need of procedure HR 97, BP 147/86, RR 22, spO2 98%. No c/o pain, pt returned to comfortable position in bed,wife at bedside.   Fluid samples sent down to lab, consent placed in chart.   Event Summary:   called at  1216   Event ended at  [facility]

## 2019-05-31 NOTE — Care Management (Addendum)
CM informed [facility] of [facility] - spoke directly with April the transfer coordinator.  No paper work requested at this time.  Pt is active with [facility], PCP is Dr Deatra Robinson.  CM left VM for [facility] SW Kellie Simmering at [phone]  Update:  Kellie Simmering called CM back.  Pt is service connected and can get [facility] for pleurx catheter (scheduled to be placed on 11/17 for malignant pleural effusions) if pt decides to utilize his [facility] benefit.  [facility] also informed CM that they can supply pleurx drains. Pt currently not oriented for choice - CM will continue to follow

## 2019-05-31 NOTE — Progress Notes (Signed)
Pt continues to pull everything off; leads, o2, fall band was torn off - pt attempting to pull out PIV. PIV is wrapped with multiple layers of kerlix. PIV still in place. Fall band replaced.

## 2019-05-31 NOTE — Progress Notes (Signed)
Patient agitated; keeps trying to climb/roll out of bed. Not redirectable. Insists that he needs to leave or else he's going to spend all his money here. Pt asked this RN to carry him to the car. Cannot put together facts of the situation to understand that he is in the hospital under care. Pt does not realize he is a bed and continues to try and get out of bed to get in bed. This RN continues to sit in pt's room because there are no sitters available.

## 2019-05-31 NOTE — Progress Notes (Signed)
CRITICAL VALUE ALERT  Critical Value:  CBG 413  Date & Time Notied:  05/31/2019 1710   Provider Notified: Kyra Searles MD  Orders Received/Actions taken: New orders to give 14 units of Novolog.

## 2019-05-31 NOTE — Progress Notes (Signed)
CRITICAL VALUE ALERT  Critical Value:  CBG 422   Date & Time Notied:  05/31/2019 1230   Provider Notified: Dana Allan MD  Orders Received/Actions taken: See new orders.

## 2019-05-31 NOTE — Procedures (Signed)
Thora w/ [place] Note Timeout performed Left chest examined with [place] and skin overlying fluid pocket marked Area prepped and anesthesized with 1% lidocaine 700  cc murky  fluid removed Bandaid applied to site CXR pending No immediate complications

## 2019-05-31 NOTE — Progress Notes (Signed)
Sitter still unavailable. Once staff leaves room, pt will pull everything off, gown, leads, o2, blood pressure cuff. Pt took off [place] and threw it to side. Pt will reach over and press buttons on progressive monitor screen- monitor moved, leads on back, o2 sensor on foot, blankets replaced. Pt adamant that he is going to leave here to go home and do nothing. He is not redirectable; cannot be reoriented. Pt states he is going to sneak out of here.

## 2019-05-31 NOTE — Care Management Important Message (Signed)
Important Message  Patient Details  Name: Brian French. MRN: [id number] Date of Birth: 05/29/1941   Medicare Important Message Given:  Yes     Zyrion Coey 05/31/2019, 3:01 PM

## 2019-06-01 ENCOUNTER — Inpatient Hospital Stay (HOSPITAL_COMMUNITY): Payer: Medicare Other | Admitting: Certified Registered Nurse Anesthetist

## 2019-06-01 ENCOUNTER — Encounter (HOSPITAL_COMMUNITY): Payer: Self-pay | Admitting: *Deleted

## 2019-06-01 ENCOUNTER — Encounter (HOSPITAL_COMMUNITY)
Admission: EM | Disposition: A | Discharge status: Home Health | Payer: Self-pay | Source: Home / Self Care | Attending: Internal Medicine

## 2019-06-01 ENCOUNTER — Inpatient Hospital Stay (HOSPITAL_COMMUNITY): Payer: Medicare Other

## 2019-06-01 HISTORY — PX: CHEST TUBE INSERTION: SHX231

## 2019-06-01 HISTORY — PX: VIDEO BRONCHOSCOPY WITH ENDOBRONCHIAL ULTRASOUND: SHX6177

## 2019-06-01 LAB — CBC WITH DIFFERENTIAL/PLATELET
Abs Immature Granulocytes: 1.97 10*3/uL — ABNORMAL HIGH (ref 0.00–0.07)
Basophils Absolute: 0.1 10*3/uL (ref 0.0–0.1)
Basophils Relative: 0 %
Eosinophils Absolute: 0.9 10*3/uL — ABNORMAL HIGH (ref 0.0–0.5)
Eosinophils Relative: 3 %
HCT: 31.7 % — ABNORMAL LOW (ref 39.0–52.0)
Hemoglobin: 10.6 g/dL — ABNORMAL LOW (ref 13.0–17.0)
Immature Granulocytes: 6 %
Lymphocytes Relative: 4 %
Lymphs Abs: 1.3 10*3/uL (ref 0.7–4.0)
MCH: 29.5 pg (ref 26.0–34.0)
MCHC: 33.4 g/dL (ref 30.0–36.0)
MCV: 88.3 fL (ref 80.0–100.0)
Monocytes Absolute: 3.3 10*3/uL — ABNORMAL HIGH (ref 0.1–1.0)
Monocytes Relative: 10 %
Neutro Abs: 25.5 10*3/uL — ABNORMAL HIGH (ref 1.7–7.7)
Neutrophils Relative %: 77 %
Platelets: 276 10*3/uL (ref 150–400)
RBC: 3.59 MIL/uL — ABNORMAL LOW (ref 4.22–5.81)
RDW: 15.1 % (ref 11.5–15.5)
WBC: 33 10*3/uL — ABNORMAL HIGH (ref 4.0–10.5)
nRBC: 0 % (ref 0.0–0.2)

## 2019-06-01 LAB — POTASSIUM
Potassium: 5.7 mmol/L — ABNORMAL HIGH (ref 3.5–5.1)
Potassium: 4.9 mmol/L (ref 3.5–5.1)
Potassium: 5.8 mmol/L — ABNORMAL HIGH (ref 3.5–5.1)
Potassium: 5.1 mmol/L (ref 3.5–5.1)

## 2019-06-01 LAB — CULTURE, BLOOD (ROUTINE X 2)
Culture: NO GROWTH
Culture: NO GROWTH
Special Requests: ADEQUATE

## 2019-06-01 LAB — RENAL FUNCTION PANEL
Albumin: 2.2 g/dL — ABNORMAL LOW (ref 3.5–5.0)
Anion gap: 11 (ref 5–15)
BUN: 55 mg/dL — ABNORMAL HIGH (ref 8–23)
CO2: 17 mmol/L — ABNORMAL LOW (ref 22–32)
Calcium: 8.7 mg/dL — ABNORMAL LOW (ref 8.9–10.3)
Chloride: 105 mmol/L (ref 98–111)
Creatinine, Ser: 1.69 mg/dL — ABNORMAL HIGH (ref 0.61–1.24)
GFR calc Af Amer: 44 mL/min — ABNORMAL LOW (ref >60)
GFR calc non Af Amer: 38 mL/min — ABNORMAL LOW (ref >60)
Glucose, Bld: 150 mg/dL — ABNORMAL HIGH (ref 70–99)
Phosphorus: 2.8 mg/dL (ref 2.5–4.6)
Potassium: 5.5 mmol/L — ABNORMAL HIGH (ref 3.5–5.1)
Sodium: 133 mmol/L — ABNORMAL LOW (ref 135–145)

## 2019-06-01 LAB — GLUCOSE, CAPILLARY
Glucose-Capillary: 214 mg/dL — ABNORMAL HIGH (ref 70–99)
Glucose-Capillary: 215 mg/dL — ABNORMAL HIGH (ref 70–99)
Glucose-Capillary: 233 mg/dL — ABNORMAL HIGH (ref 70–99)
Glucose-Capillary: 268 mg/dL — ABNORMAL HIGH (ref 70–99)
Glucose-Capillary: 300 mg/dL — ABNORMAL HIGH (ref 70–99)
Glucose-Capillary: 184 mg/dL — ABNORMAL HIGH (ref 70–99)

## 2019-06-01 LAB — CULTURE, BODY FLUID W GRAM STAIN -BOTTLE: Culture: NO GROWTH

## 2019-06-01 LAB — MAGNESIUM: Magnesium: 2.5 mg/dL — ABNORMAL HIGH (ref 1.7–2.4)

## 2019-06-01 SURGERY — BRONCHOSCOPY, WITH EBUS
Anesthesia: General

## 2019-06-01 MED ORDER — PROPOFOL 10 MG/ML IV BOLUS
INTRAVENOUS | Status: DC | PRN
  Administered 2019-06-01: 20 mg via INTRAVENOUS
  Administered 2019-06-01: 70 mg via INTRAVENOUS
  Administered 2019-06-01: 20 mg via INTRAVENOUS

## 2019-06-01 MED ORDER — PROPOFOL 10 MG/ML IV BOLUS: INTRAVENOUS | Status: DC | PRN

## 2019-06-01 MED ORDER — SODIUM ZIRCONIUM CYCLOSILICATE 10 G PO PACK
10.0000 g | PACK | Freq: Once | ORAL | Status: AC
  Administered 2019-06-01: 10 g via ORAL
  Filled 2019-06-01: qty 1

## 2019-06-01 MED ORDER — LACTATED RINGERS IV SOLN
INTRAVENOUS | Status: DC | PRN
  Administered 2019-06-01: 14:00:00 via INTRAVENOUS

## 2019-06-01 MED ORDER — ESMOLOL HCL 100 MG/10ML IV SOLN
INTRAVENOUS | Status: AC
  Filled 2019-06-01: qty 10

## 2019-06-01 MED ORDER — ESMOLOL HCL 100 MG/10ML IV SOLN
INTRAVENOUS | Status: DC | PRN
  Administered 2019-06-01 (×4): 20 mg via INTRAVENOUS

## 2019-06-01 MED ORDER — PHENYLEPHRINE 40 MCG/ML (10ML) SYRINGE FOR IV PUSH (FOR BLOOD PRESSURE SUPPORT)
PREFILLED_SYRINGE | INTRAVENOUS | Status: DC | PRN
  Administered 2019-06-01 (×2): 80 ug via INTRAVENOUS

## 2019-06-01 MED ORDER — EPHEDRINE SULFATE-NACL 50-0.9 MG/10ML-% IV SOSY
PREFILLED_SYRINGE | INTRAVENOUS | Status: DC | PRN
  Administered 2019-06-01: 10 mg via INTRAVENOUS

## 2019-06-01 MED ORDER — FENTANYL CITRATE (PF) 100 MCG/2ML IJ SOLN: 25.0000 ug | INTRAMUSCULAR | Status: DC | PRN

## 2019-06-01 MED ORDER — 0.9 % SODIUM CHLORIDE (POUR BTL) OPTIME
TOPICAL | Status: DC | PRN
  Administered 2019-06-01: 2000 mL

## 2019-06-01 MED ORDER — ONDANSETRON HCL 4 MG/2ML IJ SOLN
4.0000 mg | Freq: Once | INTRAMUSCULAR | Status: AC | PRN
  Administered 2019-06-01: 4 mg via INTRAVENOUS

## 2019-06-01 MED ORDER — LIDOCAINE HCL 1 % IJ SOLN
INTRAMUSCULAR | Status: DC | PRN
  Administered 2019-06-01: 10 mL

## 2019-06-01 MED ORDER — ALBUTEROL SULFATE HFA 108 (90 BASE) MCG/ACT IN AERS
INHALATION_SPRAY | RESPIRATORY_TRACT | Status: DC | PRN
  Administered 2019-06-01: 4 via RESPIRATORY_TRACT

## 2019-06-01 MED ORDER — ONDANSETRON HCL 4 MG/2ML IJ SOLN
INTRAMUSCULAR | Status: AC
  Filled 2019-06-01: qty 2

## 2019-06-01 MED ORDER — INSULIN ASPART 100 UNIT/ML IV SOLN
10.0000 [IU] | Freq: Once | INTRAVENOUS | Status: AC
  Administered 2019-06-01: 10 [IU] via INTRAVENOUS

## 2019-06-01 MED ORDER — PHENYLEPHRINE HCL-NACL 10-0.9 MG/250ML-% IV SOLN
INTRAVENOUS | Status: DC | PRN
  Administered 2019-06-01: 25 ug/min via INTRAVENOUS

## 2019-06-01 MED ORDER — PROPOFOL 500 MG/50ML IV EMUL
INTRAVENOUS | Status: DC | PRN
  Administered 2019-06-01: 50 ug/kg/min via INTRAVENOUS

## 2019-06-01 MED ORDER — CALCIUM GLUCONATE-NACL 1-0.675 GM/50ML-% IV SOLN
1.0000 g | Freq: Once | INTRAVENOUS | Status: AC
  Administered 2019-06-01: 1000 mg via INTRAVENOUS
  Filled 2019-06-01: qty 50

## 2019-06-01 MED ORDER — ALBUTEROL SULFATE (2.5 MG/3ML) 0.083% IN NEBU
INHALATION_SOLUTION | RESPIRATORY_TRACT | Status: AC
  Filled 2019-06-01: qty 6

## 2019-06-01 MED ORDER — DEXAMETHASONE SODIUM PHOSPHATE 10 MG/ML IJ SOLN
INTRAMUSCULAR | Status: DC | PRN
  Administered 2019-06-01: 4 mg via INTRAVENOUS

## 2019-06-01 MED ORDER — EPINEPHRINE PF 1 MG/ML IJ SOLN
INTRAMUSCULAR | Status: AC
  Filled 2019-06-01: qty 1

## 2019-06-01 MED ORDER — FENTANYL CITRATE (PF) 250 MCG/5ML IJ SOLN
INTRAMUSCULAR | Status: AC
  Filled 2019-06-01: qty 5

## 2019-06-01 MED ORDER — ALBUTEROL SULFATE (2.5 MG/3ML) 0.083% IN NEBU
5.0000 mg | INHALATION_SOLUTION | Freq: Once | RESPIRATORY_TRACT | Status: AC
  Administered 2019-06-01: 18:00:00 5 mg via RESPIRATORY_TRACT

## 2019-06-01 MED ORDER — HALOPERIDOL LACTATE 5 MG/ML IJ SOLN
2.0000 mg | Freq: Once | INTRAMUSCULAR | Status: DC
  Filled 2019-06-01: qty 1

## 2019-06-01 MED ORDER — ONDANSETRON HCL 4 MG/2ML IJ SOLN
4.0000 mg | Freq: Once | INTRAMUSCULAR | Status: AC
  Administered 2019-06-01: 4 mg via INTRAVENOUS

## 2019-06-01 MED ORDER — DEXTROSE 50 % IV SOLN
1.0000 | Freq: Once | INTRAVENOUS | Status: AC
  Administered 2019-06-01: 50 mL via INTRAVENOUS
  Filled 2019-06-01: qty 50

## 2019-06-01 MED ORDER — DILTIAZEM HCL 25 MG/5ML IV SOLN
5.0000 mg | Freq: Once | INTRAVENOUS | Status: AC
  Administered 2019-06-01: 5 mg via INTRAVENOUS
  Filled 2019-06-01: qty 5

## 2019-06-01 MED ORDER — LIDOCAINE 2% (20 MG/ML) 5 ML SYRINGE
INTRAMUSCULAR | Status: DC | PRN
  Administered 2019-06-01: 100 mg via INTRAVENOUS

## 2019-06-01 MED ORDER — LIDOCAINE HCL (PF) 1 % IJ SOLN
INTRAMUSCULAR | Status: AC
  Filled 2019-06-01: qty 30

## 2019-06-01 MED ORDER — LACTATED RINGERS IV SOLN
INTRAVENOUS | Status: DC
  Administered 2019-06-01 (×3): via INTRAVENOUS

## 2019-06-01 SURGICAL SUPPLY — 54 items
ADAPTER VALVE BIOPSY EBUS (MISCELLANEOUS) IMPLANT
ADPTR VALVE BIOPSY EBUS (MISCELLANEOUS)
BALLN FOR EBUS SCOPE (BALLOONS) ×9
BALLOON FOR EBUS SCOPE (BALLOONS) IMPLANT
BALN ESCP STRL LXBF-UC160F (BALLOONS) ×3
BRUSH CYTOL CELLEBRITY 1.5X140 (MISCELLANEOUS) IMPLANT
CANISTER SUCT 3000ML PPV (MISCELLANEOUS) ×3 IMPLANT
CONT SPEC 4OZ CLIKSEAL STRL BL (MISCELLANEOUS) ×5 IMPLANT
COVER BACK TABLE 60X90IN (DRAPES) ×5 IMPLANT
COVER PROBE W GEL 5X96 (DRAPES) ×2 IMPLANT
COVER SURGICAL LIGHT HANDLE (MISCELLANEOUS) ×2 IMPLANT
DRAPE LAPAROSCOPIC ABDOMINAL (DRAPES) ×2 IMPLANT
FORCEPS BIOP RJ4 1.8 (CUTTING FORCEPS) IMPLANT
FORCEPS RADIAL JAW LRG 4 PULM (INSTRUMENTS) IMPLANT
GAUZE SPONGE 4X4 12PLY STRL (GAUZE/BANDAGES/DRESSINGS) ×3 IMPLANT
GLOVE BIO SURGEON STRL SZ 6 (GLOVE) ×4 IMPLANT
GLOVE BIO SURGEON STRL SZ7.5 (GLOVE) ×1 IMPLANT
GLOVE BIOGEL PI IND STRL 6 (GLOVE) IMPLANT
GLOVE BIOGEL PI IND STRL 6.5 (GLOVE) IMPLANT
GLOVE BIOGEL PI IND STRL 7.0 (GLOVE) IMPLANT
GLOVE BIOGEL PI INDICATOR 6 (GLOVE) ×4
GLOVE BIOGEL PI INDICATOR 6.5 (GLOVE) ×2
GLOVE BIOGEL PI INDICATOR 7.0 (GLOVE) ×2
GLOVE SURG SS PI 6.0 STRL IVOR (GLOVE) ×4 IMPLANT
GOWN STRL REUS W/ TWL LRG LVL3 (GOWN DISPOSABLE) ×1 IMPLANT
GOWN STRL REUS W/TWL LRG LVL3 (GOWN DISPOSABLE) ×3
KIT BASIN OR (CUSTOM PROCEDURE TRAY) ×2 IMPLANT
KIT CLEAN ENDO COMPLIANCE (KITS) ×6 IMPLANT
KIT PLEURX DRAIN CATH 1000ML (MISCELLANEOUS) ×2 IMPLANT
KIT PLEURX DRAIN CATH 15.5FR (DRAIN) ×2 IMPLANT
KIT TURNOVER KIT B (KITS) ×3 IMPLANT
MARKER SKIN DUAL TIP RULER LAB (MISCELLANEOUS) ×3 IMPLANT
NDL ASPIRATION VIZISHOT 19G (NEEDLE) IMPLANT
NDL ASPIRATION VIZISHOT 21G (NEEDLE) IMPLANT
NEEDLE ASPIRATION VIZISHOT 19G (NEEDLE) IMPLANT
NEEDLE ASPIRATION VIZISHOT 21G (NEEDLE) ×3 IMPLANT
NS IRRIG 1000ML POUR BTL (IV SOLUTION) ×3 IMPLANT
OIL SILICONE PENTAX (PARTS (SERVICE/REPAIRS)) ×3 IMPLANT
PAD ARMBOARD 7.5X6 YLW CONV (MISCELLANEOUS) ×6 IMPLANT
RADIAL JAW LRG 4 PULMONARY (INSTRUMENTS) ×2
SPONGE LAP 18X18 RF (DISPOSABLE) ×2 IMPLANT
SYR 20ML ECCENTRIC (SYRINGE) ×6 IMPLANT
SYR 20ML LL LF (SYRINGE) ×4 IMPLANT
SYR 50ML SLIP (SYRINGE) IMPLANT
SYR 5ML LUER SLIP (SYRINGE) ×1 IMPLANT
TOWEL GREEN STERILE FF (TOWEL DISPOSABLE) ×3 IMPLANT
TRAP SPECIMEN MUCOUS 40CC (MISCELLANEOUS) ×2 IMPLANT
TUBE CONNECTING 20'X1/4 (TUBING) ×2
TUBE CONNECTING 20X1/4 (TUBING) ×4 IMPLANT
UNDERPAD 30X30 (UNDERPADS AND DIAPERS) ×3 IMPLANT
VALVE BIOPSY  SINGLE USE (MISCELLANEOUS) ×2
VALVE BIOPSY SINGLE USE (MISCELLANEOUS) ×1 IMPLANT
VALVE SUCTION BRONCHIO DISP (MISCELLANEOUS) ×3 IMPLANT
WATER STERILE IRR 1000ML POUR (IV SOLUTION) ×3 IMPLANT

## 2019-06-01 NOTE — Progress Notes (Signed)
Pt has become increasingly combative and agitated throughout the night. Has pulled out two IVs during the shift, refusing telemetry, and pushing/cursing at staff. Patient is refusing to stay in bed and stating verbal threats. I have notified MD Baltazar Najjar as well as put an IV team consult in for a new IV as pt is a hard stick.

## 2019-06-01 NOTE — Progress Notes (Signed)
Dr. Roanna Banning made aware of pre-op lab work. Labs are within acceptable range per Dr. Roanna Banning.

## 2019-06-01 NOTE — TOC Initial Note (Addendum)
Transition of Care (TOC) - Initial/Assessment Note    Patient Details  Name: Brian French. MRN: [id number] Date of Birth: 06-15-1941  Transition of Care [facility]) CM/SW Contact:    Maryclare Labrador, RN Phone Number: 06/01/2019, 11:24 AM  Clinical Narrative:     Pt is now oriented - CM was able to have phone conversation with both pt and wife at bedside.  Pt confirms his PCP is Dr Deatra Robinson with the [facility].  Pt informed CM that he gets his prescriptions via mail covered by [place] as he does not have medicare part D.  Pt will however utilize a local [facility] If new discharge prescriptions are written.   Readmission assessment initiated - pt has had previous admissions related to respiratory secondary to Lung CA.    Pt is now s/p bronch and may get plerux catheter.  CM discussed the need for [facility] to assist with management of the drain at discharge - wife/pt in agreement. Pt/wife would like to use his medicare benefit for [facility] instead of the [facility] benefit.   CM will have bedside nurse hand deliver medicare.gov HH list to pts bedside - CM will follow up with pt/wife on [facility] choice for [facility].         Patient Goals and CMS Choice        Expected Discharge Plan and Services                                                Prior Living Arrangements/Services                       Activities of Daily Living Home Assistive Devices/Equipment: None ADL Screening (condition at time of admission) Patient's cognitive ability adequate to safely complete daily activities?: Yes Is the patient deaf or have difficulty hearing?: Yes Does the patient have difficulty seeing, even when wearing glasses/contacts?: No Does the patient have difficulty concentrating, remembering, or making decisions?: Yes Patient able to express need for assistance with ADLs?: Yes Does the patient have difficulty dressing or bathing?: No Independently performs ADLs?: Yes (appropriate for developmental age) Does  the patient have difficulty walking or climbing stairs?: Yes Weakness of Legs: Both Weakness of Arms/Hands: Both  Permission Sought/Granted                  Emotional Assessment              Admission diagnosis:  Pleural effusion [J90] Malignant pleural effusion [J91.0] AKI (acute kidney injury) ([place]) [N17.9] Pneumonia of right lung due to infectious organism, unspecified part of lung [J18.9] Sepsis, due to unspecified organism, unspecified whether acute organ dysfunction present [facility]) [A41.9] Patient Active Problem List   Diagnosis Date Noted  . Frontal mass of brain 05/29/2019  . Vasogenic brain edema ([place]) 05/29/2019  . Hilar mass 05/28/2019  . Hyponatremia 05/28/2019  . Metabolic acidosis, normal anion gap (NAG) 05/28/2019  . AF (paroxysmal atrial fibrillation) ([place]) 05/28/2019  . BPH (benign prostatic hyperplasia) 05/28/2019  . AAA (abdominal aortic aneurysm) ([place]) 05/28/2019  . Pneumonia due to infectious agent 05/27/2019  . Malignant pleural effusion 05/27/2019  . AKI (acute kidney injury) ([place]) 05/27/2019  . Hyperkalemia 05/27/2019  . Atrial fibrillation with RVR ([place]) 04/30/2019  . Acute pyelonephritis 04/30/2019  . Parapneumonic effusion 04/30/2019  . Type 2 diabetes mellitus with  hyperlipidemia ([place]) 04/30/2019  . Community acquired pneumonia of right upper lobe of lung 04/29/2019  . Vertigo 05/22/2015  . Headache disorder 05/22/2015   PCP:  [facility], [facility] Pharmacy:   [facility] [phone] [address] AT [facility] [address] [place] [address] Phone: [phone] Fax: [place], [place] - [employer] [place] [phone] [place] [address] Phone: [phone] Fax: [phone]     Social Determinants of Health (SDOH) Interventions    Readmission Risk Interventions Readmission Risk Prevention Plan  06/01/2019  Transportation Screening Complete  Medication Review ([place]) Complete  PCP or Specialist appointment within 3-5 days of discharge (No Data)  Some recent data might be hidden

## 2019-06-01 NOTE — Transfer of Care (Signed)
Immediate Anesthesia Transfer of Care Note  Patient: Brian French.  Procedure(s) Performed: VIDEO BRONCHOSCOPY WITH ENDOBRONCHIAL ULTRASOUND (N/A ) INSERTION PLEURAL DRAINAGE CATHETER (N/A )  Patient Location: PACU  Anesthesia Type:General  Level of Consciousness: drowsy  Airway & Oxygen Therapy: Patient Spontanous Breathing and Patient connected to face mask oxygen  Post-op Assessment: Report given to RN and Post -op Vital signs reviewed and stable  Post vital signs: Reviewed and stable  Last Vitals:  Vitals Value Taken Time  BP 116/67   Temp    Pulse 106   Resp 18   SpO2 98     Last Pain:  Vitals:   06/01/19 1354  TempSrc:   PainSc: 0-No pain         Complications: No apparent anesthesia complications

## 2019-06-01 NOTE — H&P (Signed)
Pre-op H&P:  No changes since previous. NPO since midnight other than a few ice chips early this morning. He has been confused and his wife has been remaining with him to control agitation. She accompanies him to pre-op. Chart reviewed. Off DOAC since Friday.  PMH, PSH, Fhx, Soc hx reviewed.  BP 120/73   Pulse 97   Temp (!) 97.4 F (36.3 C) (Oral)   Resp 16   Ht 5\' 11"  (1.803 m)   Wt 99.1 kg   SpO2 98%   BMI 30.47 kg/m   Sitting up in bed in NAD Breathing comfortably on RA, reduced breath sounds in right base RRR, no murmurs abd soft, NT No rashes Normal speech, face symmetric, moving all extremities. Requires frequent re-orientation from his wife and reminders about things we discussed a few minutes prior.  Labs notable for K+ 5.7   Assessment & Plan:  Hyperkalemia- tx with lokelma 10mg  PO x 1 dose this morning  Right lung mass with mediastinal adenopathy and MPE and brain mass. Suspect primary lung cancer. -pleurX -bronchoscopy with Ebus and possible transbronchial biopsies -appreciate Anesthesia's assistance  Confusion- likely 2/2 frontal brain mass -wife will accompany to pre-& post-op  Procedures explained in detail, including the benefits, risks, and alternatives. Brian French and his wife understand and have had questions answered. Consent signed in the chart by his wife, who is at bedside.  Brian Hy, DO 06/01/19 2:05 PM [facility]

## 2019-06-01 NOTE — Progress Notes (Signed)
Daily progress note Upon meeting patient was agitated and did not want medical staff Present. Wife was contacted to visit with patient. Patient agreed to having a new IV line placed on Right forearm. Pt went for EUS (bronchoscopy) accompanied by wife. Pt tolerated pleurax placement and bronchoscopy well, had 1 liter of fluid pulled, is alert to self  Has a safety sitter at bedside. Possible d/c tomorrow if he does well throughout the night. Pt has been advanced to carb mod. Pt was given diltiazem for afib

## 2019-06-01 NOTE — Op Note (Signed)
Video Bronchoscopy with Endobronchial Ultrasound Procedure Note  Date of Operation: 06/01/2019  Pre-op Diagnosis: lung cancer, malignant pleural effusion  Post-op Diagnosis: lung cancer, Malignant pleural effusion  Surgeon: Julian Hy  Assistants: see OR record  Anesthesia: General LMA anesthesia  Operation: Flexible video fiberoptic bronchoscopy with endobronchial ultrasound and biopsies.  Estimated Blood Loss: Minimal  Complications: none  Indications and History: Brian French. is a 78 y.o. male with a recurrent exudative malignant pleural effusion, mediastinal adenopathy, and a lung mass.  The risks, benefits, complications, treatment options and expected outcomes were discussed with the patient and his wife.  The possibilities of pneumothorax, pneumonia, reaction to medication, pulmonary aspiration, perforation of a viscus, bleeding, failure to diagnose a condition, and creating a complication requiring transfusion or operation were discussed with the patient who freely signed the consent. All questions were answered.   Description of Procedure: The patient was examined in the preoperative area and history and data from the preprocedure consultation were reviewed. It was deemed appropriate to proceed.  The patient was taken to the OR, identified as Brian French. with DOB [id number] and MRN [id number], and the procedure verified as R pleurX catheter placement, Flexible Video Fiberoptic Bronchoscopy, and EBUS .  A Time Out was held and the above information confirmed.    Using an [place] to locate dependent free flowing pleural fluid in the right lower hemithorax.  Lidocaine was used to anesthetize the skin and subcutaneous tissue in addition to the pleura.  A needle was used to advance into the pleural space withdrawing pleural fluid, and a catheter was passed over the needle into the fluid.  A guidewire was placed into the pleural fluid and catheter withdrawn.  A second  incision was made about 5 cm anterior to the primary incision and the catheter with subcutaneous tunneling dilator was passed through this incision and tunneled through the subcutaneous tissue to the incision with a guidewire.  Dilator was placed over the guidewire, followed by the dilator with a peel-away catheter.  The dilator was withdrawn, and the Pleurx drain catheter was placed through the peel-away catheter.  There were no kinks in the catheter.  The peel-away dilator was fully removed.  The incision was closed with 1 suture.  2 sutures were placed around the chest tube.  1 L of pleural fluid was drained in the operating room, which was sent for cytology.  After being taken to the operating room general anesthesia was initiated and the patient  was orally intubated. The video fiberoptic bronchoscope was introduced via the endotracheal tube and a general inspection was performed which showed narrowing of the right middle lobe bronchus. The standard scope was then withdrawn and the endobronchial ultrasound was used to identify and characterize the peratracheal, hilar, and bronchial lymph nodes. Inspection showed adenopathy in station 7 and station 11 [place] had a visible 0.5cm node that was not sampled.  Matted areas of tissue without obvious LNs were present in the 4R lateral and pretracheal locations, which were not sampled. Using real-time ultrasound guidance, Wang needle biopsies were take from Station 7 and 11R nodes and were sent for pathology.The patient tolerated the procedure well without apparent complications. There was no significant blood loss. The bronchoscope was withdrawn. Using the flexible fiberoptic bronchoscope endobronchial biopsies of the RML were obtained due to desire for additional tissue.  Anesthesia was reversed, and the patient was taken to the PACU for recovery.   Samples: 1. Wang needle biopsies from 11L  node 2. Wang needle biopsies from 7 node 3. Cytology pleural fluid  R 4. Endobronchial biopsies RML   Plans:  The patient will be discharged to the floor from the PACU when recovered from anesthesia. We will review the cytology & pathology  results with the patient when they become available. Outpatient followup will be with [facility] in clinic.  Follow up CXR for pleurX placement.   Julian Hy, DO 06/01/19 5:12 PM [facility]

## 2019-06-01 NOTE — Progress Notes (Signed)
Inpatient Diabetes Program Recommendations  AACE/ADA: New Consensus Statement on Inpatient Glycemic Control (2015)  Target Ranges:  Prepandial:   less than 140 mg/dL      Peak postprandial:   less than 180 mg/dL (1-2 hours)      Critically ill patients:  140 - 180 mg/dL   Results for Brian French, Brian French (MRN [id number]) as of 06/01/2019 09:51  Ref. Range 05/31/2019 07:25 05/31/2019 12:08 05/31/2019 16:56 05/31/2019 20:37  Glucose-Capillary Latest Ref Range: 70 - 99 mg/dL 297 (H)  10 units NOVOLOG  422 (H)  12 units NOVOLOG +  10 units LANTUS  413 (H)  14 units NOVOLOG  335 (H)  4 units NOVOLOG +  10 units LANTUS    Results for Brian French, Brian French (MRN [id number]) as of 06/01/2019 09:51  Ref. Range 06/01/2019 07:17  Glucose-Capillary Latest Ref Range: 70 - 99 mg/dL 214 (H)  5 units NOVOLOG     Home DM Meds: Glipizide 10 mg BID       70/30 Insulin 20 units Daily       Metformin 500 mg BID       Actos 45 mg Daily   Current Orders: Novolog Moderate Correction Scale/ SSI (0-15 units) TID AC + HS      Lantus 10 units BID    MD- Note that Decadron reduced to 4 mg Daily today (was 4 mg Q6 hours).  Lantus started yesterday AM.  CBG somewhat improved this AM (down to 214).  If patient continues to have significant afternoon CBG elevations, may consider adding Novolog Meal Coverage:  Novolog 6 units TID with meals  (Please add the following Hold Parameters: Hold if pt eats <50% of meal, Hold if pt NPO)      --Will follow patient during hospitalization--  Wyn Quaker RN, MSN, CDE Diabetes Coordinator Inpatient Glycemic Control Team Team Pager: [phone] (8a-5p)

## 2019-06-01 NOTE — Anesthesia Preprocedure Evaluation (Addendum)
Anesthesia Evaluation  Patient identified by MRN, date of birth, ID band Patient awake    Reviewed: Allergy & Precautions, NPO status , Patient's Chart, lab work & pertinent test results  Airway Mallampati: I  TM Distance: >3 FB Neck ROM: Full    Dental  (+) Chipped,    Pulmonary former smoker,    Pulmonary exam normal breath sounds clear to auscultation       Cardiovascular hypertension, Normal cardiovascular exam+ dysrhythmias Atrial Fibrillation  Rhythm:Regular Rate:Normal  ECG: a-fib, rate 97   Neuro/Psych  Headaches, negative psych ROS   GI/Hepatic negative GI ROS, Neg liver ROS,   Endo/Other  diabetes, Oral Hypoglycemic Agents  Renal/GU Renal InsufficiencyRenal disease     Musculoskeletal negative musculoskeletal ROS (+)   Abdominal (+) + obese,   Peds  Hematology  (+) anemia , HLD   Anesthesia Other Findings MALIGNANT PLURAL EFUSION  Reproductive/Obstetrics                          Anesthesia Physical Anesthesia Plan  ASA: III  Anesthesia Plan: General   Post-op Pain Management:    Induction: Intravenous  PONV Risk Score and Plan: 2 and Dexamethasone, Ondansetron and Treatment may vary due to age or medical condition  Airway Management Planned: LMA  Additional Equipment:   Intra-op Plan:   Post-operative Plan: Extubation in OR  Informed Consent: I have reviewed the patients History and Physical, chart, labs and discussed the procedure including the risks, benefits and alternatives for the proposed anesthesia with the patient or authorized representative who has indicated his/her understanding and acceptance.     Dental advisory given  Plan Discussed with: CRNA  Anesthesia Plan Comments:        Anesthesia Quick Evaluation

## 2019-06-01 NOTE — Progress Notes (Signed)
Pharmacy Antibiotic Note  Brian French. is a 78 y.o. male admitted on 05/27/2019 with pneumonia. Multiple comorbidities, pt also has cancer, unknown primary source. Patient is on day #6 cefepime. Blood cultures and ngtd. Pt did grow yeast from urine. Renal fx, stable   Plan: Continue Cefepime 2gm IV Q12H - recommend discontinuing tomorrow after total of 7 days F/u renal fxn, C&S, clinical status and peak/trough at SS  Height: 5\' 11"  (180.3 cm) Weight: 218 lb 7.6 oz (99.1 kg) IBW/kg (Calculated) : 75.3  Temp (24hrs), Avg:97.7 F (36.5 C), Min:97.3 F (36.3 C), Max:98.3 F (36.8 C)  Recent Labs  Lab 05/27/19 1205 05/27/19 1414 05/28/19 0345 05/29/19 0428 05/30/19 0446 05/30/19 1347 05/31/19 0038 06/01/19 0214  WBC  --   --  32.0* 29.6* 25.9*  --  34.8* 33.0*  CREATININE  --   --  2.02* 1.71* 1.70* 1.61* 1.80* 1.69*  LATICACIDVEN 1.8 1.5  --   --   --   --   --   --      Antimicrobials this admission: Vanc 11/12>> 11/17 Cefepime 11/12>>  Microbiology results: 11/12 Pleural fluid gm stain- negative 11/12 Pleural fluid cx >> NG <24h 11/12 COVID negative 11/12 UCx: >100K yeast  Brian French 06/01/2019 10:31 AM

## 2019-06-01 NOTE — Progress Notes (Addendum)
PROGRESS NOTE    Brian French.  IEP:[id number] DOB: [birth year] DOA: 05/27/2019  PCP: [facility]    LOS - 5   Brief Narrative:  78 year old male with medical history significant for type 2 diabetes, HLD, HTN, A. fib on Eliquis, recent diagnosis of malignant pleural effusion and recent hospitalization (10/15-10/19) for right-sided pneumonia complicated by parapneumonic malignant effusion.  Patient presented with worsening dyspnea on exertion and mild nonproductive cough.  Chest x-ray was concerning for masslike opacity at the right perihilar area with enlarged right pleural effusion.  Patient was admitted for further assessment and management.  Patient has been on broad-spectrum antibiotics.  Patient underwent thoracentesis by IR on 11/12 with 1.3 L of bloody fluid removed.  There are concerns for possible malignancy, probably, lung cancer.  Pulmonary team has been consulted.  Pulmonary team plans bronchoscopy with biopsy and likely Pleurx catheter placement, 06/01/2019.  MRI brain revealed right frontal lobe mass with vasogenic edema, likely metastatic lesion.  Patient is on IV dexamethasone.   ADDENDUM: Discussed with Dr. Carlis Abbott after procedures.  Care management consulted to get pleurX bottles for home (1 liter bottles, 1 per day, x1mo).  Patient likely to see Dr. Carlis Abbott in clinic Thursday.  Post-procedure CXR pending.  If stable overnight, can be d/c'd from [facility] standpoint.  Subjective 11/17: Patient awake and sitting up in bed, wife at bedside.  Overnight, it was reported patient was agitated and combative with nursing staff.  Sitter requested for tonight.  Patient denies acute complaints at this time including headache, vision changes, chest pain, shortness of breath, fevers or chills.  Wife reports patient has not been eating much for the past month, and she has noted some changes in his personality.  Assessment & Plan:   Principal Problem:   Pneumonia due to infectious  agent Active Problems:   Atrial fibrillation with RVR (HCC)   Type 2 diabetes mellitus with hyperlipidemia (HCC)   Malignant pleural effusion   AKI (acute kidney injury) (HCC)   Hyperkalemia   Hilar mass   Hyponatremia   Metabolic acidosis, normal anion gap (NAG)   AF (paroxysmal atrial fibrillation) (HCC)   BPH (benign prostatic hyperplasia)   AAA (abdominal aortic aneurysm) (HCC)   Frontal mass of brain   Vasogenic brain edema (HCC)   Acute hypoxic respiratory failure: Resolved -Likely multifactorial secondary to malignant pleural effusion and probable lung cancer (see above narrative). -Pulmonary following, for EBUS and Pleurx placement today -Currently not requiring supplemental oxygen  Right-sided malignant pleural effusion: -Prior thoracentesis noted, 1.2 L  -For bronchoscopy tomorrow and likely Pleurx catheter placement.  Masslike opacity in right perihilar region: -Concerns for primary lung cancer -EBUS and Pleurx placement today with pulmonary -Guarded prognosis if it is lung cancer.  Right brain (frontal lobe) mass: -Likely metastatic lesion with primary lung cancer -Continue IV dexamethasone    SIRS: Resolved.    AKI on CKD stage IIIb: -Baseline CKD 3B  -Current worsening likely multifactorial  -We will get a renal ultrasound  -Urine protein and creatinine  -Extent of work-up will depend on goal of care  -AKI has peaked.   -Avoid nephrotoxins  -Dose all medications assuming GFR of less than 15 mils per minute   Normal anion gap metabolic acidosis with hyperkalemia: -This could be RTA type IV -Manage hyperkalemia -Optimize blood sugar control -Bicarb to correct acidosis, rather cautiously.    Acute hyponatremia: -Likely SIADH, possibly paraneoplastic secondary to malignancy  -Urine osmolality and serum osmolality noted -Presence of  hyperkalemia may be indicative of possible hypoaldosteronism.  Cortisol is 2.2, but patient is on  dexamethasone.  Permanent atrial fibrillation: -Optimize heart rate. -Anticoagulation is on hold.  Type 2 diabetes: -Uncontrolled -Start subcutaneous Lantus 10 units twice daily -Change sliding scale insulin coverage to moderate scale -Hold 5 units of insulin premeal.  -Continue to optimize.   Hyperlipidemia: -Continue Lipitor.  BPH: -Self caths.  -Low threshold to consider Foley catheterization as patient's memory can not be trusted.    Yeast in urine:  -Likely related to catheterization.   -Urine culture resulted on 05/27/2019  3 cm infrarenal AAA: -Monitor as per protocol   DVT prophylaxis: SCDs   Code Status: Full Code  Family Communication: Wife at bedside today, 11/17 Disposition Plan: Pending further clinical improvement and procedures   Consultants:   Pulmonary  Procedures:  Thoracentesis on 11/12: With 1.3 L sanguinous fluid removed   Antimicrobials:   None   Objective: Vitals:   06/01/19 0254 06/01/19 0255 06/01/19 0714 06/01/19 1208  BP:  99/79 126/64 120/73  Pulse:    97  Resp:    16  Temp: 98.3 F (36.8 C)  (!) 97.4 F (36.3 C) (!) 97.4 F (36.3 C)  TempSrc: Oral  Oral Oral  SpO2: 99%   98%  Weight: 99.1 kg     Height:        Intake/Output Summary (Last 24 hours) at 06/01/2019 1249 Last data filed at 06/01/2019 0151 Gross per 24 hour  Intake --  Output 1200 ml  Net -1200 ml   Filed Weights   05/30/19 0300 05/31/19 0424 06/01/19 0254  Weight: 99 kg 99.2 kg 99.1 kg    Examination:  General exam: awake, alert, no acute distress, obese Respiratory system: clear to auscultation but decreased breath sounds on the right, no wheezes, rales or rhonchi, normal respiratory effort. Cardiovascular system: normal S1/S2, RRR, no JVD, murmurs, rubs, gallops, no pedal edema.   Gastrointestinal system: soft, non-tender, protuberant abdomen Central nervous system: alert and oriented x4. no gross focal neurologic deficits, normal  speech Extremities: moves all, no edema, normal tone Skin: dry, intact, normal temperature Psychiatry: normal mood, congruent affect, abnormal judgement and insight   Data Reviewed: I have personally reviewed following labs and imaging studies  CBC: Recent Labs  Lab 05/27/19 1200 05/28/19 0345 05/29/19 0428 05/30/19 0446 05/31/19 0038 06/01/19 0214  WBC 34.2* 32.0* 29.6* 25.9* 34.8* 33.0*  NEUTROABS 24.6*  --   --   --   --  25.5*  HGB 13.2 12.3* 11.4* 11.1* 11.3* 10.6*  HCT 40.3 35.6* 34.1* 33.8* 34.2* 31.7*  MCV 89.2 87.7 87.7 89.2 89.8 88.3  PLT 258 213 208 250 321 062   Basic Metabolic Panel: Recent Labs  Lab 05/29/19 0428 05/30/19 0446 05/30/19 1347  05/31/19 0038 05/31/19 0844 05/31/19 1257 05/31/19 2118 06/01/19 0214 06/01/19 0840  NA 131* 132* 131*  --  130*  --   --   --  133*  --   K 4.9 5.8* 5.7*   < > 5.6* 6.1* 5.7* 5.8* 5.5* 5.7*  CL 104 104 104  --  101  --   --   --  105  --   CO2 15* 13* 13*  --  14*  --   --   --  17*  --   GLUCOSE 202* 216* 316*  --  385*  --   --   --  150*  --   BUN 46* 41* 46*  --  49*  --   --   --  55*  --   CREATININE 1.71* 1.70* 1.61*  --  1.80*  --   --   --  1.69*  --   CALCIUM 7.8* 8.1* 8.1*  --  8.3*  --   --   --  8.7*  --   MG  --   --   --   --   --   --   --   --  2.5*  --   PHOS  --   --   --   --   --   --   --   --  2.8  --    < > = values in this interval not displayed.   GFR: Estimated Creatinine Clearance: 43.9 mL/min (A) (by C-G formula based on SCr of 1.69 mg/dL (H)). Liver Function Tests: Recent Labs  Lab 05/27/19 1200 06/01/19 0214  AST 15  --   ALT 16  --   ALKPHOS 99  --   BILITOT 0.7  --   PROT 7.1  --   ALBUMIN 2.5* 2.2*   Recent Labs  Lab 05/27/19 1200  LIPASE 30   No results for input(s): AMMONIA in the last 168 hours. Coagulation Profile: No results for input(s): INR, PROTIME in the last 168 hours. Cardiac Enzymes: No results for input(s): CKTOTAL, CKMB, CKMBINDEX, TROPONINI in the  last 168 hours. BNP (last 3 results) No results for input(s): PROBNP in the last 8760 hours. HbA1C: No results for input(s): HGBA1C in the last 72 hours. CBG: Recent Labs  Lab 05/31/19 1208 05/31/19 1656 05/31/19 2037 06/01/19 0717 06/01/19 1204  GLUCAP 422* 413* 335* 214* 215*   Lipid Profile: No results for input(s): CHOL, HDL, LDLCALC, TRIG, CHOLHDL, LDLDIRECT in the last 72 hours. Thyroid Function Tests: No results for input(s): TSH, T4TOTAL, FREET4, T3FREE, THYROIDAB in the last 72 hours. Anemia Panel: No results for input(s): VITAMINB12, FOLATE, FERRITIN, TIBC, IRON, RETICCTPCT in the last 72 hours. Sepsis Labs: Recent Labs  Lab 05/27/19 1205 05/27/19 1414 05/29/19 0428 05/30/19 0446 05/31/19 0038  PROCALCITON  --   --  0.55 0.41 0.38  LATICACIDVEN 1.8 1.5  --   --   --     Recent Results (from the past 240 hour(s))  SARS CORONAVIRUS 2 (TAT 6-24 HRS) Nasopharyngeal Nasopharyngeal Swab     Status: None   Collection Time: 05/27/19 11:35 AM   Specimen: Nasopharyngeal Swab  Result Value Ref Range Status   SARS Coronavirus 2 NEGATIVE NEGATIVE Final    Comment: (NOTE) SARS-CoV-2 target nucleic acids are NOT DETECTED. The SARS-CoV-2 RNA is generally detectable in upper and lower respiratory specimens during the acute phase of infection. Negative results do not preclude SARS-CoV-2 infection, do not rule out co-infections with other pathogens, and should not be used as the sole basis for treatment or other patient management decisions. Negative results must be combined with clinical observations, patient history, and epidemiological information. The expected result is Negative. Fact Sheet for Patients: SugarRoll.be Fact Sheet for Healthcare Providers: https://www.woods-mathews.com/ This test is not yet approved or cleared by the [place] FDA and  has been authorized for detection and/or diagnosis of SARS-CoV-2 by FDA  under an Emergency Use Authorization (EUA). This EUA will remain  in effect (meaning this test can be used) for the duration of the COVID-19 declaration under Section 56 4(b)(1) of the Act, 21 U.S.C. section 360bbb-3(b)(1), unless the authorization is terminated or revoked sooner. Performed  at [facility], [place] [address] [place], [place] [address]   Urine culture     Status: Abnormal   Collection Time: 05/27/19 11:37 AM   Specimen: Urine, Random  Result Value Ref Range Status   Specimen Description URINE, RANDOM  Final   Special Requests   Final    NONE Performed at [facility], [place] [address], [place], [place] [address]    Culture >=100,000 COLONIES/mL YEAST (A)  Final   Report Status 05/28/2019 FINAL  Final  Blood culture (routine x 2)     Status: None   Collection Time: 05/27/19 12:00 PM   Specimen: BLOOD  Result Value Ref Range Status   Specimen Description BLOOD RIGHT ANTECUBITAL  Final   Special Requests   Final    BOTTLES DRAWN AEROBIC AND ANAEROBIC Blood Culture adequate volume   Culture   Final    NO GROWTH 5 DAYS Performed at [facility], [place] [address] [place], [place] [address]    Report Status 06/01/2019 FINAL  Final  Blood culture (routine x 2)     Status: None   Collection Time: 05/27/19  1:18 PM   Specimen: BLOOD RIGHT HAND  Result Value Ref Range Status   Specimen Description BLOOD RIGHT HAND  Final   Special Requests   Final    BOTTLES DRAWN AEROBIC AND ANAEROBIC Blood Culture results may not be optimal due to an inadequate volume of blood received in culture bottles   Culture   Final    NO GROWTH 5 DAYS Performed at [facility], [place] [address], [place], [place] [address]    Report Status 06/01/2019 FINAL  Final  Gram stain     Status: None   Collection Time: 05/27/19  5:10 PM   Specimen: PATH Cytology Pleural fluid  Result Value Ref Range Status   Specimen Description FLUID PLEURAL  Final   Special Requests NONE  Final    Gram Stain   Final    RARE WBC PRESENT, PREDOMINANTLY PMN NO ORGANISMS SEEN Performed at [facility], [place] [address] [place], [place] [address]    Report Status 05/28/2019 FINAL  Final  Culture, body fluid-bottle     Status: None   Collection Time: 05/27/19  5:10 PM   Specimen: Fluid  Result Value Ref Range Status   Specimen Description FLUID PLEURAL  Final   Special Requests BOTTLES DRAWN AEROBIC AND ANAEROBIC  Final   Culture   Final    NO GROWTH 5 DAYS Performed at [facility], [place] [address] [place],  [address]    Report Status 06/01/2019 FINAL  Final         Radiology Studies: US Renal  Result Date: 05/31/2019 CLINICAL DATA:  Acute kidney injury. EXAM: RENAL / URINARY TRACT ULTRASOUND COMPLETE COMPARISON:  CT 05/08/2019 FINDINGS: Right Kidney: Renal measurements: 10.7 x 4.6 x 4.4 cm = volume: 112.4 mL. Thinning of renal parenchyma with increased renal echogenicity. No hydronephrosis. Multiple (less than 10) cysts within the kidney, all less than 1 cm. No obvious intra or perirenal fluid collection. Left Kidney: Renal measurements: 14.0 x 7.9 x 6.0 cm = volume: 349 mL. Slight increased renal echogenicity. No hydronephrosis. Multiple (less than 10) cysts within the left kidney. Largest cyst superior medially measures 4.9 cm greatest dimension. No obvious intrarenal or perirenal fluid collection. Bladder: Appears normal for degree of bladder distention. Both ureteral jets are visualized. Other: None. IMPRESSION: 1. No  obstructive uropathy. 2. Increased bilateral renal echogenicity with mild thinning of the right renal parenchyma suggesting chronic medical renal disease. 3. Bilateral renal cysts. Electronically Signed   By: Keith Rake M.D.   On: 05/31/2019 23:46   Dg Chest Port 1 View  Result Date: 05/31/2019 CLINICAL DATA:  Status post thoracentesis. EXAM: PORTABLE CHEST 1 VIEW COMPARISON:  May 30, 2019. FINDINGS: Stable cardiomediastinal  silhouette. Right pleural effusion appears to be smaller. Right infrahilar atelectasis is noted. Left lung is clear. No pneumothorax is noted. Bony thorax is unremarkable. IMPRESSION: No pneumothorax status post right-sided thoracentesis. Right pleural effusion appears smaller. Electronically Signed   By: Marijo Conception M.D.   On: 05/31/2019 14:15   Dg Chest Port 1 View  Result Date: 05/30/2019 CLINICAL DATA:  Dyspnea, pleural effusion EXAM: PORTABLE CHEST 1 VIEW COMPARISON:  05/28/2019 FINDINGS: Slight interval increase in a moderate, layering right pleural effusion with associated atelectasis or consolidation. The left lung is normally aerated. Cardiomegaly. IMPRESSION: 1. Slight interval increase in a moderate, layering right pleural effusion with associated atelectasis or consolidation. 2.  The left lung is normally aerated. 3.  Cardiomegaly. Electronically Signed   By: Eddie Candle M.D.   On: 05/30/2019 14:58        Scheduled Meds:  aspirin EC  81 mg Oral Daily   atorvastatin  80 mg Oral q1800   dexamethasone (DECADRON) injection  4 mg Intravenous Q24H   diltiazem  30 mg Oral Q6H   feeding supplement (ENSURE ENLIVE)  237 mL Oral BID BM   guaiFENesin  600 mg Oral BID   haloperidol lactate  2 mg Intramuscular Once   insulin aspart  0-15 Units Subcutaneous TID WC   insulin aspart  0-5 Units Subcutaneous QHS   insulin glargine  10 Units Subcutaneous BID   pantoprazole  40 mg Oral Daily   sodium bicarbonate  1,300 mg Oral BID   tamsulosin  0.4 mg Oral Daily   Continuous Infusions:  ceFEPime (MAXIPIME) IV Stopped (06/01/19 0106)     LOS: 5 days    Time spent: 30 minutes    Ezekiel Slocumb, DO [facility] Pager: [phone]  If 7PM-7AM, please contact night-coverage [email or web address] Password [facility] 06/01/2019, 12:49 PM

## 2019-06-01 NOTE — Anesthesia Procedure Notes (Signed)
Procedure Name: MAC Date/Time: 06/01/2019 2:19 PM Performed by: Griffin Dakin, CRNA Pre-anesthesia Checklist: Patient identified, Emergency Drugs available, Suction available, Patient being monitored and Timeout performed Patient Re-evaluated:Patient Re-evaluated prior to induction Oxygen Delivery Method: Simple face mask Placement Confirmation: positive ETCO2 Dental Injury: Teeth and Oropharynx as per pre-operative assessment

## 2019-06-01 NOTE — Anesthesia Procedure Notes (Signed)
Procedure Name: LMA Insertion Date/Time: 06/01/2019 3:18 PM Performed by: Leonor Liv, CRNA Pre-anesthesia Checklist: Patient identified, Emergency Drugs available, Suction available and Patient being monitored Patient Re-evaluated:Patient Re-evaluated prior to induction Oxygen Delivery Method: Circle System Utilized Preoxygenation: Pre-oxygenation with 100% oxygen Induction Type: IV induction Ventilation: Mask ventilation without difficulty LMA: LMA inserted LMA Size: 4.0 Number of attempts: 1 Placement Confirmation: positive ETCO2 Tube secured with: Tape Dental Injury: Teeth and Oropharynx as per pre-operative assessment

## 2019-06-01 NOTE — Progress Notes (Signed)
Wife in [place] with patient per Dr. Carlis Abbott.

## 2019-06-01 NOTE — Care Plan (Signed)
Brian French was seen with his wife in the PACU.  He was confused, but otherwise awake and doing well.  He should follow-up in clinic with myself or one of the other providers, hopefully on Thursday of this week.  They will call to confirm appointment time with his wife tomorrow.  Pleurx instructions: Okay to shower with occlusive dressing in place.  Needs to avoid submerging in water (no baths, hot tubs, swimming pools). I spoke to Dr. Arbutus Ped who will touch base with case management to initiate him having home health and Pleurx bottles available.  Plan for 1 L Pleurx bottles for daily drainage for 6 months. Pleurx bottle training with home health and in clinic later this week.  Please call with questions.  Julian Hy, DO 06/01/19 7:11 PM [facility]

## 2019-06-02 ENCOUNTER — Encounter (HOSPITAL_COMMUNITY): Payer: Self-pay | Admitting: Critical Care Medicine

## 2019-06-02 ENCOUNTER — Telehealth: Payer: Self-pay | Admitting: *Deleted

## 2019-06-02 ENCOUNTER — Encounter (HOSPITAL_COMMUNITY): Payer: Self-pay | Admitting: Hematology and Oncology

## 2019-06-02 DIAGNOSIS — I4891 Unspecified atrial fibrillation: Secondary | ICD-10-CM

## 2019-06-02 LAB — CBC
HCT: 34 % — ABNORMAL LOW (ref 39.0–52.0)
Hemoglobin: 11.5 g/dL — ABNORMAL LOW (ref 13.0–17.0)
MCH: 29.6 pg (ref 26.0–34.0)
MCHC: 33.8 g/dL (ref 30.0–36.0)
MCV: 87.4 fL (ref 80.0–100.0)
Platelets: 279 10*3/uL (ref 150–400)
RBC: 3.89 MIL/uL — ABNORMAL LOW (ref 4.22–5.81)
RDW: 15.1 % (ref 11.5–15.5)
WBC: 43.6 10*3/uL — ABNORMAL HIGH (ref 4.0–10.5)
nRBC: 0 % (ref 0.0–0.2)

## 2019-06-02 LAB — BASIC METABOLIC PANEL
Anion gap: 11 (ref 5–15)
BUN: 47 mg/dL — ABNORMAL HIGH (ref 8–23)
CO2: 20 mmol/L — ABNORMAL LOW (ref 22–32)
Calcium: 9 mg/dL (ref 8.9–10.3)
Chloride: 105 mmol/L (ref 98–111)
Creatinine, Ser: 1.57 mg/dL — ABNORMAL HIGH (ref 0.61–1.24)
GFR calc Af Amer: 49 mL/min — ABNORMAL LOW (ref >60)
GFR calc non Af Amer: 42 mL/min — ABNORMAL LOW (ref >60)
Glucose, Bld: 205 mg/dL — ABNORMAL HIGH (ref 70–99)
Potassium: 5.3 mmol/L — ABNORMAL HIGH (ref 3.5–5.1)
Sodium: 136 mmol/L (ref 135–145)

## 2019-06-02 LAB — GLUCOSE, CAPILLARY
Glucose-Capillary: 180 mg/dL — ABNORMAL HIGH (ref 70–99)
Glucose-Capillary: 257 mg/dL — ABNORMAL HIGH (ref 70–99)
Glucose-Capillary: 415 mg/dL — ABNORMAL HIGH (ref 70–99)
Glucose-Capillary: 155 mg/dL — ABNORMAL HIGH (ref 70–99)

## 2019-06-02 LAB — PROTEIN / CREATININE RATIO, URINE
Creatinine, Urine: 55.75 mg/dL
Protein Creatinine Ratio: 0.81 mg/mg{Cre} — ABNORMAL HIGH (ref 0.00–0.15)
Total Protein, Urine: 45 mg/dL

## 2019-06-02 LAB — POTASSIUM: Potassium: 5.3 mmol/L — ABNORMAL HIGH (ref 3.5–5.1)

## 2019-06-02 LAB — CYTOLOGY - NON PAP

## 2019-06-02 MED ORDER — ACETAMINOPHEN 325 MG PO TABS: 650.0000 mg | ORAL_TABLET | Freq: Four times a day (QID) | ORAL | Status: DC | PRN

## 2019-06-02 MED ORDER — SODIUM ZIRCONIUM CYCLOSILICATE 10 G PO PACK
10.0000 g | PACK | Freq: Two times a day (BID) | ORAL | Status: AC
  Administered 2019-06-02 (×2): 10 g via ORAL
  Filled 2019-06-02 (×2): qty 1

## 2019-06-02 MED ORDER — INSULIN GLARGINE 100 UNIT/ML ~~LOC~~ SOLN
15.0000 [IU] | Freq: Two times a day (BID) | SUBCUTANEOUS | Status: DC
  Administered 2019-06-02 – 2019-06-04 (×4): 15 [IU] via SUBCUTANEOUS
  Filled 2019-06-02 (×5): qty 0.15

## 2019-06-02 MED ORDER — HYDROCODONE-ACETAMINOPHEN 5-325 MG PO TABS
1.0000 | ORAL_TABLET | Freq: Two times a day (BID) | ORAL | Status: DC | PRN
  Administered 2019-06-03 (×2): 1 via ORAL
  Filled 2019-06-02 (×2): qty 1

## 2019-06-02 NOTE — Progress Notes (Signed)
NAME:  Brian French., MRN:  [id number], DOB:  Nov 29, 1940, LOS: 6 ADMISSION DATE:  05/27/2019, CONSULTATION DATE:  11/13 REFERRING MD:  Dr. Teryl Lucy, CHIEF COMPLAINT:  SOB, cough   Brief History   78 y/o M admitted 11/12 with worsening dyspnea and cough.   History of present illness   79 y/o M who presented to [facility] on 11/12 with reports of decreased appetite, dizziness, abdominal pain and shortness of breath.    He was admitted to the hospital 10/15-10/19 for acute pyelonephritis & suspected right sided PNA.   During that admission, he underwent a thoracentesis per IR (10/16) with malignant cells in pleural fluid. He is followed by the [place] and is currently under evaluation by [facility]C for possible cancer (unknown primary / family could not state) but no definitive diagnosis has been made.  His wife indicates he has been having trouble with his memory for approximately 6 months and was started on Namenda.  She states he has been "saying things that are off".  She notes approximately 20lbs in the last few months.   ER evaluation found him to be in [facility], tachypneic requiring O2.  CXR showed concerning for enlarged right pleural effusion.  Initial labs notable for hyponatremia (126), hyperkalemia (6.1), CO2 17, BUN 50 / Sr Cr 2.33 (up from 1.85 10/24), albumin 2.5, WBC 34.2 and platelets 258.   Subsequent CT imaging of the chest demonstrated an increase in the right pleural effusion with compressive atelectasis, persistent enlarged subcarinal lymph nodes, RUL nodular density.   PCCM consulted for evaluation of malignant effusion.   Past Medical History  DM  HLD - lipitor  HTN AF - on eliquis, ASA, cardizem Prostate Cancer - self caths  Former [facility] Events   11/12 Admit   Consults:  PCCM   Procedures:  05/27/2019 thoracentesis right with one-point liters per interventional radiology 05/31/2019 thoracentesis left with 700 cc of murky fluid removed 06/01/2019 EBUS  with right Pleurx catheter insertion Significant Diagnostic Tests:  ECHO 10/16 >> LVEF 50-55%, mild LVH, impaired diastolic filling, global RV systolic function normal, LA/RA size normal, trivial pericardial effusion, trace MV regurgitation  Micro Data:  COVID 11/12 >> negative  Pleural Fluid GS 11/12 >. Negative  UC 11/12 >> 100k yeast  BCx2 11/12 >> pending 06/02/2019 Pleural Fluid Culture 11/12 >> pending 06/02/2019  Antimicrobials:  Cefepime 11/12 >>  Vanco 11/12 >> off  Interim history/subjective:  Pt denies acute complaints.  States he can't remember if he has had any work up at the [place] for cancer.   Objective   Blood pressure 127/80, pulse (!) 58, temperature 97.9 F (36.6 C), temperature source Oral, resp. rate 18, height 5\' 11"  (1.803 m), weight 99.1 kg, SpO2 95 %.        Intake/Output Summary (Last 24 hours) at 06/02/2019 1058 Last data filed at 06/02/2019 0900 Gross per 24 hour  Intake 931.89 ml  Output 3210 ml  Net -2278.11 ml   Filed Weights   05/31/19 0424 06/01/19 0254 06/01/19 1354  Weight: 99.2 kg 99.1 kg 99.1 kg    Examination: General: Ill-appearing male who is not orientated to place or time.  He looks to his wife anytime he is asked the question for the appropriate answer which he cannot provide. HEENT: No JVD or lymphadenopathy is appreciated Neuro: Functionally moves all extremities follows commands.  He is not orientated to time or place. CV: Heart sounds are distant PULM: Decreased breath sounds in the bases  GI: soft, bsx4 active  Extremities: warm/dry, 1+ edema  Skin: no rashes or lesions    Resolved Hospital Problem list      Assessment & Plan:   Malignant Pleural Effusion  Former Smoker  Trivial Pericardial Effusion  Weight Loss  Hyponatremia  Recent Labs  Lab 05/31/19 0038 06/01/19 0214 06/02/19 0742  NA 130* 133* 136    Former smoker with weight loss, confusion, hyponatremia, trivial pericardial effusion and proven  malignant pleural effusion.  Concerning for stage IV lung cancer.    Status post thoracentesis per Dr. Tamala Julian Status post EBUS and Pleurx catheter insertion per Dr. Carlis Abbott Pleurx to be drained 1000 cc daily Home health nurses to teach patient and wife how to utilize Pleurx catheter in the drains. Follow-up appointment with Dr. Noemi Chapel on Monday, 23 November at 10:45 AM. Further evaluation and treatment will be performed by Dr. Noemi Chapel in the office at prearranged date.  AKI Lab Results  Component Value Date   CREATININE 1.57 (H) 06/02/2019   CREATININE 1.69 (H) 06/01/2019   CREATININE 1.80 (H) 05/31/2019    Per primary  AF  Bradycardia to the 30's Per primary   Best practice:  Diet: Per primary  DVT prophylaxis: hold apixaban, change to heparin sq GI prophylaxis: PPI  Glucose control: SSI  Mobility: as tolerated  Code Status: Full Code  Family Communication: 06/02/2019 wife and patient updated at bedside both of them showed little insight into the ongoing health issues. Disposition: Per Primary  Labs   CBC: Recent Labs  Lab 05/27/19 1200  05/29/19 0428 05/30/19 0446 05/31/19 0038 06/01/19 0214 06/02/19 0742  WBC 34.2*   < > 29.6* 25.9* 34.8* 33.0* 43.6*  NEUTROABS 24.6*  --   --   --   --  25.5*  --   HGB 13.2   < > 11.4* 11.1* 11.3* 10.6* 11.5*  HCT 40.3   < > 34.1* 33.8* 34.2* 31.7* 34.0*  MCV 89.2   < > 87.7 89.2 89.8 88.3 87.4  PLT 258   < > 208 250 321 276 279   < > = values in this interval not displayed.    Basic Metabolic Panel: Recent Labs  Lab 05/30/19 0446 05/30/19 1347  05/31/19 0038  06/01/19 0214  06/01/19 1300 06/01/19 1810 06/01/19 2147 06/02/19 0043 06/02/19 0742  NA 132* 131*  --  130*  --  133*  --   --   --   --   --  136  K 5.8* 5.7*   < > 5.6*   < > 5.5*   < > 4.9 5.8* 5.1 5.3* 5.3*  CL 104 104  --  101  --  105  --   --   --   --   --  105  CO2 13* 13*  --  14*  --  17*  --   --   --   --   --  20*  GLUCOSE 216* 316*   --  385*  --  150*  --   --   --   --   --  205*  BUN 41* 46*  --  49*  --  55*  --   --   --   --   --  47*  CREATININE 1.70* 1.61*  --  1.80*  --  1.69*  --   --   --   --   --  1.57*  CALCIUM 8.1* 8.1*  --  8.3*  --  8.7*  --   --   --   --   --  9.0  MG  --   --   --   --   --  2.5*  --   --   --   --   --   --   PHOS  --   --   --   --   --  2.8  --   --   --   --   --   --    < > = values in this interval not displayed.   GFR: Estimated Creatinine Clearance: 47.3 mL/min (A) (by C-G formula based on SCr of 1.57 mg/dL (H)). Recent Labs  Lab 05/27/19 1205 05/27/19 1414  05/29/19 0428 05/30/19 0446 05/31/19 0038 06/01/19 0214 06/02/19 0742  PROCALCITON  --   --   --  0.55 0.41 0.38  --   --   WBC  --   --    < > 29.6* 25.9* 34.8* 33.0* 43.6*  LATICACIDVEN 1.8 1.5  --   --   --   --   --   --    < > = values in this interval not displayed.    Liver Function Tests: Recent Labs  Lab 05/27/19 1200 06/01/19 0214  AST 15  --   ALT 16  --   ALKPHOS 99  --   BILITOT 0.7  --   PROT 7.1  --   ALBUMIN 2.5* 2.2*   Recent Labs  Lab 05/27/19 1200  LIPASE 30   No results for input(s): AMMONIA in the last 168 hours.  ABG No results found for: PHART, PCO2ART, PO2ART, HCO3, TCO2, ACIDBASEDEF, O2SAT   Coagulation Profile: No results for input(s): INR, PROTIME in the last 168 hours.  Cardiac Enzymes: No results for input(s): CKTOTAL, CKMB, CKMBINDEX, TROPONINI in the last 168 hours.  HbA1C: Hgb A1c MFr Bld  Date/Time Value Ref Range Status  04/30/2019 05:43 AM 9.8 (H) 4.8 - 5.6 % Final    Comment:    (NOTE) Pre diabetes:          5.7%-6.4% Diabetes:              >6.4% Glycemic control for   <7.0% adults with diabetes     CBG: Recent Labs  Lab 06/01/19 1724 06/01/19 1832 06/01/19 2123 06/01/19 2302 06/02/19 [place] PCCM

## 2019-06-02 NOTE — Evaluation (Signed)
Physical Therapy Evaluation Patient Details Name: Brian French. MRN: [id number] DOB: [birth year] Today's Date: 06/02/2019   History of Present Illness  78 y/o M who presented to [facility] on 11/12 with reports of decreased appetite, dizziness, abdominal pain & SOB. Recent admission 10/15-10/19 for acute pyelonephritis & suspected right sided PNA.  Found to be in [facility], tachypneic requiring O2.  CXR showed concerning for enlarged right pleural effusion.  Now s/p thoracentesis on 11/16 and bronchoscopy 11/17.  MRI positive for hemorrhagic lesion R frontal suspicious for metastatic disease.  Clinical Impression  Patient presents with decreased mobility today needing minguard to S level assistance for ambulation with RW in hallway.  Needed little more assist up from supine.  Wife present and verbally redirecting patient to improve participation.  He will benefit from skilled PT in the acute setting and follow up HHPT at d/c.      Follow Up Recommendations Home health PT;Supervision/Assistance - 24 hour    Equipment Recommendations  Rolling walker with 5" wheels;Other (comment)(tub bench)    Recommendations for Other Services       Precautions / Restrictions Precautions Precautions: Fall      Mobility  Bed Mobility Overal bed mobility: Needs Assistance Bed Mobility: Supine to Sit     Supine to sit: Mod assist     General bed mobility comments: lifting assist for trunk  Transfers Overall transfer level: Needs assistance Equipment used: Rolling walker (2 wheeled) Transfers: Sit to/from Stand Sit to Stand: Min guard;From elevated surface         General transfer comment: for balance with cues for hand placement, elevated bed due to bed at home is higher  Ambulation/Gait Ambulation/Gait assistance: Supervision;Min guard Gait Distance (Feet): 150 Feet Assistive device: Rolling walker (2 wheeled) Gait Pattern/deviations: Step-through pattern;Decreased stride length      General Gait Details: at times HR up to 140's with ambulation, but possibly artifact due to erratic wave form on monitor, 100's-90's once settle in room with regular wave form  Stairs            Wheelchair Mobility    Modified Rankin (Stroke Patients Only)       Balance Overall balance assessment: Needs assistance   Sitting balance-Leahy Scale: Good       Standing balance-Leahy Scale: Fair Standing balance comment: could likely stand without UE support, but RW for ambulation; was able to back up to chair with S and cues with RW                             Pertinent Vitals/Pain Pain Assessment: No/denies pain    Home Living Family/patient expects to be discharged to:: Private residence   Available Help at Discharge: Family Type of Home: House Home Access: Level entry     Home Layout: One level Home Equipment: None      Prior Function Level of Independence: Independent         Comments: more trouble lately needing wife to assist     Hand Dominance   Dominant Hand: Right    Extremity/Trunk Assessment   Upper Extremity Assessment Upper Extremity Assessment: Generalized weakness    Lower Extremity Assessment Lower Extremity Assessment: Generalized weakness       Communication   Communication: HOH  Cognition Arousal/Alertness: Awake/alert Behavior During Therapy: WFL for tasks assessed/performed Overall Cognitive Status: History of cognitive impairments - at baseline  General Comments: wife in the room enjoying friendly joking/arguing, but she redirected him to allow participation      General Comments      Exercises     Assessment/Plan    PT Assessment Patient needs continued PT services  PT Problem List Decreased strength;Decreased activity tolerance;Decreased mobility;Decreased cognition;Decreased safety awareness;Decreased knowledge of use of DME;Decreased balance       PT  Treatment Interventions DME instruction;Therapeutic activities;Balance training;Cognitive remediation;Patient/family education;Therapeutic exercise;Gait training;Functional mobility training    PT Goals (Current goals can be found in the Care Plan section)  Acute Rehab PT Goals Patient Stated Goal: to go home PT Goal Formulation: With patient/family Time For Goal Achievement: 06/16/19 Potential to Achieve Goals: Good    Frequency Min 3X/week   Barriers to discharge        Co-evaluation               AM-PAC PT "6 Clicks" Mobility  Outcome Measure Help needed turning from your back to your side while in a flat bed without using bedrails?: A Little Help needed moving from lying on your back to sitting on the side of a flat bed without using bedrails?: A Lot Help needed moving to and from a bed to a chair (including a wheelchair)?: A Little Help needed standing up from a chair using your arms (e.g., wheelchair or bedside chair)?: A Little Help needed to walk in hospital room?: A Little Help needed climbing 3-5 steps with a railing? : A Little 6 Click Score: 17    End of Session   Activity Tolerance: Patient tolerated treatment well Patient left: in chair;with call bell/phone within reach;with family/visitor present   PT Visit Diagnosis: Other abnormalities of gait and mobility (R26.89);Muscle weakness (generalized) (M62.81)    Time: 4259-5638 PT Time Calculation (min) (ACUTE ONLY): 25 min   Charges:   PT Evaluation $PT Eval Moderate Complexity: 1 Mod PT Treatments $Gait Training: 8-22 mins        Magda Kiel, PT Acute Rehabilitation Services [phone] 06/02/2019   Reginia Naas 06/02/2019, 2:23 PM

## 2019-06-02 NOTE — Consult Note (Signed)
   [facility] CM Inpatient Consult   06/02/2019  Jemmie Rhinehart. [birth year] [id number]  Patient screened for extreme high risk score for unplanned readmission score  and for less than 30 day re-hospitalizations to check if potential [facility] services as in the Medicare Next Gen roster list.  However, the patient's   medical record reveals patient is Primary Care Provider is the [facility] in [place] noted.   Plan: No needs from [place] [facility] following as the primary care provider is the [facility], not a [facility] provider.    For questions contact:   Natividad Brood, RN BSN [facility] Liaison  [phone] business mobile phone Toll free office [phone]  Fax number: [phone] [email or web address]

## 2019-06-02 NOTE — Care Management (Addendum)
CM requested unit to order 10 canister box of pleurx catheters for pt.    CM followed up with wife regarding HH choice - [place] chosen - [facility] given referral.  Per [facility] VA benefit will have to be utilized due to restrictions with purchasing pleurx drains per medicare beneift. Wife in agreement to utilize [place] benefit - pt not oriented this am.  [facility] request that pt be sent home with pleurx canisters.   [facility] will send for [facility] authorization for [facility] and plurex catheters.  Wife is aware that she will need to drain catheter on the days HH does not provide services.    CM text paged PCCM to request signature on [employer] form  Update:  VA in contact with [facility] regarding authorization for both HHRN/HHPT.  Wife given choice of [place] agency for DME - wife chose [place] and is aware that it can take the [place] 48 hours to send dme to the home.  VA aware of DME request - CM will fax DME orders once received  Documentation Faxed to [place] 1.  HH order (RN and PT) 2.  Care Manager Consult for Plurex drainage catheters 3.  PT note from 11/18

## 2019-06-02 NOTE — Telephone Encounter (Signed)
-----   Message from Julian Hy, DO sent at 06/01/2019  7:06 PM EST ----- Regarding: follow up Thursday? Can we make this gentleman an appointment on Thursday with me please? He had a pleurX placed today and I want to check the site, hopefully drain him again in the office if we can, and do the training with the wife (he was a little out of it in PACU and wouldn't cooperate). I told his wife we would set that up. He can also see Ardeth Sportsman, Leroy Sea, or Dan for a pleurX follow up no Thursday or Friday if I don't have openings.  Thanks!  Arby Barrette

## 2019-06-02 NOTE — Telephone Encounter (Signed)
atc patient unable to reach left message to call back

## 2019-06-02 NOTE — Progress Notes (Signed)
PROGRESS NOTE   Brian French.  RJJ:[id number]    DOB: [birth year]    DOA: 05/27/2019  PCP: [facility]   I have briefly reviewed patients previous medical records in [facility].  Chief Complaint  Patient presents with  . Shortness of Breath    Brief Narrative:  78 year old male with PMH of DM2, HTN, A. fib on Eliquis, suspected dementia recently hospitalized 10/15-10/19 for acute pyelonephritis and suspected pneumonia.  During that admission he underwent thoracentesis by IR (10/16) which showed malignant cells in the pleural fluid.  He is followed by the [facility] and is currently under evaluation by oncology for possible cancer (unknown primary/family unable to state) but no definitive diagnosis has been made.  Presented with dyspnea and cough.  Chest x-ray concerning for masslike opacity in the right perihilar area with right pleural effusion.  PCCM consulted.  S/p right Pleurx catheter placement by PCCM on 11/17.  Hospital course complicated by A. fib with mild RVR and delirium.   Assessment & Plan:   Principal Problem:   Pneumonia due to infectious agent Active Problems:   Atrial fibrillation with RVR (HCC)   Type 2 diabetes mellitus with hyperlipidemia (HCC)   Malignant pleural effusion   AKI (acute kidney injury) (HCC)   Hyperkalemia   Hilar mass   Hyponatremia   Metabolic acidosis, normal anion gap (NAG)   AF (paroxysmal atrial fibrillation) (HCC)   BPH (benign prostatic hyperplasia)   AAA (abdominal aortic aneurysm) (HCC)   Frontal mass of brain   Vasogenic brain edema (HCC)   Malignant right pleural effusion/masslike opacity in the right perihilar region  S/p thoracentesis 11/12 by IR followed by 11/16 by PCCM.  S/p EBUS with right Pleurx catheter insertion by PCCM on 11/17.  Pulmonology follow-up appreciated.  Recommend Pleurx to be drained 1000 mL daily.  Home health nurses to teach patient and wife regarding management of Pleurx catheter.   Follow-up with Dr. Noemi Chapel, PCCM on 11/23 at 10:45 AM.  Remains on empiric cefepime, blood cultures x2 and pleural fluid cultures negative, final report.  Plan to discuss with pulmonology regarding duration and choice of antibiotics.  On reviewing cytology reports, ascites cytology on 10/16 was positive for malignant cells.  Pleural fluid cytology on 11/18 was negative for malignant cells.  Unsure why oncology not consulted thus far, will consult in a.m.  Acute respiratory failure with hypoxia  Suspected due to malignant pleural effusion and probable lung cancer.  Resolved.  Currently saturating in the high 90s on room air.  Right brain (frontal lobe) mass  Suspecting metastatic lesion with primary lung cancer.  Continue IV dexamethasone  Will consult oncology in a.m.  Acute metabolic encephalopathy/delirium  Likely multifactorial related to brain mass, steroids complicating underlying dementia.  Delirium precautions.  Acute kidney injury complicating CKD stage IIIb  Creatinine had peaked to 1.8, better to 1.5 today.  Renal ultrasound without hydronephrosis.  Avoid nephrotoxic medications.  Non-anion gap metabolic acidosis  Improved on sodium bicarbonate.  Bicarbonate up gradually from 13-20.  Hyperkalemia  Unclear etiology.?  Type IV RTA.  Continue sodium bicarbonate.  Added Lokelma 10 g x 2 doses today.  Low potassium diet.  Follow-up BMP in a.m.  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  Sodium has normalized.  Follow BMP.  Permanent A. fib with intermittent RVR  Currently on Cardizem 30 mg every 6 hourly.  Controlled in the 90s but intermittently RVR in the 120s.  If has  recurrent antibiotic then consider increasing Cardizem.  Anticoagulation currently on hold, consider resuming if no absolute contraindications.  Leukocytosis  Likely d/t steroids. Already on Cefepime.  Uncontrolled type II DM  Complicated  by steroids.  Worse this afternoon after he consumed a couple of nutritional drinks with meals.  Adjust insulins as needed and monitor closely.  Hyperlipidemia  Continue statins.  BPH  Reportedly self catheterizes at home.  Monitor for acute urinary retention.  3 cm infrarenal AAA Outpatient follow-up.  Body mass index is 30.47 kg/m.  Nutritional Status Nutrition Problem: Inadequate oral intake Etiology: poor appetite Signs/Symptoms: per patient/family report Interventions: Ensure Enlive (each supplement provides 350kcal and 20 grams of protein)  DVT prophylaxis: SCDs Code Status: Full Family Communication: None at bedside Disposition: To be determined   Consultants:  PCCM  Procedures:  As above  Antimicrobials:  IV cefepime   Subjective: Overnight events noted.  Confusion.  Need safety sitter.  Confusion better this morning.  Alert and oriented x2.  Still not fully cooperative denies complaints.  Denies dyspnea or pain.  Objective:  Vitals:   06/02/19 0725 06/02/19 0817 06/02/19 0900 06/02/19 1228  BP: 126/70 136/72 127/80 116/67  Pulse: 67 (!) 58  89  Resp:  18  (!) 22  Temp: 98 F (36.7 C)  97.9 F (36.6 C)   TempSrc: Oral  Oral   SpO2: 97% 95%  98%  Weight:      Height:        Examination:  General exam: Pleasant elderly male sitting up comfortably at edge of bed.  Respiratory system: Clear to auscultation except mildly reduced breath sounds in right base. Respiratory effort normal. Cardiovascular system: S1 & S2 heard,Irregularly irregular and mildly tachycardia. No JVD, murmurs, rubs, gallops or clicks. No pedal edema. Tele: personally reviewed and mostly CVR in 90's overnight and mild RVR in 120's this am. Gastrointestinal system: Abdomen is nondistended, soft and nontender. No organomegaly or masses felt. Normal bowel sounds heard. Central nervous system: Alert and oriented to person and place. No focal neurological deficits. Extremities:  Symmetric 5 x 5 power. Skin: No rashes, lesions or ulcers Psychiatry: Judgement and insight impaired. Mood & affect appropriate.     Data Reviewed: I have personally reviewed following labs and imaging studies   CBC: Recent Labs  Lab 05/27/19 1200  05/29/19 0428 05/30/19 0446 05/31/19 0038 06/01/19 0214 06/02/19 0742  WBC 34.2*   < > 29.6* 25.9* 34.8* 33.0* 43.6*  NEUTROABS 24.6*  --   --   --   --  25.5*  --   HGB 13.2   < > 11.4* 11.1* 11.3* 10.6* 11.5*  HCT 40.3   < > 34.1* 33.8* 34.2* 31.7* 34.0*  MCV 89.2   < > 87.7 89.2 89.8 88.3 87.4  PLT 258   < > 208 250 321 276 279   < > = values in this interval not displayed.    Basic Metabolic Panel: Recent Labs  Lab 05/30/19 0446 05/30/19 1347  05/31/19 0038  06/01/19 0214  06/01/19 1300 06/01/19 1810 06/01/19 2147 06/02/19 0043 06/02/19 0742  NA 132* 131*  --  130*  --  133*  --   --   --   --   --  136  K 5.8* 5.7*   < > 5.6*   < > 5.5*   < > 4.9 5.8* 5.1 5.3* 5.3*  CL 104 104  --  101  --  105  --   --   --   --   --  105  CO2 13* 13*  --  14*  --  17*  --   --   --   --   --  20*  GLUCOSE 216* 316*  --  385*  --  150*  --   --   --   --   --  205*  BUN 41* 46*  --  49*  --  55*  --   --   --   --   --  47*  CREATININE 1.70* 1.61*  --  1.80*  --  1.69*  --   --   --   --   --  1.57*  CALCIUM 8.1* 8.1*  --  8.3*  --  8.7*  --   --   --   --   --  9.0  MG  --   --   --   --   --  2.5*  --   --   --   --   --   --   PHOS  --   --   --   --   --  2.8  --   --   --   --   --   --    < > = values in this interval not displayed.    Liver Function Tests: Recent Labs  Lab 05/27/19 1200 06/01/19 0214  AST 15  --   ALT 16  --   ALKPHOS 99  --   BILITOT 0.7  --   PROT 7.1  --   ALBUMIN 2.5* 2.2*    CBG: Recent Labs  Lab 06/01/19 2123 06/01/19 2302 06/02/19 0725 06/02/19 1227 06/02/19 1634  GLUCAP 300* 184* 180* 257* 415*    Recent Results (from the past 240 hour(s))  SARS CORONAVIRUS 2 (TAT 6-24 HRS)  Nasopharyngeal Nasopharyngeal Swab     Status: None   Collection Time: 05/27/19 11:35 AM   Specimen: Nasopharyngeal Swab  Result Value Ref Range Status   SARS Coronavirus 2 NEGATIVE NEGATIVE Final    Comment: (NOTE) SARS-CoV-2 target nucleic acids are NOT DETECTED. The SARS-CoV-2 RNA is generally detectable in upper and lower respiratory specimens during the acute phase of infection. Negative results do not preclude SARS-CoV-2 infection, do not rule out co-infections with other pathogens, and should not be used as the sole basis for treatment or other patient management decisions. Negative results must be combined with clinical observations, patient history, and epidemiological information. The expected result is Negative. Fact Sheet for Patients: SugarRoll.be Fact Sheet for Healthcare Providers: https://www.woods-mathews.com/ This test is not yet approved or cleared by the [place] FDA and  has been authorized for detection and/or diagnosis of SARS-CoV-2 by FDA under an Emergency Use Authorization (EUA). This EUA will remain  in effect (meaning this test can be used) for the duration of the COVID-19 declaration under Section 56 4(b)(1) of the Act, 21 U.S.C. section 360bbb-3(b)(1), unless the authorization is terminated or revoked sooner. Performed at [facility], [place] [address], [place], [place] [address]   Urine culture     Status: Abnormal   Collection Time: 05/27/19 11:37 AM   Specimen: Urine, Random  Result Value Ref Range Status   Specimen Description URINE, RANDOM  Final   Special Requests   Final    NONE Performed at [facility], [place] [address] [place], [place] [address]    Culture >=100,000 COLONIES/mL YEAST (A)  Final   Report Status 05/28/2019 FINAL  Final  Blood culture (routine x 2)     Status: None   Collection Time: 05/27/19 12:00 PM   Specimen: BLOOD  Result Value Ref Range Status   Specimen  Description BLOOD RIGHT ANTECUBITAL  Final   Special Requests   Final    BOTTLES DRAWN AEROBIC AND ANAEROBIC Blood Culture adequate volume   Culture   Final    NO GROWTH 5 DAYS Performed at [facility], [address] [place], [place] [address]    Report Status 06/01/2019 FINAL  Final  Blood culture (routine x 2)     Status: None   Collection Time: 05/27/19  1:18 PM   Specimen: BLOOD RIGHT HAND  Result Value Ref Range Status   Specimen Description BLOOD RIGHT HAND  Final   Special Requests   Final    BOTTLES DRAWN AEROBIC AND ANAEROBIC Blood Culture results may not be optimal due to an inadequate volume of blood received in culture bottles   Culture   Final    NO GROWTH 5 DAYS Performed at [facility], [place] [address] [place], [place] [address]    Report Status 06/01/2019 FINAL  Final  Gram stain     Status: None   Collection Time: 05/27/19  5:10 PM   Specimen: PATH Cytology Pleural fluid  Result Value Ref Range Status   Specimen Description FLUID PLEURAL  Final   Special Requests NONE  Final   Gram Stain   Final    RARE WBC PRESENT, PREDOMINANTLY PMN NO ORGANISMS SEEN Performed at [facility], [place] [address], [place], [place] [address]    Report Status 05/28/2019 FINAL  Final  Culture, body fluid-bottle     Status: None   Collection Time: 05/27/19  5:10 PM   Specimen: Fluid  Result Value Ref Range Status   Specimen Description FLUID PLEURAL  Final   Special Requests BOTTLES DRAWN AEROBIC AND ANAEROBIC  Final   Culture   Final    NO GROWTH 5 DAYS Performed at [facility], [place] [address], [place], [place] [address]    Report Status 06/01/2019 FINAL  Final      Radiology Studies: US Renal  Result Date: 05/31/2019 CLINICAL DATA:  Acute kidney injury. EXAM: RENAL / URINARY TRACT ULTRASOUND COMPLETE COMPARISON:  CT 05/08/2019 FINDINGS: Right Kidney: Renal measurements: 10.7 x 4.6 x 4.4 cm = volume: 112.4 mL. Thinning of renal parenchyma with  increased renal echogenicity. No hydronephrosis. Multiple (less than 10) cysts within the kidney, all less than 1 cm. No obvious intra or perirenal fluid collection. Left Kidney: Renal measurements: 14.0 x 7.9 x 6.0 cm = volume: 349 mL. Slight increased renal echogenicity. No hydronephrosis. Multiple (less than 10) cysts within the left kidney. Largest cyst superior medially measures 4.9 cm greatest dimension. No obvious intrarenal or perirenal fluid collection. Bladder: Appears normal for degree of bladder distention. Both ureteral jets are visualized. Other: None. IMPRESSION: 1. No obstructive uropathy. 2. Increased bilateral renal echogenicity with mild thinning of the right renal parenchyma suggesting chronic medical renal disease. 3. Bilateral renal cysts. Electronically Signed   By: Keith Rake M.D.   On: 05/31/2019 23:46   Dg Chest Port 1 View  Result Date: 06/01/2019 CLINICAL DATA:  Chest tube EXAM: PORTABLE CHEST 1 VIEW COMPARISON:  05/31/2019 FINDINGS: Interval placement of PleurX on the right. No visible pneumothorax. Continued suspected layering right effusion. Right infrahilar atelectasis again noted. Left lung clear. Heart is upper limits normal  in size. No acute bony abnormality lateral left 6th rib fracture, chronic. IMPRESSION: Interval placement of right PleurX catheter. No pneumothorax. Probable continued layering right effusion with right infrahilar atelectasis. Electronically Signed   By: Rolm Baptise M.D.   On: 06/01/2019 19:18          Scheduled Meds: . aspirin EC  81 mg Oral Daily  . atorvastatin  80 mg Oral q1800  . dexamethasone (DECADRON) injection  4 mg Intravenous Q24H  . diltiazem  30 mg Oral Q6H  . feeding supplement (ENSURE ENLIVE)  237 mL Oral BID BM  . guaiFENesin  600 mg Oral BID  . haloperidol lactate  2 mg Intramuscular Once  . insulin aspart  0-15 Units Subcutaneous TID WC  . insulin aspart  0-5 Units Subcutaneous QHS  . insulin glargine  10 Units  Subcutaneous BID  . pantoprazole  40 mg Oral Daily  . sodium bicarbonate  1,300 mg Oral BID  . sodium zirconium cyclosilicate  10 g Oral BID  . tamsulosin  0.4 mg Oral Daily   Continuous Infusions: . ceFEPime (MAXIPIME) IV 2 g (06/02/19 1422)  . lactated ringers 10 mL/hr at 06/02/19 0249     LOS: 6 days     Vernell Leep, MD, [place], [facility]. [facility]  To contact the attending provider between 7A-7P or the covering provider during after hours 7P-7A, please log into the web site [email or web address] and access using universal [place] password for that web site. If you do not have the password, please call the hospital operator.  06/02/2019, 4:56 PM

## 2019-06-02 NOTE — Telephone Encounter (Signed)
That is fine, btu if he calls and is having issues before then bring him in with any of those people for Friday. Thanks!  LPC

## 2019-06-02 NOTE — Progress Notes (Addendum)
Pt pulled off leads and refused to allow Tech or RN to put them back on . He also removed his IV and is refusing all morning medications stating "he doesn't need them". Pt has been confused at baseline with delirium. Seems to improve more in the morning and when wife is present as well.

## 2019-06-02 NOTE — Anesthesia Postprocedure Evaluation (Signed)
Anesthesia Post Note  Patient: Derin Granquist.  Procedure(s) Performed: VIDEO BRONCHOSCOPY WITH ENDOBRONCHIAL ULTRASOUND (N/A ) INSERTION PLEURAL DRAINAGE CATHETER (N/A )     Patient location during evaluation: PACU Anesthesia Type: General Level of consciousness: awake and alert Pain management: pain level controlled Vital Signs Assessment: post-procedure vital signs reviewed and stable Respiratory status: spontaneous breathing, nonlabored ventilation and respiratory function stable Cardiovascular status: blood pressure returned to baseline and stable Postop Assessment: no apparent nausea or vomiting Anesthetic complications: no    Last Vitals:  Vitals:   06/01/19 2300 06/02/19 0725  BP: 110/61 126/70  Pulse: (!) 106 67  Resp: 13   Temp: 36.7 C 36.7 C  SpO2: 97% 97%    Last Pain:  Vitals:   06/02/19 0725  TempSrc: Oral  PainSc:                  Lynda Rainwater

## 2019-06-03 ENCOUNTER — Ambulatory Visit
Admission: RE | Admit: 2019-06-03 | Discharge: 2019-06-03 | Disposition: A | Discharge status: Home / Self Care | Payer: Medicare Other | Source: Ambulatory Visit | Attending: Radiation Oncology | Admitting: Radiation Oncology

## 2019-06-03 ENCOUNTER — Encounter (HOSPITAL_COMMUNITY): Payer: Self-pay

## 2019-06-03 ENCOUNTER — Encounter (HOSPITAL_COMMUNITY): Admission: RE | Admit: 2019-06-03 | Payer: No Typology Code available for payment source | Source: Ambulatory Visit

## 2019-06-03 ENCOUNTER — Inpatient Hospital Stay (HOSPITAL_COMMUNITY): Payer: Medicare Other

## 2019-06-03 ENCOUNTER — Encounter (HOSPITAL_COMMUNITY): Payer: Self-pay | Admitting: Oncology

## 2019-06-03 DIAGNOSIS — Z87891 Personal history of nicotine dependence: Secondary | ICD-10-CM | POA: Insufficient documentation

## 2019-06-03 DIAGNOSIS — C3411 Malignant neoplasm of upper lobe, right bronchus or lung: Secondary | ICD-10-CM

## 2019-06-03 DIAGNOSIS — G9341 Metabolic encephalopathy: Secondary | ICD-10-CM

## 2019-06-03 DIAGNOSIS — C3491 Malignant neoplasm of unspecified part of right bronchus or lung: Secondary | ICD-10-CM | POA: Insufficient documentation

## 2019-06-03 DIAGNOSIS — C3490 Malignant neoplasm of unspecified part of unspecified bronchus or lung: Secondary | ICD-10-CM

## 2019-06-03 DIAGNOSIS — C7931 Secondary malignant neoplasm of brain: Secondary | ICD-10-CM

## 2019-06-03 DIAGNOSIS — Z510 Encounter for antineoplastic radiation therapy: Secondary | ICD-10-CM | POA: Insufficient documentation

## 2019-06-03 DIAGNOSIS — J910 Malignant pleural effusion: Secondary | ICD-10-CM | POA: Insufficient documentation

## 2019-06-03 LAB — BASIC METABOLIC PANEL
Anion gap: 11 (ref 5–15)
BUN: 42 mg/dL — ABNORMAL HIGH (ref 8–23)
CO2: 21 mmol/L — ABNORMAL LOW (ref 22–32)
Calcium: 8.2 mg/dL — ABNORMAL LOW (ref 8.9–10.3)
Chloride: 105 mmol/L (ref 98–111)
Creatinine, Ser: 1.34 mg/dL — ABNORMAL HIGH (ref 0.61–1.24)
GFR calc Af Amer: 59 mL/min — ABNORMAL LOW (ref >60)
GFR calc non Af Amer: 51 mL/min — ABNORMAL LOW (ref >60)
Glucose, Bld: 137 mg/dL — ABNORMAL HIGH (ref 70–99)
Potassium: 4.5 mmol/L (ref 3.5–5.1)
Sodium: 137 mmol/L (ref 135–145)

## 2019-06-03 LAB — GLUCOSE, CAPILLARY
Glucose-Capillary: 149 mg/dL — ABNORMAL HIGH (ref 70–99)
Glucose-Capillary: 201 mg/dL — ABNORMAL HIGH (ref 70–99)
Glucose-Capillary: 281 mg/dL — ABNORMAL HIGH (ref 70–99)
Glucose-Capillary: 291 mg/dL — ABNORMAL HIGH (ref 70–99)
Glucose-Capillary: 263 mg/dL — ABNORMAL HIGH (ref 70–99)

## 2019-06-03 MED ORDER — HALOPERIDOL LACTATE 5 MG/ML IJ SOLN
1.0000 mg | Freq: Once | INTRAMUSCULAR | Status: AC
  Administered 2019-06-03: 1 mg via INTRAMUSCULAR
  Filled 2019-06-03: qty 1

## 2019-06-03 MED ORDER — DILTIAZEM HCL ER COATED BEADS 180 MG PO CP24
180.0000 mg | ORAL_CAPSULE | Freq: Every day | ORAL | Status: DC
  Administered 2019-06-03 – 2019-06-04 (×2): 180 mg via ORAL
  Filled 2019-06-03 (×2): qty 1

## 2019-06-03 MED ORDER — TRAZODONE HCL 50 MG PO TABS
50.0000 mg | ORAL_TABLET | Freq: Every day | ORAL | Status: DC
  Administered 2019-06-03: 50 mg via ORAL
  Filled 2019-06-03: qty 1

## 2019-06-03 MED ORDER — HALOPERIDOL LACTATE 5 MG/ML IJ SOLN
1.0000 mg | Freq: Two times a day (BID) | INTRAMUSCULAR | Status: DC | PRN
  Administered 2019-06-04: 1 mg via INTRAVENOUS
  Filled 2019-06-03: qty 1

## 2019-06-03 NOTE — Consult Note (Signed)
[place]  [phone] [phone]   MEDICAL ONCOLOGY - INITIAL CONSULTATION  Referral MD: Dr. Adan Sis  Reason for Referral: Metastatic cancer  HPI: Mr. Brian French is a 78 year old male with past medical history significant for diabetes mellitus, hyperlipidemia, hypertension, atrial fibrillation currently on Eliquis, and recent diagnosis of malignant pleural effusion.  The patient is a poor historian and is not sure why he was admitted to the hospital.  Review of his chart shows that he presented to the emergency room with worsening shortness of breath and abdominal discomfort.  He was in the hospital in mid October for right-sided pneumonia, parapneumonic effusion, and cytology from paracentesis on 04/30/2019 positive for malignant cells.  He is seeing the [facility] in [place] for outpatient oncology care.  He was scheduled to have a PET scan this week.  In the emergency room, he was hypotensive and chest x-ray showed a right lower lobe mass/consolidation with effusion.  White blood cell count was elevated at 34,000.  His BUN was 50 and creatinine 2.33.  His sodium was low at 126 and potassium was elevated at 6.1.  The patient had an MRI of the brain without contrast on 05/29/2019 which showed a hemorrhagic lesion in the right frontal lobe less than 1 cm with surrounding edema, highly suspicious for metastatic disease.  He then had an MRI of the brain with contrast on 05/29/2019 which showed right frontal lobe lesion with stellate enhancement which is an unusual pattern for metastatic disease but given the susceptibility and surrounding edema continue to favor metastatic disease.  However, enhancing subacute infarct is a consideration and short-term MRI follow-up with and without contrast is recommended in 2 to 4 weeks.  The patient underwent an ultrasound-guided thoracentesis with 1.3 L of fluid removed.  Cytology from his pleural fluid from 05/31/2019 did not show any  evidence of malignant cells.  PCCM was consulted this admission for evaluation of his effusion.  He underwent bronchoscopy with EUS on 06/01/2019.  Cytology and pathology results are pending.  No family at bedside at the time of visit today.  Patient reports that he has been having some intermittent headaches he is not clear as to how long these have been going on.  He also reports some vision changes which again are not new.  He reports some numbness in his hands which is not different than baseline.  He is not complaining of any chest pain or shortness of breath today.  No hemoptysis.  He is not complaining of any abdominal pain, nausea, vomiting.  Medical oncology was asked see the patient back recommendations regarding his metastatic cancer and in particular his brain lesion.   Past Medical History:  Diagnosis Date  . Diabetes mellitus without complication ([place])   . Headache disorder 05/22/2015  . Hearing loss    bilateral - hearing aids  . Hyperlipidemia   . Hypertension   :  Past Surgical History:  Procedure Laterality Date  . CHEST TUBE INSERTION N/A 06/01/2019   Procedure: INSERTION PLEURAL DRAINAGE CATHETER;  Surgeon: Julian Hy, DO;  Location: [facility];  Service: Thoracic;  Laterality: N/A;  . IR THORACENTESIS ASP PLEURAL SPACE W/IMG GUIDE  04/30/2019  . IR THORACENTESIS ASP PLEURAL SPACE W/IMG GUIDE  05/27/2019  . right testicular swelling     had I and D after swelling s/p trauma from scrotal  swelling, got hit in the testes during judo 1962  . VIDEO BRONCHOSCOPY WITH ENDOBRONCHIAL ULTRASOUND N/A 06/01/2019  Procedure: VIDEO BRONCHOSCOPY WITH ENDOBRONCHIAL ULTRASOUND;  Surgeon: Julian Hy, DO;  Location: [facility];  Service: Thoracic;  Laterality: N/A;  :  Current Facility-Administered Medications  Medication Dose Route Frequency Provider Last Rate Last Dose  . acetaminophen (TYLENOL) tablet 650 mg  650 mg Oral Q6H PRN Hongalgi, Anand D, MD      . albuterol (PROVENTIL) (2.5  MG/3ML) 0.083% nebulizer solution 2.5 mg  2.5 mg Nebulization Q2H PRN Barb Merino, MD   2.5 mg at 05/31/19 0207  . aspirin EC tablet 81 mg  81 mg Oral Daily Barb Merino, MD   81 mg at 06/03/19 0816  . atorvastatin (LIPITOR) tablet 80 mg  80 mg Oral q1800 Barb Merino, MD   80 mg at 06/02/19 1851  . ceFEPIme (MAXIPIME) 2 g in sodium chloride 0.9 % 100 mL IVPB  2 g Intravenous Q12H Barb Merino, MD 200 mL/hr at 06/03/19 1512 2 g at 06/03/19 1512  . dexamethasone (DECADRON) injection 4 mg  4 mg Intravenous Q24H Candee Furbish, MD   4 mg at 06/03/19 5573  . diltiazem (CARDIZEM CD) 24 hr capsule 180 mg  180 mg Oral Daily Modena Jansky, MD   180 mg at 06/03/19 1133  . feeding supplement (ENSURE ENLIVE) (ENSURE ENLIVE) liquid 237 mL  237 mL Oral BID BM Oretha Milch D, MD   237 mL at 06/03/19 1517  . guaiFENesin (MUCINEX) 12 hr tablet 600 mg  600 mg Oral BID Barb Merino, MD   600 mg at 06/03/19 0817  . HYDROcodone-acetaminophen (NORCO/VICODIN) 5-325 MG per tablet 1 tablet  1 tablet Oral Q12H PRN Modena Jansky, MD   1 tablet at 06/03/19 0508  . insulin aspart (novoLOG) injection 0-15 Units  0-15 Units Subcutaneous TID WC Dana Allan I, MD   5 Units at 06/03/19 1215  . insulin aspart (novoLOG) injection 0-5 Units  0-5 Units Subcutaneous QHS Dana Allan I, MD   4 Units at 05/31/19 2111  . insulin glargine (LANTUS) injection 15 Units  15 Units Subcutaneous BID Modena Jansky, MD   15 Units at 06/03/19 1000  . ipratropium-albuterol (DUONEB) 0.5-2.5 (3) MG/3ML nebulizer solution 3 mL  3 mL Nebulization Q4H PRN Bodenheimer, Charles A, NP   3 mL at 05/31/19 1140  . lactated ringers infusion   Intravenous Continuous Ellender, Karyl Kinnier, MD 10 mL/hr at 06/02/19 0249    . pantoprazole (PROTONIX) EC tablet 40 mg  40 mg Oral Daily Barb Merino, MD   40 mg at 06/03/19 0816  . sodium bicarbonate tablet 1,300 mg  1,300 mg Oral BID Dana Allan I, MD   1,300 mg at 06/03/19 0818  .  tamsulosin (FLOMAX) capsule 0.4 mg  0.4 mg Oral Daily Barb Merino, MD   0.4 mg at 06/03/19 0815     No Known Allergies:  Family History  Problem Relation Age of Onset  . Cancer Mother   . Heart disease Father   :  Social History   Socioeconomic History  . Marital status: Married    Spouse name: Gerald Stabs  . Number of children: 0  . Years of education: Grad  . Highest education level: Not on file  Occupational History  . Not on file  Social Needs  . Financial resource strain: Not on file  . Food insecurity    Worry: Not on file    Inability: Not on file  . Transportation needs    Medical: Not on file    Non-medical:  Not on file  Tobacco Use  . Smoking status: Former Smoker    Packs/day: 2.00    Types: Cigarettes    Quit date: 07/15/1997    Years since quitting: 21.8  . Smokeless tobacco: Never Used  Substance and Sexual Activity  . Alcohol use: Yes    Alcohol/week: 0.0 standard drinks    Comment: occasionally  . Drug use: Never  . Sexual activity: Not on file  Lifestyle  . Physical activity    Days per week: Not on file    Minutes per session: Not on file  . Stress: Not on file  Relationships  . Social Herbalist on phone: Not on file    Gets together: Not on file    Attends religious service: Not on file    Active member of club or organization: Not on file    Attends meetings of clubs or organizations: Not on file    Relationship status: Not on file  . Intimate partner violence    Fear of current or ex partner: Not on file    Emotionally abused: Not on file    Physically abused: Not on file    Forced sexual activity: Not on file  Other Topics Concern  . Not on file  Social History Narrative   Patient lives at home with his spouse.   Caffeine Use:   :  Review of Systems: A comprehensive 14 point review of systems was negative except as noted in the HPI.  Exam: Patient Vitals for the past 24 hrs:  BP Temp Temp src Pulse Resp SpO2 Weight   06/03/19 0800 118/62 98 F (36.7 C) Oral - - 95 % -  06/03/19 0500 - - - - - - 210 lb 1.6 oz (95.3 kg)  06/02/19 2354 121/64 98.2 F (36.8 C) Oral - 16 96 % -  06/02/19 1851 (!) 107/52 - - - - - -  06/02/19 1635 133/84 97.9 F (36.6 C) Oral 98 17 98 % -    General:  No acute distress.   Eyes:  no scleral icterus.   ENT:  There were no oropharyngeal lesions.   Neck was without thyromegaly.   Lymphatics:  Negative cervical, supraclavicular or axillary adenopathy.   Respiratory: lungs were clear bilaterally without wheezing or crackles.   Cardiovascular:  Regular rate and rhythm, S1/S2, without murmur, rub or gallop.  There was no pedal edema.   GI:  abdomen was soft, flat, nontender, nondistended, without organomegaly.   Musculoskeletal:  no spinal tenderness of palpation of vertebral spine.   Skin exam was without echymosis, petichae.   Neuro exam was nonfocal.  The patient was alert.  Oriented to person.  Thinks that he is at the [facility] that Merrilyn Puma is the president.  Lab Results  Component Value Date   WBC 43.6 (H) 06/02/2019   HGB 11.5 (L) 06/02/2019   HCT 34.0 (L) 06/02/2019   PLT 279 06/02/2019   GLUCOSE 137 (H) 06/03/2019   ALT 16 05/27/2019   AST 15 05/27/2019   NA 137 06/03/2019   K 4.5 06/03/2019   CL 105 06/03/2019   CREATININE 1.34 (H) 06/03/2019   BUN 42 (H) 06/03/2019   CO2 21 (L) 06/03/2019    Ct Head Wo Contrast  Result Date: 06/03/2019 CLINICAL DATA:  Follow-up possible metastasis on MRI EXAM: CT HEAD WITHOUT CONTRAST TECHNIQUE: Contiguous axial images were obtained from the base of the skull through the vertex without intravenous  contrast. COMPARISON:  Correlation made with prior MR imaging FINDINGS: Brain: In the region of the right frontal lesion on MRI, there is some ill-defined hypoattenuation. No mass effect or acute hemorrhage. There is no extra-axial fluid collection. Ventricles and sulci are mildly prominent, reflecting parenchymal  volume loss. Patchy and confluent areas of hypoattenuation in the supratentorial white matter are nonspecific but may reflect mild to moderate chronic microvascular ischemic changes. Vascular: There is minor intracranial atherosclerotic calcification at the skull base. Skull: Calvarium is unremarkable. Sinuses/Orbits: No acute finding. Other: None. IMPRESSION: Some ill-defined hypoattenuation in the region of right frontal lesion on recent prior MRI. There is no mass effect or hemorrhage. Followup MR imaging is recommended over a greater interval. Electronically Signed   By: Macy Mis M.D.   On: 06/03/2019 14:28   Ct Chest Wo Contrast  Result Date: 05/28/2019 CLINICAL DATA:  History of metastatic non-small cell lung cancer. EXAM: CT CHEST WITHOUT CONTRAST TECHNIQUE: Multidetector CT imaging of the chest was performed following the standard protocol without IV contrast. COMPARISON:  CT angio chest 04/29/2019 FINDINGS: Cardiovascular: The heart size appears normal. There is a small pericardial effusion. Stable to slightly decreased in volume from previous exam. Aortic atherosclerosis. Left main, lad, left circumflex and RCA coronary artery calcifications Mediastinum/Nodes: Normal appearance of the thyroid gland. The trachea appears patent and is midline. Normal appearance of the esophagus. 1 cm right paratracheal lymph node is identified, 5/3. Unchanged. Enlarged subcarinal lymph node is difficult to measure due to lack of IV contrast material and progressive right pleural effusion extending into the subcarinal region. The best estimate is this measures approximate 2.7 cm, image 79/3. Previously 3 cm. The hilar structures are suboptimally evaluated due to lack of IV contrast material. Lungs/Pleura: Large right pleural effusion is increased volume when compared with previous exam. There is persistent and progressive atelectasis of the right middle lobe. Presumed central obstructing lesion is difficult to  identify to lack of IV contrast material. Rounded density within the right upper lobe appears smaller but progressively more solid measuring 2.5 x 2.0 cm, image 38/4. Previously 2.5 x 3.1 cm. Upper Abdomen: No acute abnormality identified. Enlarged left adrenal gland is partially imaged. Musculoskeletal: No chest wall mass or suspicious bone lesions identified. IMPRESSION: 1. Comparison to previous contrast enhanced CT of the chest from 05/08/2019 is significantly limited largely due to lack of IV contrast material. 2. Interval increase in volume of right pleural effusion. There is progressive atelectasis within the right middle lobe which may be postobstructive. Central right hilar lesion cannot be excluded. 3. Persistent enlarged subcarinal lymph node as noted on previous exam. Comparison with the prior study however is limited due to lack of IV contrast material. 4. Right upper lobe nodular density is smaller but more solid when compared with previous exam. This is nonspecific but may represent an area progressive organizing pneumonia. Electronically Signed   By: Kerby Moors M.D.   On: 05/28/2019 07:22   Mr Brain Wo Contrast  Result Date: 05/29/2019 CLINICAL DATA:  Combined small and non smell also lung cancer. Staging. EXAM: MRI HEAD WITHOUT CONTRAST TECHNIQUE: Multiplanar, multiecho pulse sequences of the brain and surrounding structures were obtained without intravenous contrast. COMPARISON:  MRI head 05/09/2015 FINDINGS: Brain: New lesion in the right frontal lobe with surrounding edema and mild hemorrhage. This is highly likely to be metastatic disease. Postcontrast imaging recommended to confirm. Multiple small white matter hyperintensities bilaterally are indeterminate. Small lesions in the right parietal subcortical white matter  are suspicious for metastatic disease but contrast enhanced imaging is needed. Generalized atrophy.  Negative for acute infarct. Vascular: Normal arterial flow voids Skull  and upper cervical spine: No focal skeletal abnormality. Sinuses/Orbits: Negative Other: Image quality degraded by motion. Limited evaluation for metastatic disease without intravenous contrast IMPRESSION: Hemorrhagic lesion right frontal lobe less than 1 cm with surrounding edema, highly suspicious for metastatic disease. Recommend postcontrast imaging for confirmation and evaluate for other possible lesions. Generalized atrophy.  No acute infarct. Electronically Signed   By: Franchot Gallo M.D.   On: 05/29/2019 11:13   Mr Brain W Contrast  Result Date: 05/29/2019 CLINICAL DATA:  Lung cancer rule out metastatic disease. EXAM: MRI HEAD WITH CONTRAST TECHNIQUE: Multiplanar, multiecho pulse sequences of the brain and surrounding structures were obtained with intravenous contrast. CONTRAST:  54mL GADAVIST GADOBUTROL 1 MMOL/ML IV SOLN COMPARISON:  Unenhanced MRI head 05/29/2019 FINDINGS: Right frontal lobe abnormality shows stellate enhancement. This area showed susceptibility suggestive of hemorrhage with surrounding edema. The stellate pattern of enhancement is unusual for metastatic disease. No other enhancing lesions in the brain identified. Ventricle size normal.  No midline shift. IMPRESSION: Right frontal lobe lesion shows stellate enhancement. This pattern is unusual for metastatic disease. Given the susceptibility and surrounding edema, continue to favor metastatic disease however enhancing subacute infarct is a consideration and short-term MRI follow-up without and with contrast is recommended in 2-4 weeks. Electronically Signed   By: Franchot Gallo M.D.   On: 05/29/2019 13:06   US Renal  Result Date: 05/31/2019 CLINICAL DATA:  Acute kidney injury. EXAM: RENAL / URINARY TRACT ULTRASOUND COMPLETE COMPARISON:  CT 05/08/2019 FINDINGS: Right Kidney: Renal measurements: 10.7 x 4.6 x 4.4 cm = volume: 112.4 mL. Thinning of renal parenchyma with increased renal echogenicity. No hydronephrosis. Multiple (less  than 10) cysts within the kidney, all less than 1 cm. No obvious intra or perirenal fluid collection. Left Kidney: Renal measurements: 14.0 x 7.9 x 6.0 cm = volume: 349 mL. Slight increased renal echogenicity. No hydronephrosis. Multiple (less than 10) cysts within the left kidney. Largest cyst superior medially measures 4.9 cm greatest dimension. No obvious intrarenal or perirenal fluid collection. Bladder: Appears normal for degree of bladder distention. Both ureteral jets are visualized. Other: None. IMPRESSION: 1. No obstructive uropathy. 2. Increased bilateral renal echogenicity with mild thinning of the right renal parenchyma suggesting chronic medical renal disease. 3. Bilateral renal cysts. Electronically Signed   By: Keith Rake M.D.   On: 05/31/2019 23:46   Dg Chest Port 1 View  Result Date: 06/01/2019 CLINICAL DATA:  Chest tube EXAM: PORTABLE CHEST 1 VIEW COMPARISON:  05/31/2019 FINDINGS: Interval placement of PleurX on the right. No visible pneumothorax. Continued suspected layering right effusion. Right infrahilar atelectasis again noted. Left lung clear. Heart is upper limits normal in size. No acute bony abnormality lateral left 6th rib fracture, chronic. IMPRESSION: Interval placement of right PleurX catheter. No pneumothorax. Probable continued layering right effusion with right infrahilar atelectasis. Electronically Signed   By: Rolm Baptise M.D.   On: 06/01/2019 19:18   Dg Chest Port 1 View  Result Date: 05/31/2019 CLINICAL DATA:  Status post thoracentesis. EXAM: PORTABLE CHEST 1 VIEW COMPARISON:  May 30, 2019. FINDINGS: Stable cardiomediastinal silhouette. Right pleural effusion appears to be smaller. Right infrahilar atelectasis is noted. Left lung is clear. No pneumothorax is noted. Bony thorax is unremarkable. IMPRESSION: No pneumothorax status post right-sided thoracentesis. Right pleural effusion appears smaller. Electronically Signed   By: Sabino Dick  Jr M.D.   On:  05/31/2019 14:15   Dg Chest Port 1 View  Result Date: 05/30/2019 CLINICAL DATA:  Dyspnea, pleural effusion EXAM: PORTABLE CHEST 1 VIEW COMPARISON:  05/28/2019 FINDINGS: Slight interval increase in a moderate, layering right pleural effusion with associated atelectasis or consolidation. The left lung is normally aerated. Cardiomegaly. IMPRESSION: 1. Slight interval increase in a moderate, layering right pleural effusion with associated atelectasis or consolidation. 2.  The left lung is normally aerated. 3.  Cardiomegaly. Electronically Signed   By: Eddie Candle M.D.   On: 05/30/2019 14:58   Dg Chest Port 1 View  Result Date: 05/28/2019 CLINICAL DATA:  Shortness of breath. EXAM: PORTABLE CHEST 1 VIEW COMPARISON:  05/27/2019.  CT 05/28/2019.  04/29/2019. FINDINGS: Mediastinum unchanged. Stable cardiomegaly. Dense right middle lobe atelectatic changes again noted. Underlying mass lesion again cannot be excluded. Right pleural effusion has increased in size. Reference is made to today's chest CT for further evaluation. No pneumothorax. IMPRESSION: 1. Persistent right middle lobe atelectasis. Underlying mass lesion cannot be excluded. 2. Progressive right pleural effusion. Reference is made to today's chest CT for further evaluation. Electronically Signed   By: Marcello Moores  Register   On: 05/28/2019 12:10   Dg Chest Port 1 View  Result Date: 05/27/2019 CLINICAL DATA:  Pleural effusion. EXAM: PORTABLE CHEST 1 VIEW COMPARISON:  Chest x-ray May 27, 2019 FINDINGS: The right-sided pleural effusion is smaller in the interval after thoracentesis. No pneumothorax. Centralized somewhat masslike opacities seen in the right perihilar region, better assessed on previous CT imaging. A left lower veins clear. The cardiomediastinal silhouette is stable. IMPRESSION: 1. Centralized somewhat masslike opacity in the right perihilar region was better assessed on previous CT imaging and is stable. 2. The right-sided pleural  effusion is smaller in the interval after thoracentesis with no pneumothorax. Electronically Signed   By: Dorise Bullion III M.D   On: 05/27/2019 18:51   Dg Chest Portable 1 View  Result Date: 05/27/2019 CLINICAL DATA:  Patient brought in by wife stating patient c/o shortness of breath, decreased appetite, dizziness and pain all over. EXAM: PORTABLE CHEST 1 VIEW COMPARISON:  04/30/2019 and earlier exams. FINDINGS: There is significant increased opacity on the right when compared to the most recent prior chest radiograph. There is at least a moderate pleural effusion. Additional opacity right perihilar and lower lung consistent with atelectasis, pneumonia or a combination. This has also increased from the prior exam. Left lung remains clear.  No left pleural effusion. Cardiac silhouette is normal in size. No mediastinal or left hilar mass. Right hilum partly obscured. Skeletal structures are grossly intact. IMPRESSION: 1. Worsening aeration on the right compared to the most recent prior study. Previously noted right pleural effusion has enlarged. There is increased opacity in the right perihilar and right lung base is consistent with pneumonia, atelectasis or a combination, with atelectasis favored. Electronically Signed   By: Lajean Manes M.D.   On: 05/27/2019 12:07   Ct Angio Abd/pel W And/or Wo Contrast  Result Date: 05/08/2019 CLINICAL DATA:  Question ischemia or renal infarct, recent diagnosis of atrial fibrillation, recent abnormal CT with persistent right abdominal pain. EXAM: CTA ABDOMEN AND PELVIS WITHOUT AND WITH CONTRAST TECHNIQUE: Multidetector CT imaging of the abdomen and pelvis was performed using the standard protocol during bolus administration of intravenous contrast. Multiplanar reconstructed images and MIPs were obtained and reviewed to evaluate the vascular anatomy. CONTRAST:  48mL OMNIPAQUE IOHEXOL 350 MG/ML SOLN COMPARISON:  CT 04/29/2019 FINDINGS: VASCULAR Aorta:  Mixture of  calcified noncalcified atherosclerotic plaque throughout the abdominal aorta. There is fusiform abdominal aortic aneurysm measuring up to 3 cm in maximal diameter. No periaortic stranding. No occlusion or stenosis. Celiac: Ostial plaque results in mild narrowing. Minimal plaque within the proximal vessel without significant stenosis. No evidence of aneurysm, dissection or vasculitis. SMA: Ostial plaque results and mild narrowing. Minimal atheromatous plaque in the proximal vessel. No significant stenosis. No evidence of aneurysm, dissection or vasculitis. Renals: Single renal arteries bilaterally. Plaque results in at least moderate narrowing of the proximal right renal artery. Normal distal opacification. Ostial plaque results and mild narrowing of the left renal artery as well. IMA: IMA arises from the fusiform abdominal aortic aneurysm. No evidence of aneurysm, dissection or vasculitis. Inflow: No extension of the aneurysm into the proximal inflow. Calcified noncalcified plaque seen throughout the inflow vessels. No aneurysm, dissection, vasculitis or significant stenosis. Proximal Outflow: Plaque noted in the common femoral arteries and at the level of the bifurcations without significant stenosis, aneurysm or dissection Veins: Venous phase imaging reveals no significant venous abnormality. Portal and hepatic veins are patent. Review of the MIP images confirms the above findings. NON-VASCULAR Lower chest: Redemonstration of a moderate right pleural effusion with adjacent areas of passive atelectasis in the right lung base. No abnormal pleural thickening or enhancement. Small volume pericardial effusion. Cardiac size is within normal limits. Hepatobiliary: No focal liver abnormality is seen. No gallstones, gallbladder wall thickening, or biliary dilatation. Pancreas: Fatty replacement of the pancreas. No pancreatic ductal dilatation or surrounding inflammatory changes. Spleen: Normal in size without focal  abnormality. Adrenals/Urinary Tract: Stable appearance of the 1.6 cm left adrenal nodule. Right adrenal gland is unremarkable. Exophytic left renal cysts are similar to prior exam. There is moderate bilateral perinephric stranding and low-attenuation striations of the bilateral renal parenchyma. Ill-defined rounded hypoattenuating lesions involving both kidneys are suspicious for lobar nephronia or developing abscess, less likely infarct in the setting of additional inflammation and infectious features. Right extrarenal pelvis. Urothelial thickening of both collecting systems. Circumferential thickening of the urinary bladder with bladder wall trabeculations. Stomach/Bowel: Distal esophagus, stomach and duodenal sweep are unremarkable. No small bowel wall thickening or dilatation. No evidence of obstruction. A normal appendix is visualized. No colonic dilatation or wall thickening. Lymphatic: No suspicious or enlarged lymph nodes in the included lymphatic chains. Reproductive: Borderline prostatomegaly. Other: No abdominopelvic free fluid or free gas. No bowel containing hernias. Small fat containing inguinal hernias. Musculoskeletal: Multilevel degenerative changes are present in the imaged portions of the spine. Findings most pronounced at L3-4 with a calcified disc bulge resulting in moderate canal stenosis. Features likely exacerbated by what appear to be congenitally short pedicles. No acute osseous abnormality or suspicious osseous lesion. IMPRESSION: VASCULAR 1. Aortic Atherosclerosis (ICD10-I70.0). 2. Ostial plaque results in mild narrowing of the celiac, SMA, and left renal artery origins. 3. Moderate segmental narrowing of the proximal right renal artery secondary to atheromatous plaque. 4. Infrarenal abdominal aortic aneurysm measuring up 3 cm. Recommend followup by ultrasound in 3 years. This recommendation follows ACR consensus guidelines: White Paper of the ACR Incidental Findings Committee II on  Vascular Findings. J Am Coll Radiol 2013; 10:789-794. Aortic aneurysm NOS (ICD10-I71.9) NON-VASCULAR 1. Bilateral perinephric and periureteral stranding with urothelial thickening, nephrographic striations and ill-defined rounded hypoattenuating lesions involving both kidneys, suspicious for lobar nephronia or developing abscess, less likely infarct given the additional features supporting an ascending urinary tract infection. 2. Circumferential thickening of the urinary bladder with bladder wall  trabeculations, consistent with chronic outlet obstruction or neurogenic bladder. 3. Moderate right pleural effusion with adjacent areas of passive atelectasis in the right lung base. 4. Small volume pericardial effusion. These results were called by telephone at the time of interpretation on 05/08/2019 at 7:23 pm to provider [facility] , who verbally acknowledged these results. Electronically Signed   By: Lovena Le M.D.   On: 05/08/2019 19:24   Ir Thoracentesis Asp Pleural Space W/img Guide  Result Date: 05/31/2019 INDICATION: Patient with history of AFib on Eliquis, recently diagnosed malignant pleural effusion presented to the ED today with worsening dyspnea. Chest x-ray shows large right pleural effusion. Request IR for diagnostic and therapeutic thoracentesis. EXAM: ULTRASOUND GUIDED RIGHT THORACENTESIS MEDICATIONS: 8 mL 1% lidocaine COMPLICATIONS: None immediate. PROCEDURE: An ultrasound guided thoracentesis was thoroughly discussed with the patient and questions answered. The benefits, risks, alternatives and complications were also discussed. The patient understands and wishes to proceed with the procedure. Written consent was obtained. Ultrasound was performed to localize and mark an adequate pocket of fluid in the right chest. The area was then prepped and draped in the normal sterile fashion. 1% Lidocaine was used for local anesthesia. Under ultrasound guidance a 6 Fr Safe-T-Centesis catheter was  introduced. Thoracentesis was performed. The catheter was removed and a dressing applied. FINDINGS: A total of approximately 1.3 L of bloody fluid was removed. Samples were sent to the laboratory as requested by the clinical team. IMPRESSION: Successful ultrasound guided right thoracentesis yielding 1.3 L of pleural fluid. Read by Candiss Norse, PA-C Electronically Signed   By: Lucrezia Europe M.D.   On: 05/27/2019 17:07     Ct Head Wo Contrast  Result Date: 06/03/2019 CLINICAL DATA:  Follow-up possible metastasis on MRI EXAM: CT HEAD WITHOUT CONTRAST TECHNIQUE: Contiguous axial images were obtained from the base of the skull through the vertex without intravenous contrast. COMPARISON:  Correlation made with prior MR imaging FINDINGS: Brain: In the region of the right frontal lesion on MRI, there is some ill-defined hypoattenuation. No mass effect or acute hemorrhage. There is no extra-axial fluid collection. Ventricles and sulci are mildly prominent, reflecting parenchymal volume loss. Patchy and confluent areas of hypoattenuation in the supratentorial white matter are nonspecific but may reflect mild to moderate chronic microvascular ischemic changes. Vascular: There is minor intracranial atherosclerotic calcification at the skull base. Skull: Calvarium is unremarkable. Sinuses/Orbits: No acute finding. Other: None. IMPRESSION: Some ill-defined hypoattenuation in the region of right frontal lesion on recent prior MRI. There is no mass effect or hemorrhage. Followup MR imaging is recommended over a greater interval. Electronically Signed   By: Macy Mis M.D.   On: 06/03/2019 14:28   Ct Chest Wo Contrast  Result Date: 05/28/2019 CLINICAL DATA:  History of metastatic non-small cell lung cancer. EXAM: CT CHEST WITHOUT CONTRAST TECHNIQUE: Multidetector CT imaging of the chest was performed following the standard protocol without IV contrast. COMPARISON:  CT angio chest 04/29/2019 FINDINGS:  Cardiovascular: The heart size appears normal. There is a small pericardial effusion. Stable to slightly decreased in volume from previous exam. Aortic atherosclerosis. Left main, lad, left circumflex and RCA coronary artery calcifications Mediastinum/Nodes: Normal appearance of the thyroid gland. The trachea appears patent and is midline. Normal appearance of the esophagus. 1 cm right paratracheal lymph node is identified, 5/3. Unchanged. Enlarged subcarinal lymph node is difficult to measure due to lack of IV contrast material and progressive right pleural effusion extending into the subcarinal region. The best estimate is this  measures approximate 2.7 cm, image 79/3. Previously 3 cm. The hilar structures are suboptimally evaluated due to lack of IV contrast material. Lungs/Pleura: Large right pleural effusion is increased volume when compared with previous exam. There is persistent and progressive atelectasis of the right middle lobe. Presumed central obstructing lesion is difficult to identify to lack of IV contrast material. Rounded density within the right upper lobe appears smaller but progressively more solid measuring 2.5 x 2.0 cm, image 38/4. Previously 2.5 x 3.1 cm. Upper Abdomen: No acute abnormality identified. Enlarged left adrenal gland is partially imaged. Musculoskeletal: No chest wall mass or suspicious bone lesions identified. IMPRESSION: 1. Comparison to previous contrast enhanced CT of the chest from 05/08/2019 is significantly limited largely due to lack of IV contrast material. 2. Interval increase in volume of right pleural effusion. There is progressive atelectasis within the right middle lobe which may be postobstructive. Central right hilar lesion cannot be excluded. 3. Persistent enlarged subcarinal lymph node as noted on previous exam. Comparison with the prior study however is limited due to lack of IV contrast material. 4. Right upper lobe nodular density is smaller but more solid when  compared with previous exam. This is nonspecific but may represent an area progressive organizing pneumonia. Electronically Signed   By: Kerby Moors M.D.   On: 05/28/2019 07:22   Mr Brain Wo Contrast  Result Date: 05/29/2019 CLINICAL DATA:  Combined small and non smell also lung cancer. Staging. EXAM: MRI HEAD WITHOUT CONTRAST TECHNIQUE: Multiplanar, multiecho pulse sequences of the brain and surrounding structures were obtained without intravenous contrast. COMPARISON:  MRI head 05/09/2015 FINDINGS: Brain: New lesion in the right frontal lobe with surrounding edema and mild hemorrhage. This is highly likely to be metastatic disease. Postcontrast imaging recommended to confirm. Multiple small white matter hyperintensities bilaterally are indeterminate. Small lesions in the right parietal subcortical white matter are suspicious for metastatic disease but contrast enhanced imaging is needed. Generalized atrophy.  Negative for acute infarct. Vascular: Normal arterial flow voids Skull and upper cervical spine: No focal skeletal abnormality. Sinuses/Orbits: Negative Other: Image quality degraded by motion. Limited evaluation for metastatic disease without intravenous contrast IMPRESSION: Hemorrhagic lesion right frontal lobe less than 1 cm with surrounding edema, highly suspicious for metastatic disease. Recommend postcontrast imaging for confirmation and evaluate for other possible lesions. Generalized atrophy.  No acute infarct. Electronically Signed   By: Franchot Gallo M.D.   On: 05/29/2019 11:13   Mr Brain W Contrast  Result Date: 05/29/2019 CLINICAL DATA:  Lung cancer rule out metastatic disease. EXAM: MRI HEAD WITH CONTRAST TECHNIQUE: Multiplanar, multiecho pulse sequences of the brain and surrounding structures were obtained with intravenous contrast. CONTRAST:  47mL GADAVIST GADOBUTROL 1 MMOL/ML IV SOLN COMPARISON:  Unenhanced MRI head 05/29/2019 FINDINGS: Right frontal lobe abnormality shows  stellate enhancement. This area showed susceptibility suggestive of hemorrhage with surrounding edema. The stellate pattern of enhancement is unusual for metastatic disease. No other enhancing lesions in the brain identified. Ventricle size normal.  No midline shift. IMPRESSION: Right frontal lobe lesion shows stellate enhancement. This pattern is unusual for metastatic disease. Given the susceptibility and surrounding edema, continue to favor metastatic disease however enhancing subacute infarct is a consideration and short-term MRI follow-up without and with contrast is recommended in 2-4 weeks. Electronically Signed   By: Franchot Gallo M.D.   On: 05/29/2019 13:06   US Renal  Result Date: 05/31/2019 CLINICAL DATA:  Acute kidney injury. EXAM: RENAL / URINARY TRACT ULTRASOUND COMPLETE COMPARISON:  CT 05/08/2019 FINDINGS: Right Kidney: Renal measurements: 10.7 x 4.6 x 4.4 cm = volume: 112.4 mL. Thinning of renal parenchyma with increased renal echogenicity. No hydronephrosis. Multiple (less than 10) cysts within the kidney, all less than 1 cm. No obvious intra or perirenal fluid collection. Left Kidney: Renal measurements: 14.0 x 7.9 x 6.0 cm = volume: 349 mL. Slight increased renal echogenicity. No hydronephrosis. Multiple (less than 10) cysts within the left kidney. Largest cyst superior medially measures 4.9 cm greatest dimension. No obvious intrarenal or perirenal fluid collection. Bladder: Appears normal for degree of bladder distention. Both ureteral jets are visualized. Other: None. IMPRESSION: 1. No obstructive uropathy. 2. Increased bilateral renal echogenicity with mild thinning of the right renal parenchyma suggesting chronic medical renal disease. 3. Bilateral renal cysts. Electronically Signed   By: Keith Rake M.D.   On: 05/31/2019 23:46   Dg Chest Port 1 View  Result Date: 06/01/2019 CLINICAL DATA:  Chest tube EXAM: PORTABLE CHEST 1 VIEW COMPARISON:  05/31/2019 FINDINGS: Interval  placement of PleurX on the right. No visible pneumothorax. Continued suspected layering right effusion. Right infrahilar atelectasis again noted. Left lung clear. Heart is upper limits normal in size. No acute bony abnormality lateral left 6th rib fracture, chronic. IMPRESSION: Interval placement of right PleurX catheter. No pneumothorax. Probable continued layering right effusion with right infrahilar atelectasis. Electronically Signed   By: Rolm Baptise M.D.   On: 06/01/2019 19:18   Dg Chest Port 1 View  Result Date: 05/31/2019 CLINICAL DATA:  Status post thoracentesis. EXAM: PORTABLE CHEST 1 VIEW COMPARISON:  May 30, 2019. FINDINGS: Stable cardiomediastinal silhouette. Right pleural effusion appears to be smaller. Right infrahilar atelectasis is noted. Left lung is clear. No pneumothorax is noted. Bony thorax is unremarkable. IMPRESSION: No pneumothorax status post right-sided thoracentesis. Right pleural effusion appears smaller. Electronically Signed   By: Marijo Conception M.D.   On: 05/31/2019 14:15   Dg Chest Port 1 View  Result Date: 05/30/2019 CLINICAL DATA:  Dyspnea, pleural effusion EXAM: PORTABLE CHEST 1 VIEW COMPARISON:  05/28/2019 FINDINGS: Slight interval increase in a moderate, layering right pleural effusion with associated atelectasis or consolidation. The left lung is normally aerated. Cardiomegaly. IMPRESSION: 1. Slight interval increase in a moderate, layering right pleural effusion with associated atelectasis or consolidation. 2.  The left lung is normally aerated. 3.  Cardiomegaly. Electronically Signed   By: Eddie Candle M.D.   On: 05/30/2019 14:58   Dg Chest Port 1 View  Result Date: 05/28/2019 CLINICAL DATA:  Shortness of breath. EXAM: PORTABLE CHEST 1 VIEW COMPARISON:  05/27/2019.  CT 05/28/2019.  04/29/2019. FINDINGS: Mediastinum unchanged. Stable cardiomegaly. Dense right middle lobe atelectatic changes again noted. Underlying mass lesion again cannot be excluded.  Right pleural effusion has increased in size. Reference is made to today's chest CT for further evaluation. No pneumothorax. IMPRESSION: 1. Persistent right middle lobe atelectasis. Underlying mass lesion cannot be excluded. 2. Progressive right pleural effusion. Reference is made to today's chest CT for further evaluation. Electronically Signed   By: Marcello Moores  Register   On: 05/28/2019 12:10   Dg Chest Port 1 View  Result Date: 05/27/2019 CLINICAL DATA:  Pleural effusion. EXAM: PORTABLE CHEST 1 VIEW COMPARISON:  Chest x-ray May 27, 2019 FINDINGS: The right-sided pleural effusion is smaller in the interval after thoracentesis. No pneumothorax. Centralized somewhat masslike opacities seen in the right perihilar region, better assessed on previous CT imaging. A left lower veins clear. The cardiomediastinal silhouette is stable. IMPRESSION: 1. Centralized  somewhat masslike opacity in the right perihilar region was better assessed on previous CT imaging and is stable. 2. The right-sided pleural effusion is smaller in the interval after thoracentesis with no pneumothorax. Electronically Signed   By: Dorise Bullion III M.D   On: 05/27/2019 18:51   Dg Chest Portable 1 View  Result Date: 05/27/2019 CLINICAL DATA:  Patient brought in by wife stating patient c/o shortness of breath, decreased appetite, dizziness and pain all over. EXAM: PORTABLE CHEST 1 VIEW COMPARISON:  04/30/2019 and earlier exams. FINDINGS: There is significant increased opacity on the right when compared to the most recent prior chest radiograph. There is at least a moderate pleural effusion. Additional opacity right perihilar and lower lung consistent with atelectasis, pneumonia or a combination. This has also increased from the prior exam. Left lung remains clear.  No left pleural effusion. Cardiac silhouette is normal in size. No mediastinal or left hilar mass. Right hilum partly obscured. Skeletal structures are grossly intact.  IMPRESSION: 1. Worsening aeration on the right compared to the most recent prior study. Previously noted right pleural effusion has enlarged. There is increased opacity in the right perihilar and right lung base is consistent with pneumonia, atelectasis or a combination, with atelectasis favored. Electronically Signed   By: Lajean Manes M.D.   On: 05/27/2019 12:07   Ct Angio Abd/pel W And/or Wo Contrast  Result Date: 05/08/2019 CLINICAL DATA:  Question ischemia or renal infarct, recent diagnosis of atrial fibrillation, recent abnormal CT with persistent right abdominal pain. EXAM: CTA ABDOMEN AND PELVIS WITHOUT AND WITH CONTRAST TECHNIQUE: Multidetector CT imaging of the abdomen and pelvis was performed using the standard protocol during bolus administration of intravenous contrast. Multiplanar reconstructed images and MIPs were obtained and reviewed to evaluate the vascular anatomy. CONTRAST:  97mL OMNIPAQUE IOHEXOL 350 MG/ML SOLN COMPARISON:  CT 04/29/2019 FINDINGS: VASCULAR Aorta: Mixture of calcified noncalcified atherosclerotic plaque throughout the abdominal aorta. There is fusiform abdominal aortic aneurysm measuring up to 3 cm in maximal diameter. No periaortic stranding. No occlusion or stenosis. Celiac: Ostial plaque results in mild narrowing. Minimal plaque within the proximal vessel without significant stenosis. No evidence of aneurysm, dissection or vasculitis. SMA: Ostial plaque results and mild narrowing. Minimal atheromatous plaque in the proximal vessel. No significant stenosis. No evidence of aneurysm, dissection or vasculitis. Renals: Single renal arteries bilaterally. Plaque results in at least moderate narrowing of the proximal right renal artery. Normal distal opacification. Ostial plaque results and mild narrowing of the left renal artery as well. IMA: IMA arises from the fusiform abdominal aortic aneurysm. No evidence of aneurysm, dissection or vasculitis. Inflow: No extension of the  aneurysm into the proximal inflow. Calcified noncalcified plaque seen throughout the inflow vessels. No aneurysm, dissection, vasculitis or significant stenosis. Proximal Outflow: Plaque noted in the common femoral arteries and at the level of the bifurcations without significant stenosis, aneurysm or dissection Veins: Venous phase imaging reveals no significant venous abnormality. Portal and hepatic veins are patent. Review of the MIP images confirms the above findings. NON-VASCULAR Lower chest: Redemonstration of a moderate right pleural effusion with adjacent areas of passive atelectasis in the right lung base. No abnormal pleural thickening or enhancement. Small volume pericardial effusion. Cardiac size is within normal limits. Hepatobiliary: No focal liver abnormality is seen. No gallstones, gallbladder wall thickening, or biliary dilatation. Pancreas: Fatty replacement of the pancreas. No pancreatic ductal dilatation or surrounding inflammatory changes. Spleen: Normal in size without focal abnormality. Adrenals/Urinary Tract: Stable appearance of the  1.6 cm left adrenal nodule. Right adrenal gland is unremarkable. Exophytic left renal cysts are similar to prior exam. There is moderate bilateral perinephric stranding and low-attenuation striations of the bilateral renal parenchyma. Ill-defined rounded hypoattenuating lesions involving both kidneys are suspicious for lobar nephronia or developing abscess, less likely infarct in the setting of additional inflammation and infectious features. Right extrarenal pelvis. Urothelial thickening of both collecting systems. Circumferential thickening of the urinary bladder with bladder wall trabeculations. Stomach/Bowel: Distal esophagus, stomach and duodenal sweep are unremarkable. No small bowel wall thickening or dilatation. No evidence of obstruction. A normal appendix is visualized. No colonic dilatation or wall thickening. Lymphatic: No suspicious or enlarged lymph  nodes in the included lymphatic chains. Reproductive: Borderline prostatomegaly. Other: No abdominopelvic free fluid or free gas. No bowel containing hernias. Small fat containing inguinal hernias. Musculoskeletal: Multilevel degenerative changes are present in the imaged portions of the spine. Findings most pronounced at L3-4 with a calcified disc bulge resulting in moderate canal stenosis. Features likely exacerbated by what appear to be congenitally short pedicles. No acute osseous abnormality or suspicious osseous lesion. IMPRESSION: VASCULAR 1. Aortic Atherosclerosis (ICD10-I70.0). 2. Ostial plaque results in mild narrowing of the celiac, SMA, and left renal artery origins. 3. Moderate segmental narrowing of the proximal right renal artery secondary to atheromatous plaque. 4. Infrarenal abdominal aortic aneurysm measuring up 3 cm. Recommend followup by ultrasound in 3 years. This recommendation follows ACR consensus guidelines: White Paper of the ACR Incidental Findings Committee II on Vascular Findings. J Am Coll Radiol 2013; 10:789-794. Aortic aneurysm NOS (ICD10-I71.9) NON-VASCULAR 1. Bilateral perinephric and periureteral stranding with urothelial thickening, nephrographic striations and ill-defined rounded hypoattenuating lesions involving both kidneys, suspicious for lobar nephronia or developing abscess, less likely infarct given the additional features supporting an ascending urinary tract infection. 2. Circumferential thickening of the urinary bladder with bladder wall trabeculations, consistent with chronic outlet obstruction or neurogenic bladder. 3. Moderate right pleural effusion with adjacent areas of passive atelectasis in the right lung base. 4. Small volume pericardial effusion. These results were called by telephone at the time of interpretation on 05/08/2019 at 7:23 pm to provider [facility] , who verbally acknowledged these results. Electronically Signed   By: Lovena Le M.D.   On:  05/08/2019 19:24   Ir Thoracentesis Asp Pleural Space W/img Guide  Result Date: 05/31/2019 INDICATION: Patient with history of AFib on Eliquis, recently diagnosed malignant pleural effusion presented to the ED today with worsening dyspnea. Chest x-ray shows large right pleural effusion. Request IR for diagnostic and therapeutic thoracentesis. EXAM: ULTRASOUND GUIDED RIGHT THORACENTESIS MEDICATIONS: 8 mL 1% lidocaine COMPLICATIONS: None immediate. PROCEDURE: An ultrasound guided thoracentesis was thoroughly discussed with the patient and questions answered. The benefits, risks, alternatives and complications were also discussed. The patient understands and wishes to proceed with the procedure. Written consent was obtained. Ultrasound was performed to localize and mark an adequate pocket of fluid in the right chest. The area was then prepped and draped in the normal sterile fashion. 1% Lidocaine was used for local anesthesia. Under ultrasound guidance a 6 Fr Safe-T-Centesis catheter was introduced. Thoracentesis was performed. The catheter was removed and a dressing applied. FINDINGS: A total of approximately 1.3 L of bloody fluid was removed. Samples were sent to the laboratory as requested by the clinical team. IMPRESSION: Successful ultrasound guided right thoracentesis yielding 1.3 L of pleural fluid. Read by Candiss Norse, PA-C Electronically Signed   By: Lucrezia Europe M.D.   On: 05/27/2019 17:07  Pathology:  CYTOLOGY - NON PAP  CASE: [id number]  PATIENT: Brian French  Non-Gynecological Cytology Report   Clinical History:  Specimen Submitted: A. ASCITES, PARACENTESIS:   FINAL MICROSCOPIC DIAGNOSIS:  - Malignant cells present   SPECIMEN ADEQUACY:  Satisfactory for evaluation   DIAGNOSTIC COMMENTS:  The malignant cells are positive for cytokeratin 7. They are negative  for cytokeratin 20, TTF-1. WT-1, and calretinin. This profile is  non-specific. Clinical and radiologic  correlation is necessary. Dr.  Vicente Males has reviewed the case and concurs with this interpretation.  Additional studies can be attempted upon request.   Assessment and Plan:   Metastatic cancer ?lung with possible brain metastasis -Imaging including CT scans and MRI have been reviewed -Cytology from prior thoracentesis and paracentesis have been reviewed which demonstrate malignant cells. -Primary site has not been identified but question ? Lung cancer. -He underwent recent bronchoscopy and pathology and cytology are currently pending. -Recommend consult from neurosurgery and radiation oncology for further recommendations regarding his possible brain metastasis. -Recommend outpatient follow-up at the [facility] in [place] for ongoing medical oncology evaluation and management.  He will need a PET scan at the [place] after discharge. -I attempted to call the patient's wife and was unable to contact her this afternoon.  AKI with hyperkalemia -Hyperkalemia has resolved and renal function improving. -Continue management per hospitalist.  Atrial fibrillation with RVR -Remains on diltiazem.  Eliquis has been placed on hold.  Diabetes mellitus -On Lantus and sliding scale insulin per hospitalist.  Medical oncology will sign off at this time.  He will need outpatient follow-up with his oncologist at the [facility].  Thank you for this referral.   Mikey Bussing, DNP, AGPCNP-BC, AOCNP

## 2019-06-03 NOTE — Progress Notes (Signed)
Report called to [facility] RN, patient transferred to [facility] via wheelchair.

## 2019-06-03 NOTE — Progress Notes (Signed)
Patient arrived to unit. 1 assist to bed by RN. Patient has PleurX catheter drainage supplies, PleurX catheter tube on right side (with dressing covering).   Cardiac monitoring initiated and verified.  Patient states he self caths for urine at home about once a day (every morning). 2W RN states it was done around 4pm today. Patient says he doesn't need to de cathed now, but we can check later. Will do bladder scan and I&O.

## 2019-06-03 NOTE — Progress Notes (Signed)
Nutrition Follow-up  DOCUMENTATION CODES:   Obesity unspecified  INTERVENTION:   -Ensure Enlive po BID, each supplement provides 350 kcal and 20 grams of protein  NUTRITION DIAGNOSIS:   Inadequate oral intake related to poor appetite as evidenced by per patient/family report.  Improving  GOAL:   Patient will meet greater than or equal to 90% of their needs  Progressing.  MONITOR:   PO intake, Supplement acceptance, Skin, Weight trends, Labs, I & O's  ASSESSMENT:   78 y.o. year old male with medical history significant for type 2 diabetes, HLD, HTN, A. fib on Eliquis, recent diagnosed malignant pleural effusion, recent hospitalization (10/15-10/19) for right-sided pneumonia complicated by parapneumonic malignant effusion based on cytology who presented on 05/27/2019 with worsening dyspnea on exertion and at rest x1 week with mild nonproductive cough.  **RD working remotely**  Patient's appetite has improved, currently consuming 50-100% of meals. Pt is drinking Ensure supplements.   Admission weight: 230 lbs. Current weight: 210 lbs.   I/Os:-5.4L since admit UOP: 3375 ml x 24 hrs  Medications reviewed. Labs reviewed: CBGs: 149-201  Diet Order:   Diet Order            Diet Carb Modified Fluid consistency: Thin; Room service appropriate? Yes  Diet effective now              EDUCATION NEEDS:   Not appropriate for education at this time  Skin:  Skin Assessment: Reviewed RN Assessment  Last BM:  Unknown  Height:   Ht Readings from Last 1 Encounters:  06/01/19 5\' 11"  (1.803 m)    Weight:   Wt Readings from Last 1 Encounters:  06/03/19 95.3 kg    Ideal Body Weight:  78.18 kg  BMI:  Body mass index is 29.3 kg/m.  Estimated Nutritional Needs:   Kcal:  2050-2250  Protein:  105-115 grams  Fluid:  >/= 2 L/day  Clayton Bibles, MS, RD, LDN Inpatient Clinical Dietitian Pager: [phone] After Hours Pager: [phone]

## 2019-06-03 NOTE — Progress Notes (Addendum)
PROGRESS NOTE   Mamadou Breon.  FBP:[id number]    DOB: 14-Dec-1940    DOA: 05/27/2019  PCP: [facility].   I have briefly reviewed patients previous medical records in [facility].  Chief Complaint  Patient presents with   Shortness of Breath    Brief Narrative:  78 year old male with PMH of DM2, HTN, ED, A. fib on Eliquis, suspected dementia, chronic self catheterization, recently hospitalized 10/15-10/19 for suspected community-acquired pneumonia, parapneumonic effusion, acute pyelonephritis and A. fib.  During that admission he underwent thoracentesis by IR (10/16) which showed malignant cells in the pleural fluid (sample noted as ascitic sampling error).  He is followed by the [facility] and is currently under evaluation by oncology for possible cancer (unknown primary/family unable to state) but no definitive diagnosis has been made.  Presented with dyspnea and cough.  Chest x-ray concerning for masslike opacity in the right perihilar area with right pleural effusion.  PCCM consulted.  S/p EBUS, biopsy and right Pleurx catheter placement by PCCM on 11/17.  Hospital course complicated by A. fib with mild RVR and delirium.  MRI brain confirms suspected metastatic lesion with hemorrhage. Medical and Radiation Oncology consulted.   Assessment & Plan:   Principal Problem:   Pneumonia due to infectious agent Active Problems:   Atrial fibrillation with RVR (HCC)   Type 2 diabetes mellitus with hyperlipidemia (HCC)   Malignant pleural effusion   AKI (acute kidney injury) (HCC)   Hyperkalemia   Hilar mass   Hyponatremia   Metabolic acidosis, normal anion gap (NAG)   AF (paroxysmal atrial fibrillation) (HCC)   BPH (benign prostatic hyperplasia)   AAA (abdominal aortic aneurysm) (HCC)   Frontal mass of brain   Vasogenic brain edema (HCC)   Malignant right pleural effusion/masslike opacity in the right perihilar region/Suspected primary Lung Cancer  S/p thoracentesis  11/12 by IR followed by 11/16 by PCCM.  S/p EBUS with right Pleurx catheter insertion by PCCM on 11/17.  Pulmonology follow-up appreciated.  Recommend Pleurx to be drained 1000 mL daily.  Home health nurses to teach patient and wife regarding management of Pleurx catheter.  Follow-up with Dr. Noemi Chapel, PCCM on 11/23 at 10:45 AM.  blood cultures x2 and pleural fluid cultures negative, final report.  Completed 8 days course of cefepime, discontinue 11/19.  On reviewing cytology reports, "ascites" cytology on 10/16 was positive for malignant cells.  However on further careful review, discussion with Dr. Melina Copa, pathology, it appears that this was pleural fluid cytology and not ascitic fluid.  Pleural fluid cytology on 11/18 was negative for malignant cells.  Dr. Melina Copa also advised that EBUS biopsy from 11/17 is suggestive of cancer, final results pending.  Reviewed MRI brain x2 from 11/14 with neuro hospitalist.  He is concerned that this is highly suspicious for metastatic disease with hemorrhage.  He recommended CT head without contrast to further delineate.  He recommends no anticoagulation.  I discussed in detail with both medical and radiation oncology, await input.  I am concerned that he may not get timely follow-up at the [place] which may lead to rapid progression of his disease.  Acute respiratory failure with hypoxia  Suspected due to malignant pleural effusion and probable lung cancer.  Resolved.  Currently saturating in the high 90s on room air.  Right brain (frontal lobe) mass  Suspecting metastatic lesion with primary lung cancer.  Continue IV dexamethasone  Consulted medical and radiation oncology.  Await decision regarding XRT to brain and dexamethasone  dose and taper.  Acute metabolic encephalopathy/delirium  Likely multifactorial related to brain mass, steroids complicating underlying dementia, sleep deprivation..  As per spouse, patient has not slept in a couple days.     Delirium precautions.   Will add trazodone at bedtime and as needed IV Haldol for agitation.  Acute kidney injury complicating CKD stage IIIb  Creatinine had peaked to 1.8, down to 1.34.  Renal ultrasound without hydronephrosis.  Avoid nephrotoxic medications.  Continue to follow BMP.  Non-anion gap metabolic acidosis  Improved on sodium bicarbonate.  Bicarbonate up to 21.  Consider stopping bicarbonate soon.  Hyperkalemia  Unclear etiology.?  Type IV RTA.  Continue sodium bicarbonate.  S/p Lokelma 10 g x 2 doses.  Low potassium diet.  Resolved.  Continue to follow BMP in a.m.  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  Sodium has normalized.  Follow BMP.  Permanent A. fib with intermittent RVR  Was on Cardizem 30 mg every 6 hourly.  Controlled in the 90s but intermittently RVR in the 120s.  If has recurrent antibiotic then consider increasing Cardizem.  Anticoagulation on hold due to metastatic brain lesion with hemorrhage.  Increased and changed Cardizem to CD 180 mg daily.  Leukocytosis  Likely d/t steroids. Already on Cefepime.  Uncontrolled type II DM  Complicated by steroids.  Adjust insulins as needed and monitor closely.  Better compared to yesterday.  Continue current regimen.  Hyperlipidemia  Continue statins.  BPH  Reportedly self catheterizes at home.  Monitor for acute urinary retention.  3 cm infrarenal AAA Outpatient follow-up.  Body mass index is 29.3 kg/m.  Nutritional Status Nutrition Problem: Inadequate oral intake Etiology: poor appetite Signs/Symptoms: per patient/family report Interventions: Ensure Enlive (each supplement provides 350kcal and 20 grams of protein)  DVT prophylaxis: SCDs Code Status: Full Family Communication: Discussed in detail with patient spouse at bedside.  Updated care and answered questions. Disposition: To be determined   Consultants:  PCCM Medical and  radiation oncology  Procedures:  As above  Antimicrobials:  IV cefepime   Subjective: Patient denies complaints.  Discussed with spouse at bedside who has been by his bedside since yesterday.  Reportedly does okay in the daytime with worsening confusion in the evening/night's.  Has not slept for a couple of days.  She feels that he will do better at home.  Has lost significant weight.  Objective:  Vitals:   06/02/19 1851 06/02/19 2354 06/03/19 0500 06/03/19 0800  BP: (!) 107/52 121/64  118/62  Pulse:      Resp:  16    Temp:  98.2 F (36.8 C)  98 F (36.7 C)  TempSrc:  Oral  Oral  SpO2:  96%  95%  Weight:   95.3 kg   Height:        Examination:  General exam: Pleasant elderly male lying comfortably propped up in bed.  Intermittently sipping coffee given by his spouse. Respiratory system: Clear to auscultation except mildly reduced breath sounds in right base. Respiratory effort normal. Cardiovascular system: S1 and S2 heard, irregularly irregular.  No JVD, murmurs or pedal edema.  Telemetry personally reviewed: Ventricular rate is better controlled, ranging in the 90s-100s. Gastrointestinal system: Abdomen is nondistended, soft and nontender. No organomegaly or masses felt. Normal bowel sounds heard. Central nervous system: Alert and oriented to person, spouse, not to place or time. No focal neurological deficits. Extremities: Symmetric 5 x 5 power. Skin: No rashes, lesions or ulcers Psychiatry: Judgement and insight  impaired. Mood & affect flat.     Data Reviewed: I have personally reviewed following labs and imaging studies   CBC: Recent Labs  Lab 05/29/19 0428 05/30/19 0446 05/31/19 0038 06/01/19 0214 06/02/19 0742  WBC 29.6* 25.9* 34.8* 33.0* 43.6*  NEUTROABS  --   --   --  25.5*  --   HGB 11.4* 11.1* 11.3* 10.6* 11.5*  HCT 34.1* 33.8* 34.2* 31.7* 34.0*  MCV 87.7 89.2 89.8 88.3 87.4  PLT 208 250 321 276 242    Basic Metabolic Panel: Recent Labs  Lab  05/30/19 1347  05/31/19 0038  06/01/19 0214  06/01/19 1810 06/01/19 2147 06/02/19 0043 06/02/19 0742 06/03/19 0651  NA 131*  --  130*  --  133*  --   --   --   --  136 137  K 5.7*   < > 5.6*   < > 5.5*   < > 5.8* 5.1 5.3* 5.3* 4.5  CL 104  --  101  --  105  --   --   --   --  105 105  CO2 13*  --  14*  --  17*  --   --   --   --  20* 21*  GLUCOSE 316*  --  385*  --  150*  --   --   --   --  205* 137*  BUN 46*  --  49*  --  55*  --   --   --   --  47* 42*  CREATININE 1.61*  --  1.80*  --  1.69*  --   --   --   --  1.57* 1.34*  CALCIUM 8.1*  --  8.3*  --  8.7*  --   --   --   --  9.0 8.2*  MG  --   --   --   --  2.5*  --   --   --   --   --   --   PHOS  --   --   --   --  2.8  --   --   --   --   --   --    < > = values in this interval not displayed.    Liver Function Tests: Recent Labs  Lab 06/01/19 0214  ALBUMIN 2.2*    CBG: Recent Labs  Lab 06/02/19 1227 06/02/19 1634 06/02/19 2124 06/03/19 0734 06/03/19 1224  GLUCAP 257* 415* 155* 149* 201*    Recent Results (from the past 240 hour(s))  SARS CORONAVIRUS 2 (TAT 6-24 HRS) Nasopharyngeal Nasopharyngeal Swab     Status: None   Collection Time: 05/27/19 11:35 AM   Specimen: Nasopharyngeal Swab  Result Value Ref Range Status   SARS Coronavirus 2 NEGATIVE NEGATIVE Final    Comment: (NOTE) SARS-CoV-2 target nucleic acids are NOT DETECTED. The SARS-CoV-2 RNA is generally detectable in upper and lower respiratory specimens during the acute phase of infection. Negative results do not preclude SARS-CoV-2 infection, do not rule out co-infections with other pathogens, and should not be used as the sole basis for treatment or other patient management decisions. Negative results must be combined with clinical observations, patient history, and epidemiological information. The expected result is Negative. Fact Sheet for Patients: SugarRoll.be Fact Sheet for Healthcare  Providers: https://www.woods-mathews.com/ This test is not yet approved or cleared by the [place] FDA and  has been authorized for detection and/or diagnosis of SARS-CoV-2 by FDA  under an Emergency Use Authorization (EUA). This EUA will remain  in effect (meaning this test can be used) for the duration of the COVID-19 declaration under Section 56 4(b)(1) of the Act, 21 U.S.C. section 360bbb-3(b)(1), unless the authorization is terminated or revoked sooner. Performed at [facility], [place] [address], [place], [place] [address]   Urine culture     Status: Abnormal   Collection Time: 05/27/19 11:37 AM   Specimen: Urine, Random  Result Value Ref Range Status   Specimen Description URINE, RANDOM  Final   Special Requests   Final    NONE Performed at [facility], [place] [address], [place], [place] [address]    Culture >=100,000 COLONIES/mL YEAST (A)  Final   Report Status 05/28/2019 FINAL  Final  Blood culture (routine x 2)     Status: None   Collection Time: 05/27/19 12:00 PM   Specimen: BLOOD  Result Value Ref Range Status   Specimen Description BLOOD RIGHT ANTECUBITAL  Final   Special Requests   Final    BOTTLES DRAWN AEROBIC AND ANAEROBIC Blood Culture adequate volume   Culture   Final    NO GROWTH 5 DAYS Performed at [facility], [place] [address] [place], [place] [address]    Report Status 06/01/2019 FINAL  Final  Blood culture (routine x 2)     Status: None   Collection Time: 05/27/19  1:18 PM   Specimen: BLOOD RIGHT HAND  Result Value Ref Range Status   Specimen Description BLOOD RIGHT HAND  Final   Special Requests   Final    BOTTLES DRAWN AEROBIC AND ANAEROBIC Blood Culture results may not be optimal due to an inadequate volume of blood received in culture bottles   Culture   Final    NO GROWTH 5 DAYS Performed at [facility], [place] [address], [place], [place] [address]    Report Status 06/01/2019 FINAL  Final  Gram stain      Status: None   Collection Time: 05/27/19  5:10 PM   Specimen: PATH Cytology Pleural fluid  Result Value Ref Range Status   Specimen Description FLUID PLEURAL  Final   Special Requests NONE  Final   Gram Stain   Final    RARE WBC PRESENT, PREDOMINANTLY PMN NO ORGANISMS SEEN Performed at [facility], [place] [address] [place], [place] [address]    Report Status 05/28/2019 FINAL  Final  Culture, body fluid-bottle     Status: None   Collection Time: 05/27/19  5:10 PM   Specimen: Fluid  Result Value Ref Range Status   Specimen Description FLUID PLEURAL  Final   Special Requests BOTTLES DRAWN AEROBIC AND ANAEROBIC  Final   Culture   Final    NO GROWTH 5 DAYS Performed at [facility], [place] [address], [place], [place] [address]    Report Status 06/01/2019 FINAL  Final      Radiology Studies: Ct Head Wo Contrast  Result Date: 06/03/2019 CLINICAL DATA:  Follow-up possible metastasis on MRI EXAM: CT HEAD WITHOUT CONTRAST TECHNIQUE: Contiguous axial images were obtained from the base of the skull through the vertex without intravenous contrast. COMPARISON:  Correlation made with prior MR imaging FINDINGS: Brain: In the region of the right frontal lesion on MRI, there is some ill-defined hypoattenuation. No mass effect or acute hemorrhage. There is no extra-axial fluid collection. Ventricles and sulci are mildly prominent, reflecting parenchymal volume loss. Patchy and  confluent areas of hypoattenuation in the supratentorial white matter are nonspecific but may reflect mild to moderate chronic microvascular ischemic changes. Vascular: There is minor intracranial atherosclerotic calcification at the skull base. Skull: Calvarium is unremarkable. Sinuses/Orbits: No acute finding. Other: None. IMPRESSION: Some ill-defined hypoattenuation in the region of right frontal lesion on recent prior MRI. There is no mass effect or hemorrhage. Followup MR imaging is recommended over a greater  interval. Electronically Signed   By: Macy Mis M.D.   On: 06/03/2019 14:28   Dg Chest Port 1 View  Result Date: 06/01/2019 CLINICAL DATA:  Chest tube EXAM: PORTABLE CHEST 1 VIEW COMPARISON:  05/31/2019 FINDINGS: Interval placement of PleurX on the right. No visible pneumothorax. Continued suspected layering right effusion. Right infrahilar atelectasis again noted. Left lung clear. Heart is upper limits normal in size. No acute bony abnormality lateral left 6th rib fracture, chronic. IMPRESSION: Interval placement of right PleurX catheter. No pneumothorax. Probable continued layering right effusion with right infrahilar atelectasis. Electronically Signed   By: Rolm Baptise M.D.   On: 06/01/2019 19:18          Scheduled Meds:  aspirin EC  81 mg Oral Daily   atorvastatin  80 mg Oral q1800   dexamethasone (DECADRON) injection  4 mg Intravenous Q24H   diltiazem  180 mg Oral Daily   feeding supplement (ENSURE ENLIVE)  237 mL Oral BID BM   guaiFENesin  600 mg Oral BID   insulin aspart  0-15 Units Subcutaneous TID WC   insulin aspart  0-5 Units Subcutaneous QHS   insulin glargine  15 Units Subcutaneous BID   pantoprazole  40 mg Oral Daily   sodium bicarbonate  1,300 mg Oral BID   tamsulosin  0.4 mg Oral Daily   Continuous Infusions:  ceFEPime (MAXIPIME) IV 2 g (06/03/19 1512)   lactated ringers 10 mL/hr at 06/02/19 0249     LOS: 7 days     Vernell Leep, MD, [place], [facility]. [facility]  To contact the attending provider between 7A-7P or the covering provider during after hours 7P-7A, please log into the web site [email or web address] and access using universal [place] password for that web site. If you do not have the password, please call the hospital operator.  06/03/2019, 4:32 PM

## 2019-06-03 NOTE — Progress Notes (Signed)
[facility] prescription form for PleurX supplies signed by provider and will be faxed and mailed to [facility] today.

## 2019-06-03 NOTE — Progress Notes (Signed)
Day 8 of antibiotics  Discontinue cefepime

## 2019-06-03 NOTE — TOC Progression Note (Addendum)
Transition of Care (TOC) - Progression Note    Patient Details  Name: Brian [place]. MRN: [id number] Date of Birth: August 22, 1940  Transition of Care [facility]) CM/SW Contact  Maryclare Labrador, RN Phone Number: 06/03/2019, 10:55 AM  Clinical Narrative:   [facility] reviewed order written today with specifics of drainage of pleurx catheter - [facility] continues to accept referral pending auth.  [facility] informed CM that they have not received auth as of yet for [facility].  CM reached out to [place] aware that pt is pending discharge.  [facility] also informed CM that plerurx drains have already been order by the [facility] via their pharmacy and therefore [facility] packet is not required - per [facility] ple[facility]x catheters will arrive at pts home within a week from yesterday.  [facility] will inform CM once auth is obtained.  Also, RW and tub bench has already been ordered by [facility] PCP.           Expected Discharge Plan and Services                                                 Social Determinants of Health (SDOH) Interventions    Readmission Risk Interventions Readmission Risk Prevention Plan 06/01/2019  Transportation Screening Complete  Medication Review ([place]) Complete  PCP or Specialist appointment within 3-5 days of discharge (No Data)  Some recent data might be hidden

## 2019-06-03 NOTE — Progress Notes (Signed)
Sitter at the bedside. Previously explained to wife the medical necessity of having a safety sitter to keep pt from leaving bed (pt's ability to be reoriented is very limited), pulling lines/tubes, to make sure pt is not pulling PIV's - wife verbalized understanding. Wife states that "I need to put him somewhere, because I can't deal with this anymore". Pt asked wife to hold his hand. Wife refused. Discussed with wife importance of self-care and caregiver burnout.

## 2019-06-03 NOTE — Progress Notes (Signed)
NAME:  Brian French., MRN:  [id number], DOB:  09-15-1940, LOS: 7 ADMISSION DATE:  05/27/2019, CONSULTATION DATE:  11/13 REFERRING MD:  Dr. Teryl Lucy, CHIEF COMPLAINT:  SOB, cough   Brief History   78 y/o M admitted 11/12 with worsening dyspnea and cough.   History of present illness   78 y/o M who presented to [facility] on 11/12 with reports of decreased appetite, dizziness, abdominal pain and shortness of breath.    He was admitted to the hospital 10/15-10/19 for acute pyelonephritis & suspected right sided PNA.   During that admission, he underwent a thoracentesis per IR (10/16) with malignant cells in pleural fluid. He is followed by the [facility] and is currently under evaluation by [facility] for possible cancer (unknown primary / family could not state) but no definitive diagnosis has been made.  His wife indicates he has been having trouble with his memory for approximately 6 months and was started on Namenda.  She states he has been "saying things that are off".  She notes approximately 20lbs in the last few months.   ER evaluation found him to be in [facility], tachypneic requiring O2.  CXR showed concerning for enlarged right pleural effusion.  Initial labs notable for hyponatremia (126), hyperkalemia (6.1), CO2 17, BUN 50 / Sr Cr 2.33 (up from 1.85 10/24), albumin 2.5, WBC 34.2 and platelets 258.   Subsequent CT imaging of the chest demonstrated an increase in the right pleural effusion with compressive atelectasis, persistent enlarged subcarinal lymph nodes, RUL nodular density.   PCCM consulted for evaluation of malignant effusion.   Past Medical History  DM  HLD - lipitor  HTN AF - on eliquis, ASA, cardizem Prostate Cancer - self caths  Former [facility]   11/12 Admit   Consults:  PCCM   Procedures:  05/27/2019 thoracentesis right with one-point liters per interventional radiology 05/31/2019 thoracentesis left with 700 cc of murky fluid removed 06/01/2019 EBUS  with right Pleurx catheter insertion Significant Diagnostic Tests:  ECHO 10/16 >> LVEF 50-55%, mild LVH, impaired diastolic filling, global RV systolic function normal, LA/RA size normal, trivial pericardial effusion, trace MV regurgitation  Micro Data:  COVID 11/12 >> negative  Pleural Fluid GS 11/12 >. Negative  UC 11/12 >> 100k yeast  BCx2 11/12 >> pending 06/02/2019 Pleural Fluid Culture 11/12 >> negative  Antimicrobials:  Cefepime 11/12 >>  Vanco 11/12 >> off  Interim history/subjective:  Pt denies acute complaints.  States he can't remember if he has had any work up at the [place] for cancer.   Objective   Blood pressure 121/64, pulse 98, temperature 98.2 F (36.8 C), temperature source Oral, resp. rate 16, height 5\' 11"  (1.803 m), weight 95.3 kg, SpO2 96 %.        Intake/Output Summary (Last 24 hours) at 06/03/2019 1021 Last data filed at 06/02/2019 2300 Gross per 24 hour  Intake 480 ml  Output 2000 ml  Net -1520 ml   Filed Weights   06/01/19 0254 06/01/19 1354 06/03/19 0500  Weight: 99.1 kg 99.1 kg 95.3 kg    Examination: General: Well-nourished well-developed male sitting up eating HEENT: MM pink/moist Neuro: Remains confused but better than last 24 hours.  Aware of status in the hospital and not what city he is in. CV: Sounds regular PULM: Diminished in the bases GI: soft, bsx4 active  Extremities: warm/dry, 1+ edema  Skin: no rashes or lesions     Resolved Hospital Problem list  Assessment & Plan:   Malignant Pleural Effusion  Former Smoker  Trivial Pericardial Effusion  Weight Loss  Hyponatremia  Recent Labs  Lab 06/01/19 0214 06/02/19 0742 06/03/19 0651  NA 133* 136 137   MRI shows right frontal lobe lesion with stellate enhancement.  Favor metastatic disease. 05/31/2019 pleural fluid shows no malignant cells 06/01/2019 pathology is pending from EBUS>> Status post thoracentesis per Dr. Tamala Julian Status post EBUS and Pleurx catheter  insertion per Dr. Carlis Abbott Right Pleurx is to be drained 1000 cc daily, order reported to do it today in the hospital and home health will do it as an outpatient There is a follow-up appoint with Dr. Noemi Chapel on Monday, 23 November at 10:45 AM Questionable if you will be discharged in time to make that appointment.  If not a new appointment will be established  AKI Lab Results  Component Value Date   CREATININE 1.34 (H) 06/03/2019   CREATININE 1.57 (H) 06/02/2019   CREATININE 1.69 (H) 06/01/2019    Per primary  AF  Per primary  Best practice:  Diet: Per primary  DVT prophylaxis: hold apixaban, change to heparin sq GI prophylaxis: PPI  Glucose control: SSI  Mobility: as tolerated  Code Status: Full Code  Family Communication: 12/01/2018 wife and patient updated at bedside.  Patient seems more alert  today he knows he is in the hospital but he thinks he is in [facility]. Disposition: Per Primary  Labs   CBC: Recent Labs  Lab 05/27/19 1200  05/29/19 0428 05/30/19 0446 05/31/19 0038 06/01/19 0214 06/02/19 0742  WBC 34.2*   < > 29.6* 25.9* 34.8* 33.0* 43.6*  NEUTROABS 24.6*  --   --   --   --  25.5*  --   HGB 13.2   < > 11.4* 11.1* 11.3* 10.6* 11.5*  HCT 40.3   < > 34.1* 33.8* 34.2* 31.7* 34.0*  MCV 89.2   < > 87.7 89.2 89.8 88.3 87.4  PLT 258   < > 208 250 321 276 279   < > = values in this interval not displayed.    Basic Metabolic Panel: Recent Labs  Lab 05/30/19 1347  05/31/19 0038  06/01/19 0214  06/01/19 1810 06/01/19 2147 06/02/19 0043 06/02/19 0742 06/03/19 0651  NA 131*  --  130*  --  133*  --   --   --   --  136 137  K 5.7*   < > 5.6*   < > 5.5*   < > 5.8* 5.1 5.3* 5.3* 4.5  CL 104  --  101  --  105  --   --   --   --  105 105  CO2 13*  --  14*  --  17*  --   --   --   --  20* 21*  GLUCOSE 316*  --  385*  --  150*  --   --   --   --  205* 137*  BUN 46*  --  49*  --  55*  --   --   --   --  47* 42*  CREATININE 1.61*  --  1.80*  --   1.69*  --   --   --   --  1.57* 1.34*  CALCIUM 8.1*  --  8.3*  --  8.7*  --   --   --   --  9.0 8.2*  MG  --   --   --   --  2.5*  --   --   --   --   --   --   PHOS  --   --   --   --  2.8  --   --   --   --   --   --    < > = values in this interval not displayed.   GFR: Estimated Creatinine Clearance: 54.4 mL/min (A) (by C-G formula based on SCr of 1.34 mg/dL (H)). Recent Labs  Lab 05/27/19 1205 05/27/19 1414  05/29/19 0428 05/30/19 0446 05/31/19 0038 06/01/19 0214 06/02/19 0742  PROCALCITON  --   --   --  0.55 0.41 0.38  --   --   WBC  --   --    < > 29.6* 25.9* 34.8* 33.0* 43.6*  LATICACIDVEN 1.8 1.5  --   --   --   --   --   --    < > = values in this interval not displayed.    Liver Function Tests: Recent Labs  Lab 05/27/19 1200 06/01/19 0214  AST 15  --   ALT 16  --   ALKPHOS 99  --   BILITOT 0.7  --   PROT 7.1  --   ALBUMIN 2.5* 2.2*   Recent Labs  Lab 05/27/19 1200  LIPASE 30   No results for input(s): AMMONIA in the last 168 hours.  ABG No results found for: PHART, PCO2ART, PO2ART, HCO3, TCO2, ACIDBASEDEF, O2SAT   Coagulation Profile: No results for input(s): INR, PROTIME in the last 168 hours.  Cardiac Enzymes: No results for input(s): CKTOTAL, CKMB, CKMBINDEX, TROPONINI in the last 168 hours.  HbA1C: Hgb A1c MFr Bld  Date/Time Value Ref Range Status  04/30/2019 05:43 AM 9.8 (H) 4.8 - 5.6 % Final    Comment:    (NOTE) Pre diabetes:          5.7%-6.4% Diabetes:              >6.4% Glycemic control for   <7.0% adults with diabetes     CBG: Recent Labs  Lab 06/02/19 0725 06/02/19 1227 06/02/19 1634 06/02/19 2124 06/03/19 [place]     Steve Minor ACNP Maryanna Shape PCCM

## 2019-06-03 NOTE — Progress Notes (Signed)
Pt was found to have pulled off all leads, removed o2 sensor, taken out PIV and dismantled it into pieces - all pieces recovered intact - pt and bed covered in blood - former PIV site clean, dry, intact. Wife at the bedside, providing emotional support, but family is not appropriate to watch and enforce pt safety - wife is sleeping, and can only vaguely reorient patient, but keeps pt calm and non aggressive. Pg to on call.

## 2019-06-03 NOTE — Consult Note (Signed)
Radiation Oncology         [phone]) [phone] ________________________________  Name: Brian French.        MRN: [id number]  Date of Service: 06/03/2019 DOB: 1941/05/17  QV:ZDGLOV, [facility]  No ref. provider found     REFERRING PHYSICIAN: Dr. Algis Liming  DIAGNOSIS: The primary encounter diagnosis was Pneumonia of right lung due to infectious organism, unspecified part of lung. Diagnoses of Sepsis, due to unspecified organism, unspecified whether acute organ dysfunction present ([place]), AKI (acute kidney injury) ([place]), Malignant pleural effusion, Pleural effusion, Wheezing, Dyspnea and respiratory abnormalities, S/P thoracentesis, Pneumonia due to infectious agent, and Chest tube in place were also pertinent to this visit.   HISTORY OF PRESENT ILLNESS: Brian French. is a 78 y.o. male seen at the request of Dr. Algis Liming for a recently diagnosed lung cancer. The patient has a past medical history for dementia, atrial fibrillation on chronic anticoagulation who received his care at the [facility]. He was recently hospitalized in October with shortness of breath and was found to have shortness of breath and was diagnosed with a right pleural effusion and what appeared to be postobstructive pneumonia. He underwent a thoracentesis on 04/30/2019 revealing malignant cells though it was labeled as ascitic fluid all other notes indicate this was a pleural based specimen and pathology is reevaluating to confirm this. He was discharged and was planning to receive medical care at the [facility]. His wife states they were to go for a PET scan today and had a community care referral for imaging. I was able to speak with medical oncology nursing at the [facility] and they confirmed that no additional imaging was performed in their institution. He has foundation 1 pending. His wife also stated that since they would have to receive systemic therapy in [place] through the [place], they would prefer to keep his cancer care in  the community care setting. We've requested this comprehensive referral rather than just for imaging purposes. Dr. Clide Deutscher is the fellow at the [facility] seeing this patient and Dr. Neita Carp is his attending.   The patient was brought back to the [facility] ED on 05/27/2019 due to progressive shortness of breath, dizziness, generalized pain and decreased appetite. In the ED he underwent CXR that showed worsening aeration of the right lung compared to his post thoracentesis imaging from October. He also had a fib with RVR on EKG, AKI, and he had leukocytosis and was started on antibiotics for pneumonia under sepsis protocol. He underwent a thoracentesis on 05/27/2019 and cytology was negative. A CT scan on 05/28/2019 of the chest revealed a right upper lobe nodular density measuring 2.5 x 2 cm, and a large right effusion. He also had a 1 cm right paratracheal node, enlarged subcarinal nodes limited by lack of IV contrast, though estimated at 2.7 cm. He also had an MRI of the brain without contrast on 05/29/2019 which showed concern for a 1 cm hemorrhagic lesion in the right frontal lobe with edema. He had an MRI with contrast the same day that again showed a right frontal lobe lesion with stellate enhancement either metastatic disease versus subacute infarct. He underwent repeat thoracentesis at the bedside on 05/31/2019. He then went to the OR on 06/01/2019 for bronchoscopy/EBUS and pleurex catheter placement. Pathology and cytology are pending. He had a repeat CT of the head without contrast yesterday that again showed hyopattenuation in the region of the right frontal lesion seen on prior studies without acute findings. We were contacted to consider  palliative radiotherapy to the chest and recommendations for his brain findings.    PREVIOUS RADIATION THERAPY: No   PAST MEDICAL HISTORY:  Past Medical History:  Diagnosis Date   Dementia ([place])    Diabetes mellitus without complication ([place])    Headache disorder  05/22/2015   Hearing loss    bilateral - hearing aids   Hyperlipidemia    Hypertension        PAST SURGICAL HISTORY: Past Surgical History:  Procedure Laterality Date   CHEST TUBE INSERTION N/A 06/01/2019   Procedure: INSERTION PLEURAL DRAINAGE CATHETER;  Surgeon: Julian Hy, DO;  Location: [place];  Service: Thoracic;  Laterality: N/A;   IR THORACENTESIS ASP PLEURAL SPACE W/IMG GUIDE  04/30/2019   IR THORACENTESIS ASP PLEURAL SPACE W/IMG GUIDE  05/27/2019   right testicular swelling     had I and D after swelling s/p trauma from scrotal  swelling, got hit in the testes during judo [place] N/A 06/01/2019   Procedure: VIDEO BRONCHOSCOPY WITH ENDOBRONCHIAL ULTRASOUND;  Surgeon: Julian Hy, DO;  Location: [place];  Service: Thoracic;  Laterality: N/A;     FAMILY HISTORY:  Family History  Problem Relation Age of Onset   Cancer Mother    Heart disease Father      SOCIAL HISTORY:  reports that he quit smoking about 21 years ago. His smoking use included cigarettes. He smoked 2.00 packs per day. He has never used smokeless tobacco. He reports current alcohol use. He reports that he does not use drugs. The patient is married and lives in [place]. He is hard of hearing and has dementia requiring his wife to make medical decisions for him.   ALLERGIES: Patient has no known allergies.   MEDICATIONS:  Current Facility-Administered Medications  Medication Dose Route Frequency Provider Last Rate Last Dose   acetaminophen (TYLENOL) tablet 650 mg  650 mg Oral Q6H PRN Hongalgi, Anand D, MD       albuterol (PROVENTIL) (2.5 MG/3ML) 0.083% nebulizer solution 2.5 mg  2.5 mg Nebulization Q2H PRN Barb Merino, MD   2.5 mg at 05/31/19 0207   aspirin EC tablet 81 mg  81 mg Oral Daily Barb Merino, MD   81 mg at 06/03/19 0816   atorvastatin (LIPITOR) tablet 80 mg  80 mg Oral q1800 Barb Merino, MD   80 mg at 06/02/19 1851    dexamethasone (DECADRON) injection 4 mg  4 mg Intravenous Q24H Candee Furbish, MD   4 mg at 06/03/19 2025   diltiazem (CARDIZEM CD) 24 hr capsule 180 mg  180 mg Oral Daily Vernell Leep D, MD   180 mg at 06/03/19 1133   feeding supplement (ENSURE ENLIVE) (ENSURE ENLIVE) liquid 237 mL  237 mL Oral BID BM Oretha Milch D, MD   237 mL at 06/03/19 1517   guaiFENesin (MUCINEX) 12 hr tablet 600 mg  600 mg Oral BID Barb Merino, MD   600 mg at 06/03/19 2218   haloperidol lactate (HALDOL) injection 1 mg  1 mg Intravenous Q12H PRN Vernell Leep D, MD   1 mg at 06/04/19 0042   HYDROcodone-acetaminophen (NORCO/VICODIN) 5-325 MG per tablet 1 tablet  1 tablet Oral Q12H PRN Modena Jansky, MD   1 tablet at 06/03/19 2218   insulin aspart (novoLOG) injection 0-15 Units  0-15 Units Subcutaneous TID WC Dana Allan I, MD   5 Units at 06/03/19 1215   insulin aspart (novoLOG) injection 0-5 Units  0-5 Units Subcutaneous QHS Dana Allan I, MD   3 Units at 06/03/19 2218   insulin glargine (LANTUS) injection 15 Units  15 Units Subcutaneous BID Vernell Leep D, MD   15 Units at 06/04/19 0020   ipratropium-albuterol (DUONEB) 0.5-2.5 (3) MG/3ML nebulizer solution 3 mL  3 mL Nebulization Q4H PRN Bodenheimer, Charles A, NP   3 mL at 05/31/19 1140   lactated ringers infusion   Intravenous Continuous Ellender, Karyl Kinnier, MD   Stopped at 06/02/19 1421   pantoprazole (PROTONIX) EC tablet 40 mg  40 mg Oral Daily Barb Merino, MD   40 mg at 06/03/19 0071   sodium bicarbonate tablet 1,300 mg  1,300 mg Oral BID Dana Allan I, MD   1,300 mg at 06/03/19 2217   tamsulosin (FLOMAX) capsule 0.4 mg  0.4 mg Oral Daily Barb Merino, MD   0.4 mg at 06/03/19 0815   traZODone (DESYREL) tablet 50 mg  50 mg Oral QHS Modena Jansky, MD   50 mg at 06/03/19 2218     REVIEW OF SYSTEMS: On review of systems, the patient is hard of hearing and unable to contribute to the conversation so his consultation  was with his wife who is his surrogate decision maker. She states his breathing is improved with the catheter placement. He has intermittent numbness in his right hand but states this is stable. He has had a decreased appetite in the last few months and associated weight loss. He's lost about 20 pounds in the last month as well. No other complaints are verbalized.     PHYSICAL EXAM:  Wt Readings from Last 3 Encounters:  06/04/19 208 lb 3.2 oz (94.4 kg)  05/08/19 230 lb (104.3 kg)  04/30/19 230 lb 4.8 oz (104.5 kg)   Temp Readings from Last 3 Encounters:  06/04/19 97.7 F (36.5 C) (Oral)  05/13/19 97.7 F (36.5 C) (Oral)  05/08/19 97.6 F (36.4 C) (Oral)   BP Readings from Last 3 Encounters:  06/04/19 116/70  05/13/19 135/74  05/08/19 131/65   Pulse Readings from Last 3 Encounters:  06/04/19 (!) 102  05/13/19 95  05/08/19 78   Pain Assessment Pain Score: 0-No pain/10 Unable to assess due to encounter type.   ECOG = 3  0 - Asymptomatic (Fully active, able to carry on all predisease activities without restriction)  1 - Symptomatic but completely ambulatory (Restricted in physically strenuous activity but ambulatory and able to carry out work of a light or sedentary nature. For example, light housework, office work)  2 - Symptomatic, <50% in bed during the day (Ambulatory and capable of all self care but unable to carry out any work activities. Up and about more than 50% of waking hours)  3 - Symptomatic, >50% in bed, but not bedbound (Capable of only limited self-care, confined to bed or chair 50% or more of waking hours)  4 - Bedbound (Completely disabled. Cannot carry on any self-care. Totally confined to bed or chair)  5 - Death   Eustace Pen MM, Creech RH, Tormey DC, et al. [phone]). "Toxicity and response criteria of the [facility]". [place] Oncol. 5 (6): 649-55    LABORATORY DATA:  Lab Results  Component Value Date   WBC 43.6 (H) 06/02/2019    HGB 11.5 (L) 06/02/2019   HCT 34.0 (L) 06/02/2019   MCV 87.4 06/02/2019   PLT 279 06/02/2019   Lab Results  Component Value Date   NA 136 06/04/2019  K 4.3 06/04/2019   CL 101 06/04/2019   CO2 22 06/04/2019   Lab Results  Component Value Date   ALT 16 05/27/2019   AST 15 05/27/2019   ALKPHOS 99 05/27/2019   BILITOT 0.7 05/27/2019      RADIOGRAPHY: Ct Head Wo Contrast  Result Date: 06/03/2019 CLINICAL DATA:  Follow-up possible metastasis on MRI EXAM: CT HEAD WITHOUT CONTRAST TECHNIQUE: Contiguous axial images were obtained from the base of the skull through the vertex without intravenous contrast. COMPARISON:  Correlation made with prior MR imaging FINDINGS: Brain: In the region of the right frontal lesion on MRI, there is some ill-defined hypoattenuation. No mass effect or acute hemorrhage. There is no extra-axial fluid collection. Ventricles and sulci are mildly prominent, reflecting parenchymal volume loss. Patchy and confluent areas of hypoattenuation in the supratentorial white matter are nonspecific but may reflect mild to moderate chronic microvascular ischemic changes. Vascular: There is minor intracranial atherosclerotic calcification at the skull base. Skull: Calvarium is unremarkable. Sinuses/Orbits: No acute finding. Other: None. IMPRESSION: Some ill-defined hypoattenuation in the region of right frontal lesion on recent prior MRI. There is no mass effect or hemorrhage. Followup MR imaging is recommended over a greater interval. Electronically Signed   By: Macy Mis M.D.   On: 06/03/2019 14:28   Ct Chest Wo Contrast  Result Date: 05/28/2019 CLINICAL DATA:  History of metastatic non-small cell lung cancer. EXAM: CT CHEST WITHOUT CONTRAST TECHNIQUE: Multidetector CT imaging of the chest was performed following the standard protocol without IV contrast. COMPARISON:  CT angio chest 04/29/2019 FINDINGS: Cardiovascular: The heart size appears normal. There is a small  pericardial effusion. Stable to slightly decreased in volume from previous exam. Aortic atherosclerosis. Left main, lad, left circumflex and RCA coronary artery calcifications Mediastinum/Nodes: Normal appearance of the thyroid gland. The trachea appears patent and is midline. Normal appearance of the esophagus. 1 cm right paratracheal lymph node is identified, 5/3. Unchanged. Enlarged subcarinal lymph node is difficult to measure due to lack of IV contrast material and progressive right pleural effusion extending into the subcarinal region. The best estimate is this measures approximate 2.7 cm, image 79/3. Previously 3 cm. The hilar structures are suboptimally evaluated due to lack of IV contrast material. Lungs/Pleura: Large right pleural effusion is increased volume when compared with previous exam. There is persistent and progressive atelectasis of the right middle lobe. Presumed central obstructing lesion is difficult to identify to lack of IV contrast material. Rounded density within the right upper lobe appears smaller but progressively more solid measuring 2.5 x 2.0 cm, image 38/4. Previously 2.5 x 3.1 cm. Upper Abdomen: No acute abnormality identified. Enlarged left adrenal gland is partially imaged. Musculoskeletal: No chest wall mass or suspicious bone lesions identified. IMPRESSION: 1. Comparison to previous contrast enhanced CT of the chest from 05/08/2019 is significantly limited largely due to lack of IV contrast material. 2. Interval increase in volume of right pleural effusion. There is progressive atelectasis within the right middle lobe which may be postobstructive. Central right hilar lesion cannot be excluded. 3. Persistent enlarged subcarinal lymph node as noted on previous exam. Comparison with the prior study however is limited due to lack of IV contrast material. 4. Right upper lobe nodular density is smaller but more solid when compared with previous exam. This is nonspecific but may  represent an area progressive organizing pneumonia. Electronically Signed   By: Kerby Moors M.D.   On: 05/28/2019 07:22   Mr Brain Wo Contrast  Result  Date: 05/29/2019 CLINICAL DATA:  Combined small and non smell also lung cancer. Staging. EXAM: MRI HEAD WITHOUT CONTRAST TECHNIQUE: Multiplanar, multiecho pulse sequences of the brain and surrounding structures were obtained without intravenous contrast. COMPARISON:  MRI head 05/09/2015 FINDINGS: Brain: New lesion in the right frontal lobe with surrounding edema and mild hemorrhage. This is highly likely to be metastatic disease. Postcontrast imaging recommended to confirm. Multiple small white matter hyperintensities bilaterally are indeterminate. Small lesions in the right parietal subcortical white matter are suspicious for metastatic disease but contrast enhanced imaging is needed. Generalized atrophy.  Negative for acute infarct. Vascular: Normal arterial flow voids Skull and upper cervical spine: No focal skeletal abnormality. Sinuses/Orbits: Negative Other: Image quality degraded by motion. Limited evaluation for metastatic disease without intravenous contrast IMPRESSION: Hemorrhagic lesion right frontal lobe less than 1 cm with surrounding edema, highly suspicious for metastatic disease. Recommend postcontrast imaging for confirmation and evaluate for other possible lesions. Generalized atrophy.  No acute infarct. Electronically Signed   By: Franchot Gallo M.D.   On: 05/29/2019 11:13   Mr Brain W Contrast  Result Date: 05/29/2019 CLINICAL DATA:  Lung cancer rule out metastatic disease. EXAM: MRI HEAD WITH CONTRAST TECHNIQUE: Multiplanar, multiecho pulse sequences of the brain and surrounding structures were obtained with intravenous contrast. CONTRAST:  76mL GADAVIST GADOBUTROL 1 MMOL/ML IV SOLN COMPARISON:  Unenhanced MRI head 05/29/2019 FINDINGS: Right frontal lobe abnormality shows stellate enhancement. This area showed susceptibility suggestive  of hemorrhage with surrounding edema. The stellate pattern of enhancement is unusual for metastatic disease. No other enhancing lesions in the brain identified. Ventricle size normal.  No midline shift. IMPRESSION: Right frontal lobe lesion shows stellate enhancement. This pattern is unusual for metastatic disease. Given the susceptibility and surrounding edema, continue to favor metastatic disease however enhancing subacute infarct is a consideration and short-term MRI follow-up without and with contrast is recommended in 2-4 weeks. Electronically Signed   By: Franchot Gallo M.D.   On: 05/29/2019 13:06   US Renal  Result Date: 05/31/2019 CLINICAL DATA:  Acute kidney injury. EXAM: RENAL / URINARY TRACT ULTRASOUND COMPLETE COMPARISON:  CT 05/08/2019 FINDINGS: Right Kidney: Renal measurements: 10.7 x 4.6 x 4.4 cm = volume: 112.4 mL. Thinning of renal parenchyma with increased renal echogenicity. No hydronephrosis. Multiple (less than 10) cysts within the kidney, all less than 1 cm. No obvious intra or perirenal fluid collection. Left Kidney: Renal measurements: 14.0 x 7.9 x 6.0 cm = volume: 349 mL. Slight increased renal echogenicity. No hydronephrosis. Multiple (less than 10) cysts within the left kidney. Largest cyst superior medially measures 4.9 cm greatest dimension. No obvious intrarenal or perirenal fluid collection. Bladder: Appears normal for degree of bladder distention. Both ureteral jets are visualized. Other: None. IMPRESSION: 1. No obstructive uropathy. 2. Increased bilateral renal echogenicity with mild thinning of the right renal parenchyma suggesting chronic medical renal disease. 3. Bilateral renal cysts. Electronically Signed   By: Keith Rake M.D.   On: 05/31/2019 23:46   Dg Chest Port 1 View  Result Date: 06/01/2019 CLINICAL DATA:  Chest tube EXAM: PORTABLE CHEST 1 VIEW COMPARISON:  05/31/2019 FINDINGS: Interval placement of PleurX on the right. No visible pneumothorax. Continued  suspected layering right effusion. Right infrahilar atelectasis again noted. Left lung clear. Heart is upper limits normal in size. No acute bony abnormality lateral left 6th rib fracture, chronic. IMPRESSION: Interval placement of right PleurX catheter. No pneumothorax. Probable continued layering right effusion with right infrahilar atelectasis. Electronically Signed  By: Rolm Baptise M.D.   On: 06/01/2019 19:18   Dg Chest Port 1 View  Result Date: 05/31/2019 CLINICAL DATA:  Status post thoracentesis. EXAM: PORTABLE CHEST 1 VIEW COMPARISON:  May 30, 2019. FINDINGS: Stable cardiomediastinal silhouette. Right pleural effusion appears to be smaller. Right infrahilar atelectasis is noted. Left lung is clear. No pneumothorax is noted. Bony thorax is unremarkable. IMPRESSION: No pneumothorax status post right-sided thoracentesis. Right pleural effusion appears smaller. Electronically Signed   By: Marijo Conception M.D.   On: 05/31/2019 14:15   Dg Chest Port 1 View  Result Date: 05/30/2019 CLINICAL DATA:  Dyspnea, pleural effusion EXAM: PORTABLE CHEST 1 VIEW COMPARISON:  05/28/2019 FINDINGS: Slight interval increase in a moderate, layering right pleural effusion with associated atelectasis or consolidation. The left lung is normally aerated. Cardiomegaly. IMPRESSION: 1. Slight interval increase in a moderate, layering right pleural effusion with associated atelectasis or consolidation. 2.  The left lung is normally aerated. 3.  Cardiomegaly. Electronically Signed   By: Eddie Candle M.D.   On: 05/30/2019 14:58   Dg Chest Port 1 View  Result Date: 05/28/2019 CLINICAL DATA:  Shortness of breath. EXAM: PORTABLE CHEST 1 VIEW COMPARISON:  05/27/2019.  CT 05/28/2019.  04/29/2019. FINDINGS: Mediastinum unchanged. Stable cardiomegaly. Dense right middle lobe atelectatic changes again noted. Underlying mass lesion again cannot be excluded. Right pleural effusion has increased in size. Reference is made to  today's chest CT for further evaluation. No pneumothorax. IMPRESSION: 1. Persistent right middle lobe atelectasis. Underlying mass lesion cannot be excluded. 2. Progressive right pleural effusion. Reference is made to today's chest CT for further evaluation. Electronically Signed   By: Marcello Moores  Register   On: 05/28/2019 12:10   Dg Chest Port 1 View  Result Date: 05/27/2019 CLINICAL DATA:  Pleural effusion. EXAM: PORTABLE CHEST 1 VIEW COMPARISON:  Chest x-ray May 27, 2019 FINDINGS: The right-sided pleural effusion is smaller in the interval after thoracentesis. No pneumothorax. Centralized somewhat masslike opacities seen in the right perihilar region, better assessed on previous CT imaging. A left lower veins clear. The cardiomediastinal silhouette is stable. IMPRESSION: 1. Centralized somewhat masslike opacity in the right perihilar region was better assessed on previous CT imaging and is stable. 2. The right-sided pleural effusion is smaller in the interval after thoracentesis with no pneumothorax. Electronically Signed   By: Dorise Bullion III M.D   On: 05/27/2019 18:51   Dg Chest Portable 1 View  Result Date: 05/27/2019 CLINICAL DATA:  Patient brought in by wife stating patient c/o shortness of breath, decreased appetite, dizziness and pain all over. EXAM: PORTABLE CHEST 1 VIEW COMPARISON:  04/30/2019 and earlier exams. FINDINGS: There is significant increased opacity on the right when compared to the most recent prior chest radiograph. There is at least a moderate pleural effusion. Additional opacity right perihilar and lower lung consistent with atelectasis, pneumonia or a combination. This has also increased from the prior exam. Left lung remains clear.  No left pleural effusion. Cardiac silhouette is normal in size. No mediastinal or left hilar mass. Right hilum partly obscured. Skeletal structures are grossly intact. IMPRESSION: 1. Worsening aeration on the right compared to the most recent  prior study. Previously noted right pleural effusion has enlarged. There is increased opacity in the right perihilar and right lung base is consistent with pneumonia, atelectasis or a combination, with atelectasis favored. Electronically Signed   By: Lajean Manes M.D.   On: 05/27/2019 12:07   Ct Angio Abd/pel W And/or Wo  Contrast  Result Date: 05/08/2019 CLINICAL DATA:  Question ischemia or renal infarct, recent diagnosis of atrial fibrillation, recent abnormal CT with persistent right abdominal pain. EXAM: CTA ABDOMEN AND PELVIS WITHOUT AND WITH CONTRAST TECHNIQUE: Multidetector CT imaging of the abdomen and pelvis was performed using the standard protocol during bolus administration of intravenous contrast. Multiplanar reconstructed images and MIPs were obtained and reviewed to evaluate the vascular anatomy. CONTRAST:  91mL OMNIPAQUE IOHEXOL 350 MG/ML SOLN COMPARISON:  CT 04/29/2019 FINDINGS: VASCULAR Aorta: Mixture of calcified noncalcified atherosclerotic plaque throughout the abdominal aorta. There is fusiform abdominal aortic aneurysm measuring up to 3 cm in maximal diameter. No periaortic stranding. No occlusion or stenosis. Celiac: Ostial plaque results in mild narrowing. Minimal plaque within the proximal vessel without significant stenosis. No evidence of aneurysm, dissection or vasculitis. SMA: Ostial plaque results and mild narrowing. Minimal atheromatous plaque in the proximal vessel. No significant stenosis. No evidence of aneurysm, dissection or vasculitis. Renals: Single renal arteries bilaterally. Plaque results in at least moderate narrowing of the proximal right renal artery. Normal distal opacification. Ostial plaque results and mild narrowing of the left renal artery as well. IMA: IMA arises from the fusiform abdominal aortic aneurysm. No evidence of aneurysm, dissection or vasculitis. Inflow: No extension of the aneurysm into the proximal inflow. Calcified noncalcified plaque seen  throughout the inflow vessels. No aneurysm, dissection, vasculitis or significant stenosis. Proximal Outflow: Plaque noted in the common femoral arteries and at the level of the bifurcations without significant stenosis, aneurysm or dissection Veins: Venous phase imaging reveals no significant venous abnormality. Portal and hepatic veins are patent. Review of the MIP images confirms the above findings. NON-VASCULAR Lower chest: Redemonstration of a moderate right pleural effusion with adjacent areas of passive atelectasis in the right lung base. No abnormal pleural thickening or enhancement. Small volume pericardial effusion. Cardiac size is within normal limits. Hepatobiliary: No focal liver abnormality is seen. No gallstones, gallbladder wall thickening, or biliary dilatation. Pancreas: Fatty replacement of the pancreas. No pancreatic ductal dilatation or surrounding inflammatory changes. Spleen: Normal in size without focal abnormality. Adrenals/Urinary Tract: Stable appearance of the 1.6 cm left adrenal nodule. Right adrenal gland is unremarkable. Exophytic left renal cysts are similar to prior exam. There is moderate bilateral perinephric stranding and low-attenuation striations of the bilateral renal parenchyma. Ill-defined rounded hypoattenuating lesions involving both kidneys are suspicious for lobar nephronia or developing abscess, less likely infarct in the setting of additional inflammation and infectious features. Right extrarenal pelvis. Urothelial thickening of both collecting systems. Circumferential thickening of the urinary bladder with bladder wall trabeculations. Stomach/Bowel: Distal esophagus, stomach and duodenal sweep are unremarkable. No small bowel wall thickening or dilatation. No evidence of obstruction. A normal appendix is visualized. No colonic dilatation or wall thickening. Lymphatic: No suspicious or enlarged lymph nodes in the included lymphatic chains. Reproductive: Borderline  prostatomegaly. Other: No abdominopelvic free fluid or free gas. No bowel containing hernias. Small fat containing inguinal hernias. Musculoskeletal: Multilevel degenerative changes are present in the imaged portions of the spine. Findings most pronounced at L3-4 with a calcified disc bulge resulting in moderate canal stenosis. Features likely exacerbated by what appear to be congenitally short pedicles. No acute osseous abnormality or suspicious osseous lesion. IMPRESSION: VASCULAR 1. Aortic Atherosclerosis (ICD10-I70.0). 2. Ostial plaque results in mild narrowing of the celiac, SMA, and left renal artery origins. 3. Moderate segmental narrowing of the proximal right renal artery secondary to atheromatous plaque. 4. Infrarenal abdominal aortic aneurysm measuring up 3 cm. Recommend  followup by ultrasound in 3 years. This recommendation follows ACR consensus guidelines: White Paper of the ACR Incidental Findings Committee II on Vascular Findings. J Am Coll Radiol 2013; 10:789-794. Aortic aneurysm NOS (ICD10-I71.9) NON-VASCULAR 1. Bilateral perinephric and periureteral stranding with urothelial thickening, nephrographic striations and ill-defined rounded hypoattenuating lesions involving both kidneys, suspicious for lobar nephronia or developing abscess, less likely infarct given the additional features supporting an ascending urinary tract infection. 2. Circumferential thickening of the urinary bladder with bladder wall trabeculations, consistent with chronic outlet obstruction or neurogenic bladder. 3. Moderate right pleural effusion with adjacent areas of passive atelectasis in the right lung base. 4. Small volume pericardial effusion. These results were called by telephone at the time of interpretation on 05/08/2019 at 7:23 pm to provider [facility] , who verbally acknowledged these results. Electronically Signed   By: Lovena Le M.D.   On: 05/08/2019 19:24   Ir Thoracentesis Asp Pleural Space W/img  Guide  Result Date: 05/31/2019 INDICATION: Patient with history of AFib on Eliquis, recently diagnosed malignant pleural effusion presented to the ED today with worsening dyspnea. Chest x-ray shows large right pleural effusion. Request IR for diagnostic and therapeutic thoracentesis. EXAM: ULTRASOUND GUIDED RIGHT THORACENTESIS MEDICATIONS: 8 mL 1% lidocaine COMPLICATIONS: None immediate. PROCEDURE: An ultrasound guided thoracentesis was thoroughly discussed with the patient and questions answered. The benefits, risks, alternatives and complications were also discussed. The patient understands and wishes to proceed with the procedure. Written consent was obtained. Ultrasound was performed to localize and mark an adequate pocket of fluid in the right chest. The area was then prepped and draped in the normal sterile fashion. 1% Lidocaine was used for local anesthesia. Under ultrasound guidance a 6 Fr Safe-T-Centesis catheter was introduced. Thoracentesis was performed. The catheter was removed and a dressing applied. FINDINGS: A total of approximately 1.3 L of bloody fluid was removed. Samples were sent to the laboratory as requested by the clinical team. IMPRESSION: Successful ultrasound guided right thoracentesis yielding 1.3 L of pleural fluid. Read by Candiss Norse, PA-C Electronically Signed   By: Lucrezia Europe M.D.   On: 05/27/2019 17:07       IMPRESSION/PLAN: 1. Stage IV malignancy most likely lung cancer. Dr. Lisbeth Renshaw has reviewed this patient's case and agrees with the plans to follow up with his pathology and cytology specimens from his broch/EBUS. He appears to have stage IV disease due to his positive cytology from the 04/30/2019 thoracentesis, and still needs to complete his staging work up with an outpatient PET and repeat MRI. We will order this for a few weeks from now to follow up on the findings and proceed with a 3T scan for more clarity and in the setting of him possibly being a candidate for  SRS treatment if metastatic disease was identified. With his current situation, we would recommend a course of radiotherapy to the chest with palliative intent.  We discussed the risks, benefits, short, and long term effects of radiotherapy to the chest, and the patient's wife is interested in proceeding. I reviewed the delivery and logistics of radiotherapy and anticipates a course of 2 weeks of radiotherapy. He will have carelink transportation to [facility] tomorrow at 2pm and we will plan to begin his radiotherapy on Monday 06/07/2019 whether he is still inpt or if he is outpt at that time. We will order his PET when he is an outpatient and he will follow up with Dr. Maylon Peppers in medical oncology as he has a community care referral from  the [place]. His wife is given contact information to our clinic as well. Thank you very much for the opportunity to participate in the care of this patient. 2. Right frontal lobe lesion. As above we will follow up with him in the outpatient setting for repeat MRI of the brain with 3T imaging and SRS protocol in about 2 weeks. His wife is in agreement. He will continue dexamethasone. His current dose is 4 mg daily, and I've spoken with Dr. Algis Liming about either continuing this dose or tapering down to 2 mg if he's neurologically stable by discharge as well as him taking PPI for GI prophylaxis. We will manage this further when he returns to our attention following his MRI.  3. A. Fib, sepsis, AKI, IDD. We appreciate the care his hospitalist team is providing and will follow these issues expectantly.    In a visit lasting 70 minutes, greater than 50% of the time was spent by phone with the patient's wife discussing his case, and in floor time coordinating the patient's care.    Carola Rhine, PAC

## 2019-06-04 ENCOUNTER — Other Ambulatory Visit: Payer: Self-pay | Admitting: Radiation Therapy

## 2019-06-04 ENCOUNTER — Encounter (HOSPITAL_COMMUNITY): Payer: Self-pay | Admitting: Radiation Oncology

## 2019-06-04 ENCOUNTER — Other Ambulatory Visit: Payer: Self-pay | Admitting: Radiation Oncology

## 2019-06-04 ENCOUNTER — Ambulatory Visit
Admission: RE | Admit: 2019-06-04 | Discharge: 2019-06-04 | Disposition: A | Discharge status: Home / Self Care | Payer: Medicare Other | Source: Ambulatory Visit | Attending: Radiation Oncology | Admitting: Radiation Oncology

## 2019-06-04 ENCOUNTER — Other Ambulatory Visit: Payer: Self-pay

## 2019-06-04 DIAGNOSIS — C3490 Malignant neoplasm of unspecified part of unspecified bronchus or lung: Secondary | ICD-10-CM

## 2019-06-04 DIAGNOSIS — C3411 Malignant neoplasm of upper lobe, right bronchus or lung: Secondary | ICD-10-CM

## 2019-06-04 DIAGNOSIS — C7931 Secondary malignant neoplasm of brain: Secondary | ICD-10-CM

## 2019-06-04 LAB — GLUCOSE, CAPILLARY
Glucose-Capillary: 149 mg/dL — ABNORMAL HIGH (ref 70–99)
Glucose-Capillary: 250 mg/dL — ABNORMAL HIGH (ref 70–99)

## 2019-06-04 LAB — BASIC METABOLIC PANEL
Anion gap: 13 (ref 5–15)
BUN: 40 mg/dL — ABNORMAL HIGH (ref 8–23)
CO2: 22 mmol/L (ref 22–32)
Calcium: 8.2 mg/dL — ABNORMAL LOW (ref 8.9–10.3)
Chloride: 101 mmol/L (ref 98–111)
Creatinine, Ser: 1.38 mg/dL — ABNORMAL HIGH (ref 0.61–1.24)
GFR calc Af Amer: 57 mL/min — ABNORMAL LOW (ref >60)
GFR calc non Af Amer: 49 mL/min — ABNORMAL LOW (ref >60)
Glucose, Bld: 165 mg/dL — ABNORMAL HIGH (ref 70–99)
Potassium: 4.3 mmol/L (ref 3.5–5.1)
Sodium: 136 mmol/L (ref 135–145)

## 2019-06-04 LAB — MRSA PCR SCREENING: MRSA by PCR: NEGATIVE

## 2019-06-04 MED ORDER — DEXAMETHASONE 4 MG PO TABS: 4.0000 mg | ORAL_TABLET | Freq: Every day | ORAL | 0 refills | Status: AC

## 2019-06-04 MED ORDER — DEXAMETHASONE 4 MG PO TABS: 4.0000 mg | ORAL_TABLET | Freq: Every day | ORAL | 0 refills | Status: DC

## 2019-06-04 MED FILL — DEXAMETHASONE 4 MG TABLET: 4 | 30 days supply | Qty: 30 | Fill #0

## 2019-06-04 NOTE — Plan of Care (Signed)
  Problem: Education: Goal: Knowledge of General Education information will improve Description: Including pain rating scale, medication(s)/side effects and non-pharmacologic comfort measures Outcome: Adequate for Discharge   Problem: Clinical Measurements: Goal: Respiratory complications will improve Outcome: Adequate for Discharge   Problem: Activity: Goal: Risk for activity intolerance will decrease Outcome: Adequate for Discharge

## 2019-06-04 NOTE — Progress Notes (Signed)
Patient very confused, has removed heart monitor x2. Patient reoriented and PRN medication given.  Patient saying he does not understand why he's here and he needs to speak with a MD asap. RN updated patient on plan of care and educated patient on the things he's done. Patient was transferred from [facility] to [facility] and he believes he has switched hospitals and doesn't understand why they moved him form "[employer]". Patient now laying down after talking to RN. Will continue to monitor pt and assess need for sitter (patient had a sitter before he came to [place], but sitter did not come with him and order expired).

## 2019-06-04 NOTE — Progress Notes (Signed)
Bladder scan done >849  Straight cath= 1083mL

## 2019-06-04 NOTE — Progress Notes (Signed)
NAME:  Brian French., MRN:  [id number], DOB:  15-Oct-1940, LOS: 8 ADMISSION DATE:  05/27/2019, CONSULTATION DATE:  11/13 REFERRING MD:  Dr. Teryl Lucy, CHIEF COMPLAINT:  SOB, cough   Brief History   78 y/o M admitted 11/12 with worsening dyspnea and cough.   History of present illness   78 y/o M who presented to [facility] on 11/12 with reports of decreased appetite, dizziness, abdominal pain and shortness of breath.    He was admitted to the hospital 10/15-10/19 for acute pyelonephritis & suspected right sided PNA.   During that admission, he underwent a thoracentesis per IR (10/16) with malignant cells in pleural fluid. He is followed by the [place] and is currently under evaluation by ONC for possible cancer (unknown primary / family could not state) but no definitive diagnosis has been made.  His wife indicates he has been having trouble with his memory for approximately 6 months and was started on Namenda.  She states he has been "saying things that are off".  She notes approximately 20lbs weight loss  in the last few months.   ER evaluation found him to be in [facility], tachypneic requiring O2.  CXR showed concerning for enlarged right pleural effusion.  Initial labs notable for hyponatremia (126), hyperkalemia (6.1), CO2 17, BUN 50 / Sr Cr 2.33 (up from 1.85 10/24), albumin 2.5, WBC 34.2 and platelets 258.   Subsequent CT imaging of the chest demonstrated an increase in the right pleural effusion with compressive atelectasis, persistent enlarged subcarinal lymph nodes, RUL nodular density.   PCCM consulted for evaluation of malignant effusion.   Past Medical History  DM  HLD - lipitor  HTN AF - on eliquis, ASA, cardizem Prostate Cancer - self caths  Former [facility] Events   11/12 Admit   Consults:  PCCM   Procedures:  05/27/2019 thoracentesis right with one-point liters per interventional radiology 05/31/2019 thoracentesis left with 700 cc of murky fluid removed  06/01/2019 EBUS with right Pleurx catheter insertion Significant Diagnostic Tests:  ECHO 10/16 >> LVEF 50-55%, mild LVH, impaired diastolic filling, global RV systolic function normal, LA/RA size normal, trivial pericardial effusion, trace MV regurgitation  Micro Data:  COVID 11/12 >> negative  Pleural Fluid GS 11/12 >. Negative  UC 11/12 >> 100k yeast  BCx2 11/12 >> pending 06/02/2019 Pleural Fluid Culture 11/12 >> negative  Antimicrobials:  Cefepime 11/12 >>  Vanco 11/12 >> off  Interim history/subjective:  Pt states he is doing well. Breathing better. He wants to go home. Plan is for eval radiation oncology He will be discharged today, then he will present to [facility] for markings this afternoon, and he will have his  first treatment Monday 11/23. Pt has follow up with pulmonary ( Dr. Carlis Abbott) 11/23 at 10:45. Then first radiation treatment 11/23 at 2 pm. Wife is aware of both and states he can manage. He will have home health as OP for Pleurex drainage  Objective   Blood pressure 104/61, pulse 87, temperature 97.8 F (36.6 C), temperature source Oral, resp. rate 20, height 6' (1.829 m), weight 94.4 kg, SpO2 96 %.        Intake/Output Summary (Last 24 hours) at 06/04/2019 1138 Last data filed at 06/04/2019 0844 Gross per 24 hour  Intake 313.63 ml  Output 3200 ml  Net -2886.37 ml   Filed Weights   06/03/19 0500 06/03/19 2321 06/04/19 0203  Weight: 95.3 kg 94.4 kg 94.4 kg    Examination: General:  Well-nourished well-developed, in NAD HEENT: MM pink/moist, NCAT, No LAD, No JVD Neuro: Less  confused but better than last 24 hours.   CV: S1, S2, RRR, No RMG PULM: Bilateral chest excursion, Clear throughout, diminished in the bases GI: soft, bsx4 active  Extremities: warm/dry, 1+ edema , no obvious deformities Skin: no rashes or lesions     Resolved Hospital Problem list      Assessment & Plan:   Malignant Pleural Effusion  Former Smoker Quit  1999 Trivial Pericardial Effusion  Weight Loss  Hyponatremia  Recent Labs  Lab 06/02/19 0742 06/03/19 0651 06/04/19 0306  NA 136 137 136   MRI shows right frontal lobe lesion with stellate enhancement.  Favor metastatic disease. 05/31/2019 pleural fluid shows no malignant cells 06/01/2019 pathology is pending from EBUS>> Status post thoracentesis per Dr. Tamala Julian Status post EBUS and Pleurx catheter insertion per Dr. Carlis Abbott Right Pleurx is to be drained 1000 cc daily, order reported to do it today in the hospital and home health will do it as an outpatient There is a follow-up appoint with Dr. Noemi Chapel on Monday, 23 November at 10:45 AM Plan is for discharge today and wife will take to [facility] for radiation marking today, then first treatment is Monday 11/23 at 2 pm.  Wife states she can make the 10:45 appointment at Pulmonary Monday prior. AKI Lab Results  Component Value Date   CREATININE 1.38 (H) 06/04/2019   CREATININE 1.34 (H) 06/03/2019   CREATININE 1.57 (H) 06/02/2019    Per primary  AF  Per primary  Best practice:  Diet: Per primary  DVT prophylaxis: hold apixaban, change to heparin sq GI prophylaxis: PPI  Glucose control: SSI  Mobility: as tolerated  Code Status: Full Code  Family Communication: 12/02/2018 wife and patient updated at bedside.  Patient seems more alert  today . He is excited about going home and getting his treatment started. Disposition: Per Primary  Labs   CBC: Recent Labs  Lab 05/29/19 0428 05/30/19 0446 05/31/19 0038 06/01/19 0214 06/02/19 0742  WBC 29.6* 25.9* 34.8* 33.0* 43.6*  NEUTROABS  --   --   --  25.5*  --   HGB 11.4* 11.1* 11.3* 10.6* 11.5*  HCT 34.1* 33.8* 34.2* 31.7* 34.0*  MCV 87.7 89.2 89.8 88.3 87.4  PLT 208 250 321 276 389    Basic Metabolic Panel: Recent Labs  Lab 05/31/19 0038  06/01/19 0214  06/01/19 2147 06/02/19 0043 06/02/19 0742 06/03/19 0651 06/04/19 0306  NA 130*  --  133*  --   --   --  136 137 136  K  5.6*   < > 5.5*   < > 5.1 5.3* 5.3* 4.5 4.3  CL 101  --  105  --   --   --  105 105 101  CO2 14*  --  17*  --   --   --  20* 21* 22  GLUCOSE 385*  --  150*  --   --   --  205* 137* 165*  BUN 49*  --  55*  --   --   --  47* 42* 40*  CREATININE 1.80*  --  1.69*  --   --   --  1.57* 1.34* 1.38*  CALCIUM 8.3*  --  8.7*  --   --   --  9.0 8.2* 8.2*  MG  --   --  2.5*  --   --   --   --   --   --  PHOS  --   --  2.8  --   --   --   --   --   --    < > = values in this interval not displayed.   GFR: Estimated Creatinine Clearance: 53.5 mL/min (A) (by C-G formula based on SCr of 1.38 mg/dL (H)). Recent Labs  Lab 05/29/19 0428 05/30/19 0446 05/31/19 0038 06/01/19 0214 06/02/19 0742  PROCALCITON 0.55 0.41 0.38  --   --   WBC 29.6* 25.9* 34.8* 33.0* 43.6*    Liver Function Tests: Recent Labs  Lab 06/01/19 0214  ALBUMIN 2.2*   No results for input(s): LIPASE, AMYLASE in the last 168 hours. No results for input(s): AMMONIA in the last 168 hours.  ABG No results found for: PHART, PCO2ART, PO2ART, HCO3, TCO2, ACIDBASEDEF, O2SAT   Coagulation Profile: No results for input(s): INR, PROTIME in the last 168 hours.  Cardiac Enzymes: No results for input(s): CKTOTAL, CKMB, CKMBINDEX, TROPONINI in the last 168 hours.  HbA1C: Hgb A1c MFr Bld  Date/Time Value Ref Range Status  04/30/2019 05:43 AM 9.8 (H) 4.8 - 5.6 % Final    Comment:    (NOTE) Pre diabetes:          5.7%-6.4% Diabetes:              >6.4% Glycemic control for   <7.0% adults with diabetes     CBG: Recent Labs  Lab 06/03/19 1633 06/03/19 2127 06/03/19 2323 06/04/19 0552 06/04/19 1125  GLUCAP 281* 291* 263* 149* 250*    Magdalen Spatz, MSN, AGACNP-BC [place] Pager # [phone] After 4 pm please call [phone] 06/04/2019 .11:39 AM

## 2019-06-04 NOTE — Care Management Important Message (Signed)
Important Message  Patient Details  Name: Brian French. MRN: [id number] Date of Birth: [birth year]   Medicare Important Message Given:  Yes     [name] 06/04/2019, 1:38 PM

## 2019-06-04 NOTE — Progress Notes (Signed)
Physical Therapy Treatment Patient Details Name: Brian French. MRN: [id number] DOB: 05-Aug-1940 Today's Date: 06/04/2019    History of Present Illness 78 y/o M who presented to [facility] on 11/12 with reports of decreased appetite, dizziness, abdominal pain & SOB. Recent admission 10/15-10/19 for acute pyelonephritis & suspected right sided PNA.  Found to be in [facility], tachypneic requiring O2.  CXR showed concerning for enlarged right pleural effusion.  Now s/p thoracentesis on 11/16 and bronchoscopy 11/17.  MRI positive for hemorrhagic lesion R frontal suspicious for metastatic disease.    PT Comments    Pt up in recliner upon arrival. Pt reporting rib pain t/o session limiting participation with pt stating he cant do much because of his ribs. Pt performed LE therex with min cuing for correct performance. Pt encouraged to perform therex throughout day on his own with pt verbalizing understanding. Pt performed transfers and ambulation with min guard A requiring cuing for technique during transfers. Pt SpO2 maintained upper 90s t/o session and HR upper 90s during ambulation. Pt presents with decreased strength, balance, and activity tolerance limiting functional mobility. Pt will benefit from continued skilled PT to improve deficits to decrease fall risk and improve independence.    Follow Up Recommendations  Home health PT;Supervision/Assistance - 24 hour     Equipment Recommendations  Rolling walker with 5" wheels    Recommendations for Other Services       Precautions / Restrictions Precautions Precautions: Fall Restrictions Weight Bearing Restrictions: No    Mobility  Bed Mobility               General bed mobility comments: deferred, pt in recliner upon arrival  Transfers Overall transfer level: Needs assistance Equipment used: Rolling walker (2 wheeled) Transfers: Sit to/from Stand Sit to Stand: Min guard         General transfer comment: x3 trials from recliner,  cuing for hand placement with little carryover, pt stands with forward flexed trunk, utilizes increased time and effort  Ambulation/Gait Ambulation/Gait assistance: Min guard Gait Distance (Feet): 25 Feet Assistive device: Rolling walker (2 wheeled) Gait Pattern/deviations: Step-through pattern;Decreased stride length Gait velocity: decreased   General Gait Details: pt ambulated in room, pt had one instance of unsteadiness navigating around corner of bed with RW with pt able to self correct with min guard A, Spo2 maintained upper 90s, HR upper 90s   Stairs             Wheelchair Mobility    Modified Rankin (Stroke Patients Only)       Balance Overall balance assessment: Needs assistance Sitting-balance support: Feet supported Sitting balance-Leahy Scale: Good Sitting balance - Comments: steady sitting edge of recliner performing LE therex     Standing balance-Leahy Scale: Fair Standing balance comment: reliant on UE support from RW during ambulation                            Cognition Arousal/Alertness: Awake/alert Behavior During Therapy: WFL for tasks assessed/performed Overall Cognitive Status: History of cognitive impairments - at baseline                                        Exercises Total Joint Exercises Long Arc Quad: AROM;Both;20 reps Marching in Standing: AROM;Both;10 reps;Seated;20 reps General Exercises - Lower Extremity Toe Raises: AROM;Both;10 reps Heel Raises: AROM;Both;20 reps  General Comments        Pertinent Vitals/Pain Pain Assessment: Faces Faces Pain Scale: Hurts little more Pain Location: ribs Pain Descriptors / Indicators: Sore Pain Intervention(s): Monitored during session    Home Living                      Prior Function            PT Goals (current goals can now be found in the care plan section) Progress towards PT goals: Progressing toward goals    Frequency    Min  3X/week      PT Plan Current plan remains appropriate    Co-evaluation              AM-PAC PT "6 Clicks" Mobility   Outcome Measure  Help needed turning from your back to your side while in a flat bed without using bedrails?: A Little Help needed moving from lying on your back to sitting on the side of a flat bed without using bedrails?: A Lot Help needed moving to and from a bed to a chair (including a wheelchair)?: A Little Help needed standing up from a chair using your arms (e.g., wheelchair or bedside chair)?: A Little Help needed to walk in hospital room?: A Little Help needed climbing 3-5 steps with a railing? : A Little 6 Click Score: 17    End of Session Equipment Utilized During Treatment: Gait belt Activity Tolerance: Patient tolerated treatment well Patient left: in chair;with call bell/phone within reach;with chair alarm set Nurse Communication: Mobility status PT Visit Diagnosis: Other abnormalities of gait and mobility (R26.89);Muscle weakness (generalized) (M62.81)     Time: 3007-6226 PT Time Calculation (min) (ACUTE ONLY): 20 min  Charges:  $Therapeutic Exercise: 8-22 mins                     Zachary George PT, DPT 9:35 AM,06/04/19    Kipton Skillen Drucilla Chalet 06/04/2019, 9:31 AM

## 2019-06-04 NOTE — Telephone Encounter (Signed)
Pt has appt on 11/23. No other actions needed at this time

## 2019-06-04 NOTE — Discharge Summary (Signed)
Physician Discharge Summary  Brian French. UXN:[id number] DOB: 11/07/1940 DOA: 05/27/2019  PCP: [facility], [facility]  Admit date: 05/27/2019 Discharge date: 06/04/2019  Admitted From: Home Disposition:  Home  Recommendations for Outpatient Follow-up:  1. Follow up with PCP in 1-2 weeks 2. Follow up with Oncology as scheduled 3. Follow up with Pulmonary as scheduled  Home Health:PT, RN  Discharge Condition:Stable CODE STATUS:Full Diet recommendation: Diabetic   Brief/Interim Summary: 78 year old male with PMH of DM2, HTN, ED, A. fib on Eliquis, suspected dementia, chronic self catheterization, recently hospitalized 10/15-10/19 for suspected community-acquired pneumonia, parapneumonic effusion, acute pyelonephritis and A. fib.  During that admission he underwent thoracentesis by IR (10/16) which showed malignant cells in the pleural fluid (sample noted as ascitic sampling error).  He is followed by the [facility] and is currently under evaluation by oncology for possible cancer (unknown primary/family unable to state) but no definitive diagnosis has been made.  Presented with dyspnea and cough.  Chest x-ray concerning for masslike opacity in the right perihilar area with right pleural effusion.  PCCM consulted.  S/p EBUS, biopsy and right Pleurx catheter placement by PCCM on 11/17.  Hospital course complicated by A. fib with mild RVR and delirium.  MRI brain confirms suspected metastatic lesion with hemorrhage. Medical and Radiation Oncology consulted.  Discharge Diagnoses:  Principal Problem:   Pneumonia due to infectious agent Active Problems:   Atrial fibrillation with RVR (HCC)   Type 2 diabetes mellitus with hyperlipidemia (HCC)   Malignant pleural effusion   AKI (acute kidney injury) (HCC)   Hyperkalemia   Hilar mass   Hyponatremia   Metabolic acidosis, normal anion gap (NAG)   AF (paroxysmal atrial fibrillation) (HCC)   BPH (benign prostatic hyperplasia)   AAA (abdominal  aortic aneurysm) (HCC)   Frontal mass of brain   Vasogenic brain edema (HCC)   Malignant neoplasm of lung ([place])  Malignant right pleural effusion/masslike opacity in the right perihilar region/Suspected primary Lung Cancer  S/p thoracentesis 11/12 by IR followed by 11/16 by PCCM.  S/p EBUS with right Pleurx catheter insertion by PCCM on 11/17.  Pulmonology follow-up appreciated.  Recommend Pleurx to be drained 1000 mL daily.  Home health nurses to teach patient and wife regarding management of Pleurx catheter.  Follow-up with Dr. Noemi Chapel, PCCM on 11/23 at 10:45 AM.  blood cultures x2 and pleural fluid cultures negative, final report.  Completed 8 days course of cefepime, discontinue 11/19.  On reviewing cytology reports, "ascites" cytology on 10/16 was positive for malignant cells.  However on further careful review, discussion with Dr. Melina Copa, pathology, it appears that this was pleural fluid cytology and not ascitic fluid.  Pleural fluid cytology on 11/18 was negative for malignant cells.  Dr. Melina Copa also advised that EBUS biopsy from 11/17 is suggestive of cancer, final results pending.  Reviewed MRI brain x2 from 11/14 with neuro hospitalist.  He is concerned that this is highly suspicious for metastatic disease with hemorrhage.  He recommended CT head without contrast to further delineate.  He recommends no anticoagulation.  Seen by Oncology and Radiation Oncology. Plan to start radiation tx 11/23  Acute respiratory failure with hypoxia  Suspected due to malignant pleural effusion and probable lung cancer.  Resolved.  Currently saturating in the high 90s on room air.  Right brain (frontal lobe) mass  Suspecting metastatic lesion with primary lung cancer.  Had been continued on IV dexamethasone while in hospital  Will continue pt on PO dexamethasone on discharge  Acute metabolic encephalopathy/delirium  Likely multifactorial related to brain mass, steroids  complicating underlying dementia, sleep deprivation..  As per spouse, patient has not slept in a couple days.    Mentation reportedly much improved per wife at bedside  Acute kidney injury complicating CKD stage IIIb  Creatinine had peaked to 1.8, down to 1.34.  Renal ultrasound without hydronephrosis.  Avoid nephrotoxic medications.  Non-anion gap metabolic acidosis  Improved on sodium bicarbonate.  Bicarbonate improved  Hyperkalemia  Unclear etiology.?  Type IV RTA.  Continue sodium bicarbonate.  S/p Lokelma 10 g x 2 doses.  Low potassium diet.  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  Sodium has normalized.  Permanent A. fib with intermittent RVR  Was on Cardizem 30 mg every 6 hourly.  Controlled in the 90s but intermittently RVR in the 120s.  If has recurrent antibiotic then consider increasing Cardizem.  Anticoagulation on hold due to metastatic brain lesion with hemorrhage.  Continued on cardizem  Leukocytosis  Likely d/t steroids. Completed abx  Uncontrolled type II DM  Complicated by steroids.  Stable while in hospital  Hyperlipidemia  Continue statins.  BPH  Reportedly self catheterizes at home.  3 cm infrarenal AAA Outpatient follow-up.  Discharge Instructions  Discharge Instructions    Pleural Drainage Schedule   Complete by: As directed    Pleural drainage schedule to start 06/03/2019. Drain daily, up to max of 1L until patient is only able to drain out 187ml. If <142ml for 3 consecutive drains then drain every other day. If <153ml for 3 consecutive drains every other day then call the practice that inserted the catheter for evaluation and possible removal. [facility] [phone]     Allergies as of 06/04/2019   No Known Allergies     Medication List    STOP taking these medications   apixaban 5 MG Tabs tablet Commonly known as: ELIQUIS   cyclobenzaprine 10  MG tablet Commonly known as: FLEXERIL   oxyCODONE 5 MG immediate release tablet Commonly known as: Oxy IR/ROXICODONE     TAKE these medications   acetaminophen 500 MG tablet Commonly known as: TYLENOL Take 1,000 mg by mouth every 6 (six) hours as needed for mild pain or headache.   aspirin EC 81 MG tablet Take 81 mg by mouth daily.   atorvastatin 80 MG tablet Commonly known as: LIPITOR Take 80 mg by mouth daily at 6 PM.   dexamethasone 4 MG tablet Commonly known as: DECADRON Take 1 tablet (4 mg total) by mouth daily.   diltiazem 120 MG 24 hr capsule Commonly known as: Cardizem CD Take 1 capsule (120 mg total) by mouth daily.   Fish Oil 1000 MG Caps Take 2,000 mg by mouth 2 (two) times daily.   glipiZIDE 10 MG tablet Commonly known as: GLUCOTROL Take 10 mg by mouth 2 (two) times daily before a meal.   HYDROcodone-acetaminophen 5-325 MG tablet Commonly known as: NORCO/VICODIN Take 1-2 tablets every 6 hours as needed for severe pain   insulin aspart protamine- aspart (70-30) 100 UNIT/ML injection Commonly known as: NOVOLOG MIX 70/30 Inject 20 Units into the skin daily.   lidocaine 5 % Commonly known as: Lidoderm Place 1 patch onto the skin daily. Remove & Discard patch within 12 hours or as directed by MD   memantine 10 MG tablet Commonly known as: NAMENDA Take 10 mg by mouth at bedtime.   metFORMIN 500 MG tablet Commonly known as: GLUCOPHAGE Take 500  mg by mouth 2 (two) times daily with a meal.   multivitamin with minerals Tabs tablet Take 1 tablet by mouth daily.   omeprazole 20 MG capsule Commonly known as: PRILOSEC Take 20 mg by mouth daily.   ondansetron 8 MG tablet Commonly known as: ZOFRAN Take 4 mg by mouth 3 (three) times daily as needed for nausea or vomiting (30 minutes before meals).   pioglitazone 45 MG tablet Commonly known as: ACTOS Take 45 mg by mouth daily.   senna 8.6 MG tablet Commonly known as: SENOKOT Take 1 tablet by mouth  daily.   tamsulosin 0.4 MG Caps capsule Commonly known as: FLOMAX Take 0.4 mg by mouth daily.      Follow-up Information    Julian Hy, DO Follow up on 06/07/2019.   Specialty: Pulmonary Disease Why: 10:45 am with Dr. Ozella Rocks information: [address] [place] [id number] [phone]          No Known Allergies  Consultations: PCCM Medical and radiation oncology  Procedures/Studies: Ct Head Wo Contrast  Result Date: 06/03/2019 CLINICAL DATA:  Follow-up possible metastasis on MRI EXAM: CT HEAD WITHOUT CONTRAST TECHNIQUE: Contiguous axial images were obtained from the base of the skull through the vertex without intravenous contrast. COMPARISON:  Correlation made with prior MR imaging FINDINGS: Brain: In the region of the right frontal lesion on MRI, there is some ill-defined hypoattenuation. No mass effect or acute hemorrhage. There is no extra-axial fluid collection. Ventricles and sulci are mildly prominent, reflecting parenchymal volume loss. Patchy and confluent areas of hypoattenuation in the supratentorial white matter are nonspecific but may reflect mild to moderate chronic microvascular ischemic changes. Vascular: There is minor intracranial atherosclerotic calcification at the skull base. Skull: Calvarium is unremarkable. Sinuses/Orbits: No acute finding. Other: None. IMPRESSION: Some ill-defined hypoattenuation in the region of right frontal lesion on recent prior MRI. There is no mass effect or hemorrhage. Followup MR imaging is recommended over a greater interval. Electronically Signed   By: Macy Mis M.D.   On: 06/03/2019 14:28   Ct Chest Wo Contrast  Result Date: 05/28/2019 CLINICAL DATA:  History of metastatic non-small cell lung cancer. EXAM: CT CHEST WITHOUT CONTRAST TECHNIQUE: Multidetector CT imaging of the chest was performed following the standard protocol without IV contrast. COMPARISON:  CT angio chest 04/29/2019 FINDINGS:  Cardiovascular: The heart size appears normal. There is a small pericardial effusion. Stable to slightly decreased in volume from previous exam. Aortic atherosclerosis. Left main, lad, left circumflex and RCA coronary artery calcifications Mediastinum/Nodes: Normal appearance of the thyroid gland. The trachea appears patent and is midline. Normal appearance of the esophagus. 1 cm right paratracheal lymph node is identified, 5/3. Unchanged. Enlarged subcarinal lymph node is difficult to measure due to lack of IV contrast material and progressive right pleural effusion extending into the subcarinal region. The best estimate is this measures approximate 2.7 cm, image 79/3. Previously 3 cm. The hilar structures are suboptimally evaluated due to lack of IV contrast material. Lungs/Pleura: Large right pleural effusion is increased volume when compared with previous exam. There is persistent and progressive atelectasis of the right middle lobe. Presumed central obstructing lesion is difficult to identify to lack of IV contrast material. Rounded density within the right upper lobe appears smaller but progressively more solid measuring 2.5 x 2.0 cm, image 38/4. Previously 2.5 x 3.1 cm. Upper Abdomen: No acute abnormality identified. Enlarged left adrenal gland is partially imaged. Musculoskeletal: No chest wall mass or  suspicious bone lesions identified. IMPRESSION: 1. Comparison to previous contrast enhanced CT of the chest from 05/08/2019 is significantly limited largely due to lack of IV contrast material. 2. Interval increase in volume of right pleural effusion. There is progressive atelectasis within the right middle lobe which may be postobstructive. Central right hilar lesion cannot be excluded. 3. Persistent enlarged subcarinal lymph node as noted on previous exam. Comparison with the prior study however is limited due to lack of IV contrast material. 4. Right upper lobe nodular density is smaller but more solid when  compared with previous exam. This is nonspecific but may represent an area progressive organizing pneumonia. Electronically Signed   By: Kerby Moors M.D.   On: 05/28/2019 07:22   Mr Brain Wo Contrast  Result Date: 05/29/2019 CLINICAL DATA:  Combined small and non smell also lung cancer. Staging. EXAM: MRI HEAD WITHOUT CONTRAST TECHNIQUE: Multiplanar, multiecho pulse sequences of the brain and surrounding structures were obtained without intravenous contrast. COMPARISON:  MRI head 05/09/2015 FINDINGS: Brain: New lesion in the right frontal lobe with surrounding edema and mild hemorrhage. This is highly likely to be metastatic disease. Postcontrast imaging recommended to confirm. Multiple small white matter hyperintensities bilaterally are indeterminate. Small lesions in the right parietal subcortical white matter are suspicious for metastatic disease but contrast enhanced imaging is needed. Generalized atrophy.  Negative for acute infarct. Vascular: Normal arterial flow voids Skull and upper cervical spine: No focal skeletal abnormality. Sinuses/Orbits: Negative Other: Image quality degraded by motion. Limited evaluation for metastatic disease without intravenous contrast IMPRESSION: Hemorrhagic lesion right frontal lobe less than 1 cm with surrounding edema, highly suspicious for metastatic disease. Recommend postcontrast imaging for confirmation and evaluate for other possible lesions. Generalized atrophy.  No acute infarct. Electronically Signed   By: Franchot Gallo M.D.   On: 05/29/2019 11:13   Mr Brain W Contrast  Result Date: 05/29/2019 CLINICAL DATA:  Lung cancer rule out metastatic disease. EXAM: MRI HEAD WITH CONTRAST TECHNIQUE: Multiplanar, multiecho pulse sequences of the brain and surrounding structures were obtained with intravenous contrast. CONTRAST:  70mL GADAVIST GADOBUTROL 1 MMOL/ML IV SOLN COMPARISON:  Unenhanced MRI head 05/29/2019 FINDINGS: Right frontal lobe abnormality shows  stellate enhancement. This area showed susceptibility suggestive of hemorrhage with surrounding edema. The stellate pattern of enhancement is unusual for metastatic disease. No other enhancing lesions in the brain identified. Ventricle size normal.  No midline shift. IMPRESSION: Right frontal lobe lesion shows stellate enhancement. This pattern is unusual for metastatic disease. Given the susceptibility and surrounding edema, continue to favor metastatic disease however enhancing subacute infarct is a consideration and short-term MRI follow-up without and with contrast is recommended in 2-4 weeks. Electronically Signed   By: Franchot Gallo M.D.   On: 05/29/2019 13:06   US Renal  Result Date: 05/31/2019 CLINICAL DATA:  Acute kidney injury. EXAM: RENAL / URINARY TRACT ULTRASOUND COMPLETE COMPARISON:  CT 05/08/2019 FINDINGS: Right Kidney: Renal measurements: 10.7 x 4.6 x 4.4 cm = volume: 112.4 mL. Thinning of renal parenchyma with increased renal echogenicity. No hydronephrosis. Multiple (less than 10) cysts within the kidney, all less than 1 cm. No obvious intra or perirenal fluid collection. Left Kidney: Renal measurements: 14.0 x 7.9 x 6.0 cm = volume: 349 mL. Slight increased renal echogenicity. No hydronephrosis. Multiple (less than 10) cysts within the left kidney. Largest cyst superior medially measures 4.9 cm greatest dimension. No obvious intrarenal or perirenal fluid collection. Bladder: Appears normal for degree of bladder distention. Both ureteral jets  are visualized. Other: None. IMPRESSION: 1. No obstructive uropathy. 2. Increased bilateral renal echogenicity with mild thinning of the right renal parenchyma suggesting chronic medical renal disease. 3. Bilateral renal cysts. Electronically Signed   By: Keith Rake M.D.   On: 05/31/2019 23:46   Dg Chest Port 1 View  Result Date: 06/01/2019 CLINICAL DATA:  Chest tube EXAM: PORTABLE CHEST 1 VIEW COMPARISON:  05/31/2019 FINDINGS: Interval  placement of PleurX on the right. No visible pneumothorax. Continued suspected layering right effusion. Right infrahilar atelectasis again noted. Left lung clear. Heart is upper limits normal in size. No acute bony abnormality lateral left 6th rib fracture, chronic. IMPRESSION: Interval placement of right PleurX catheter. No pneumothorax. Probable continued layering right effusion with right infrahilar atelectasis. Electronically Signed   By: Rolm Baptise M.D.   On: 06/01/2019 19:18   Dg Chest Port 1 View  Result Date: 05/31/2019 CLINICAL DATA:  Status post thoracentesis. EXAM: PORTABLE CHEST 1 VIEW COMPARISON:  May 30, 2019. FINDINGS: Stable cardiomediastinal silhouette. Right pleural effusion appears to be smaller. Right infrahilar atelectasis is noted. Left lung is clear. No pneumothorax is noted. Bony thorax is unremarkable. IMPRESSION: No pneumothorax status post right-sided thoracentesis. Right pleural effusion appears smaller. Electronically Signed   By: Marijo Conception M.D.   On: 05/31/2019 14:15   Dg Chest Port 1 View  Result Date: 05/30/2019 CLINICAL DATA:  Dyspnea, pleural effusion EXAM: PORTABLE CHEST 1 VIEW COMPARISON:  05/28/2019 FINDINGS: Slight interval increase in a moderate, layering right pleural effusion with associated atelectasis or consolidation. The left lung is normally aerated. Cardiomegaly. IMPRESSION: 1. Slight interval increase in a moderate, layering right pleural effusion with associated atelectasis or consolidation. 2.  The left lung is normally aerated. 3.  Cardiomegaly. Electronically Signed   By: Eddie Candle M.D.   On: 05/30/2019 14:58   Dg Chest Port 1 View  Result Date: 05/28/2019 CLINICAL DATA:  Shortness of breath. EXAM: PORTABLE CHEST 1 VIEW COMPARISON:  05/27/2019.  CT 05/28/2019.  04/29/2019. FINDINGS: Mediastinum unchanged. Stable cardiomegaly. Dense right middle lobe atelectatic changes again noted. Underlying mass lesion again cannot be excluded.  Right pleural effusion has increased in size. Reference is made to today's chest CT for further evaluation. No pneumothorax. IMPRESSION: 1. Persistent right middle lobe atelectasis. Underlying mass lesion cannot be excluded. 2. Progressive right pleural effusion. Reference is made to today's chest CT for further evaluation. Electronically Signed   By: Marcello Moores  Register   On: 05/28/2019 12:10   Dg Chest Port 1 View  Result Date: 05/27/2019 CLINICAL DATA:  Pleural effusion. EXAM: PORTABLE CHEST 1 VIEW COMPARISON:  Chest x-ray May 27, 2019 FINDINGS: The right-sided pleural effusion is smaller in the interval after thoracentesis. No pneumothorax. Centralized somewhat masslike opacities seen in the right perihilar region, better assessed on previous CT imaging. A left lower veins clear. The cardiomediastinal silhouette is stable. IMPRESSION: 1. Centralized somewhat masslike opacity in the right perihilar region was better assessed on previous CT imaging and is stable. 2. The right-sided pleural effusion is smaller in the interval after thoracentesis with no pneumothorax. Electronically Signed   By: Dorise Bullion III M.D   On: 05/27/2019 18:51   Dg Chest Portable 1 View  Result Date: 05/27/2019 CLINICAL DATA:  Patient brought in by wife stating patient c/o shortness of breath, decreased appetite, dizziness and pain all over. EXAM: PORTABLE CHEST 1 VIEW COMPARISON:  04/30/2019 and earlier exams. FINDINGS: There is significant increased opacity on the right when compared to  the most recent prior chest radiograph. There is at least a moderate pleural effusion. Additional opacity right perihilar and lower lung consistent with atelectasis, pneumonia or a combination. This has also increased from the prior exam. Left lung remains clear.  No left pleural effusion. Cardiac silhouette is normal in size. No mediastinal or left hilar mass. Right hilum partly obscured. Skeletal structures are grossly intact.  IMPRESSION: 1. Worsening aeration on the right compared to the most recent prior study. Previously noted right pleural effusion has enlarged. There is increased opacity in the right perihilar and right lung base is consistent with pneumonia, atelectasis or a combination, with atelectasis favored. Electronically Signed   By: Lajean Manes M.D.   On: 05/27/2019 12:07   Ct Angio Abd/pel W And/or Wo Contrast  Result Date: 05/08/2019 CLINICAL DATA:  Question ischemia or renal infarct, recent diagnosis of atrial fibrillation, recent abnormal CT with persistent right abdominal pain. EXAM: CTA ABDOMEN AND PELVIS WITHOUT AND WITH CONTRAST TECHNIQUE: Multidetector CT imaging of the abdomen and pelvis was performed using the standard protocol during bolus administration of intravenous contrast. Multiplanar reconstructed images and MIPs were obtained and reviewed to evaluate the vascular anatomy. CONTRAST:  31mL OMNIPAQUE IOHEXOL 350 MG/ML SOLN COMPARISON:  CT 04/29/2019 FINDINGS: VASCULAR Aorta: Mixture of calcified noncalcified atherosclerotic plaque throughout the abdominal aorta. There is fusiform abdominal aortic aneurysm measuring up to 3 cm in maximal diameter. No periaortic stranding. No occlusion or stenosis. Celiac: Ostial plaque results in mild narrowing. Minimal plaque within the proximal vessel without significant stenosis. No evidence of aneurysm, dissection or vasculitis. SMA: Ostial plaque results and mild narrowing. Minimal atheromatous plaque in the proximal vessel. No significant stenosis. No evidence of aneurysm, dissection or vasculitis. Renals: Single renal arteries bilaterally. Plaque results in at least moderate narrowing of the proximal right renal artery. Normal distal opacification. Ostial plaque results and mild narrowing of the left renal artery as well. IMA: IMA arises from the fusiform abdominal aortic aneurysm. No evidence of aneurysm, dissection or vasculitis. Inflow: No extension of the  aneurysm into the proximal inflow. Calcified noncalcified plaque seen throughout the inflow vessels. No aneurysm, dissection, vasculitis or significant stenosis. Proximal Outflow: Plaque noted in the common femoral arteries and at the level of the bifurcations without significant stenosis, aneurysm or dissection Veins: Venous phase imaging reveals no significant venous abnormality. Portal and hepatic veins are patent. Review of the MIP images confirms the above findings. NON-VASCULAR Lower chest: Redemonstration of a moderate right pleural effusion with adjacent areas of passive atelectasis in the right lung base. No abnormal pleural thickening or enhancement. Small volume pericardial effusion. Cardiac size is within normal limits. Hepatobiliary: No focal liver abnormality is seen. No gallstones, gallbladder wall thickening, or biliary dilatation. Pancreas: Fatty replacement of the pancreas. No pancreatic ductal dilatation or surrounding inflammatory changes. Spleen: Normal in size without focal abnormality. Adrenals/Urinary Tract: Stable appearance of the 1.6 cm left adrenal nodule. Right adrenal gland is unremarkable. Exophytic left renal cysts are similar to prior exam. There is moderate bilateral perinephric stranding and low-attenuation striations of the bilateral renal parenchyma. Ill-defined rounded hypoattenuating lesions involving both kidneys are suspicious for lobar nephronia or developing abscess, less likely infarct in the setting of additional inflammation and infectious features. Right extrarenal pelvis. Urothelial thickening of both collecting systems. Circumferential thickening of the urinary bladder with bladder wall trabeculations. Stomach/Bowel: Distal esophagus, stomach and duodenal sweep are unremarkable. No small bowel wall thickening or dilatation. No evidence of obstruction. A normal appendix is  visualized. No colonic dilatation or wall thickening. Lymphatic: No suspicious or enlarged lymph  nodes in the included lymphatic chains. Reproductive: Borderline prostatomegaly. Other: No abdominopelvic free fluid or free gas. No bowel containing hernias. Small fat containing inguinal hernias. Musculoskeletal: Multilevel degenerative changes are present in the imaged portions of the spine. Findings most pronounced at L3-4 with a calcified disc bulge resulting in moderate canal stenosis. Features likely exacerbated by what appear to be congenitally short pedicles. No acute osseous abnormality or suspicious osseous lesion. IMPRESSION: VASCULAR 1. Aortic Atherosclerosis (ICD10-I70.0). 2. Ostial plaque results in mild narrowing of the celiac, SMA, and left renal artery origins. 3. Moderate segmental narrowing of the proximal right renal artery secondary to atheromatous plaque. 4. Infrarenal abdominal aortic aneurysm measuring up 3 cm. Recommend followup by ultrasound in 3 years. This recommendation follows ACR consensus guidelines: White Paper of the ACR Incidental Findings Committee II on Vascular Findings. J Am Coll Radiol 2013; 10:789-794. Aortic aneurysm NOS (ICD10-I71.9) NON-VASCULAR 1. Bilateral perinephric and periureteral stranding with urothelial thickening, nephrographic striations and ill-defined rounded hypoattenuating lesions involving both kidneys, suspicious for lobar nephronia or developing abscess, less likely infarct given the additional features supporting an ascending urinary tract infection. 2. Circumferential thickening of the urinary bladder with bladder wall trabeculations, consistent with chronic outlet obstruction or neurogenic bladder. 3. Moderate right pleural effusion with adjacent areas of passive atelectasis in the right lung base. 4. Small volume pericardial effusion. These results were called by telephone at the time of interpretation on 05/08/2019 at 7:23 pm to provider [facility] , who verbally acknowledged these results. Electronically Signed   By: Lovena Le M.D.   On:  05/08/2019 19:24   Ir Thoracentesis Asp Pleural Space W/img Guide  Result Date: 05/31/2019 INDICATION: Patient with history of AFib on Eliquis, recently diagnosed malignant pleural effusion presented to the ED today with worsening dyspnea. Chest x-ray shows large right pleural effusion. Request IR for diagnostic and therapeutic thoracentesis. EXAM: ULTRASOUND GUIDED RIGHT THORACENTESIS MEDICATIONS: 8 mL 1% lidocaine COMPLICATIONS: None immediate. PROCEDURE: An ultrasound guided thoracentesis was thoroughly discussed with the patient and questions answered. The benefits, risks, alternatives and complications were also discussed. The patient understands and wishes to proceed with the procedure. Written consent was obtained. Ultrasound was performed to localize and mark an adequate pocket of fluid in the right chest. The area was then prepped and draped in the normal sterile fashion. 1% Lidocaine was used for local anesthesia. Under ultrasound guidance a 6 Fr Safe-T-Centesis catheter was introduced. Thoracentesis was performed. The catheter was removed and a dressing applied. FINDINGS: A total of approximately 1.3 L of bloody fluid was removed. Samples were sent to the laboratory as requested by the clinical team. IMPRESSION: Successful ultrasound guided right thoracentesis yielding 1.3 L of pleural fluid. Read by Candiss Norse, PA-C Electronically Signed   By: Lucrezia Europe M.D.   On: 05/27/2019 17:07     Subjective: Eager to go home  Discharge Exam: Vitals:   06/04/19 0905 06/04/19 1123  BP: 108/69 104/61  Pulse:  87  Resp:    Temp:  97.8 F (36.6 C)  SpO2:  96%   Vitals:   06/04/19 0324 06/04/19 0739 06/04/19 0905 06/04/19 1123  BP: 116/70 108/69 108/69 104/61  Pulse: (!) 102 83  87  Resp: 20     Temp: 97.7 F (36.5 C) 97.6 F (36.4 C)  97.8 F (36.6 C)  TempSrc: Oral Oral  Oral  SpO2: 98% 97%  96%  Weight:  Height:        General: Pt is alert, awake, not in acute  distress Cardiovascular: RRR, S1/S2 +, no rubs, no gallops Respiratory: CTA bilaterally, no wheezing, no rhonchi Abdominal: Soft, NT, ND, bowel sounds + Extremities: no edema, no cyanosis   The results of significant diagnostics from this hospitalization (including imaging, microbiology, ancillary and laboratory) are listed below for reference.     Microbiology: Recent Results (from the past 240 hour(s))  SARS CORONAVIRUS 2 (TAT 6-24 HRS) Nasopharyngeal Nasopharyngeal Swab     Status: None   Collection Time: 05/27/19 11:35 AM   Specimen: Nasopharyngeal Swab  Result Value Ref Range Status   SARS Coronavirus 2 NEGATIVE NEGATIVE Final    Comment: (NOTE) SARS-CoV-2 target nucleic acids are NOT DETECTED. The SARS-CoV-2 RNA is generally detectable in upper and lower respiratory specimens during the acute phase of infection. Negative results do not preclude SARS-CoV-2 infection, do not rule out co-infections with other pathogens, and should not be used as the sole basis for treatment or other patient management decisions. Negative results must be combined with clinical observations, patient history, and epidemiological information. The expected result is Negative. Fact Sheet for Patients: SugarRoll.be Fact Sheet for Healthcare Providers: https://www.woods-mathews.com/ This test is not yet approved or cleared by the [place] FDA and  has been authorized for detection and/or diagnosis of SARS-CoV-2 by FDA under an Emergency Use Authorization (EUA). This EUA will remain  in effect (meaning this test can be used) for the duration of the COVID-19 declaration under Section 56 4(b)(1) of the Act, 21 U.S.C. section 360bbb-3(b)(1), unless the authorization is terminated or revoked sooner. Performed at [facility], [place] [address] [place], [place] [address]   Urine culture     Status: Abnormal   Collection Time: 05/27/19 11:37 AM    Specimen: Urine, Random  Result Value Ref Range Status   Specimen Description URINE, RANDOM  Final   Special Requests   Final    NONE Performed at [facility], [place] [address], [place], [place] [address]    Culture >=100,000 COLONIES/mL YEAST (A)  Final   Report Status 05/28/2019 FINAL  Final  Blood culture (routine x 2)     Status: None   Collection Time: 05/27/19 12:00 PM   Specimen: BLOOD  Result Value Ref Range Status   Specimen Description BLOOD RIGHT ANTECUBITAL  Final   Special Requests   Final    BOTTLES DRAWN AEROBIC AND ANAEROBIC Blood Culture adequate volume   Culture   Final    NO GROWTH 5 DAYS Performed at [facility], [place] [address] [place], [place] [address]    Report Status 06/01/2019 FINAL  Final  Blood culture (routine x 2)     Status: None   Collection Time: 05/27/19  1:18 PM   Specimen: BLOOD RIGHT HAND  Result Value Ref Range Status   Specimen Description BLOOD RIGHT HAND  Final   Special Requests   Final    BOTTLES DRAWN AEROBIC AND ANAEROBIC Blood Culture results may not be optimal due to an inadequate volume of blood received in culture bottles   Culture   Final    NO GROWTH 5 DAYS Performed at [facility], [place] [address] [place], [place] [address]    Report Status 06/01/2019 FINAL  Final  Gram stain     Status: None   Collection Time: 05/27/19  5:10 PM   Specimen: PATH Cytology Pleural fluid  Result  Value Ref Range Status   Specimen Description FLUID PLEURAL  Final   Special Requests NONE  Final   Gram Stain   Final    RARE WBC PRESENT, PREDOMINANTLY PMN NO ORGANISMS SEEN Performed at [facility], [place] [address], [place], [place] [address]    Report Status 05/28/2019 FINAL  Final  Culture, body fluid-bottle     Status: None   Collection Time: 05/27/19  5:10 PM   Specimen: Fluid  Result Value Ref Range Status   Specimen Description FLUID PLEURAL  Final   Special Requests BOTTLES DRAWN AEROBIC AND ANAEROBIC  Final    Culture   Final    NO GROWTH 5 DAYS Performed at [facility], [place] [address] [place], [place] [address]    Report Status 06/01/2019 FINAL  Final  MRSA PCR Screening     Status: None   Collection Time: 06/03/19 11:38 PM   Specimen: Nasal Mucosa; Nasopharyngeal  Result Value Ref Range Status   MRSA by PCR NEGATIVE NEGATIVE Final    Comment:        The GeneXpert MRSA Assay (FDA approved for NASAL specimens only), is one component of a comprehensive MRSA colonization surveillance program. It is not intended to diagnose MRSA infection nor to guide or monitor treatment for MRSA infections. Performed at [facility], [place] [address], [place],  [address]      Labs: BNP (last 3 results) No results for input(s): BNP in the last 8760 hours. Basic Metabolic Panel: Recent Labs  Lab 05/31/19 0038  06/01/19 0214  06/01/19 2147 06/02/19 0043 06/02/19 0742 06/03/19 0651 06/04/19 0306  NA 130*  --  133*  --   --   --  136 137 136  K 5.6*   < > 5.5*   < > 5.1 5.3* 5.3* 4.5 4.3  CL 101  --  105  --   --   --  105 105 101  CO2 14*  --  17*  --   --   --  20* 21* 22  GLUCOSE 385*  --  150*  --   --   --  205* 137* 165*  BUN 49*  --  55*  --   --   --  47* 42* 40*  CREATININE 1.80*  --  1.69*  --   --   --  1.57* 1.34* 1.38*  CALCIUM 8.3*  --  8.7*  --   --   --  9.0 8.2* 8.2*  MG  --   --  2.5*  --   --   --   --   --   --   PHOS  --   --  2.8  --   --   --   --   --   --    < > = values in this interval not displayed.   Liver Function Tests: Recent Labs  Lab 06/01/19 0214  ALBUMIN 2.2*   No results for input(s): LIPASE, AMYLASE in the last 168 hours. No results for input(s): AMMONIA in the last 168 hours. CBC: Recent Labs  Lab 05/29/19 0428 05/30/19 0446 05/31/19 0038 06/01/19 0214 06/02/19 0742  WBC 29.6* 25.9* 34.8* 33.0* 43.6*  NEUTROABS  --   --   --  25.5*  --   HGB 11.4* 11.1* 11.3* 10.6* 11.5*  HCT 34.1* 33.8* 34.2* 31.7* 34.0*  MCV 87.7  89.2 89.8 88.3 87.4  PLT 208 250 321 276  279   Cardiac Enzymes: No results for input(s): CKTOTAL, CKMB, CKMBINDEX, TROPONINI in the last 168 hours. BNP: Invalid input(s): POCBNP CBG: Recent Labs  Lab 06/03/19 1633 06/03/19 2127 06/03/19 2323 06/04/19 0552 06/04/19 1125  GLUCAP 281* 291* 263* 149* 250*   D-Dimer No results for input(s): DDIMER in the last 72 hours. Hgb A1c No results for input(s): HGBA1C in the last 72 hours. Lipid Profile No results for input(s): CHOL, HDL, LDLCALC, TRIG, CHOLHDL, LDLDIRECT in the last 72 hours. Thyroid function studies No results for input(s): TSH, T4TOTAL, T3FREE, THYROIDAB in the last 72 hours.  Invalid input(s): FREET3 Anemia work up No results for input(s): VITAMINB12, FOLATE, FERRITIN, TIBC, IRON, RETICCTPCT in the last 72 hours. Urinalysis    Component Value Date/Time   COLORURINE YELLOW 05/27/2019 1223   APPEARANCEUR HAZY (A) 05/27/2019 1223   LABSPEC 1.016 05/27/2019 1223   PHURINE 5.0 05/27/2019 1223   GLUCOSEU 50 (A) 05/27/2019 1223   HGBUR NEGATIVE 05/27/2019 1223   BILIRUBINUR NEGATIVE 05/27/2019 1223   KETONESUR NEGATIVE 05/27/2019 1223   PROTEINUR 30 (A) 05/27/2019 1223   NITRITE NEGATIVE 05/27/2019 1223   LEUKOCYTESUR SMALL (A) 05/27/2019 1223   Sepsis Labs Invalid input(s): PROCALCITONIN,  WBC,  LACTICIDVEN Microbiology Recent Results (from the past 240 hour(s))  SARS CORONAVIRUS 2 (TAT 6-24 HRS) Nasopharyngeal Nasopharyngeal Swab     Status: None   Collection Time: 05/27/19 11:35 AM   Specimen: Nasopharyngeal Swab  Result Value Ref Range Status   SARS Coronavirus 2 NEGATIVE NEGATIVE Final    Comment: (NOTE) SARS-CoV-2 target nucleic acids are NOT DETECTED. The SARS-CoV-2 RNA is generally detectable in upper and lower respiratory specimens during the acute phase of infection. Negative results do not preclude SARS-CoV-2 infection, do not rule out co-infections with other pathogens, and should not be used as  the sole basis for treatment or other patient management decisions. Negative results must be combined with clinical observations, patient history, and epidemiological information. The expected result is Negative. Fact Sheet for Patients: SugarRoll.be Fact Sheet for Healthcare Providers: https://www.woods-mathews.com/ This test is not yet approved or cleared by the [place] FDA and  has been authorized for detection and/or diagnosis of SARS-CoV-2 by FDA under an Emergency Use Authorization (EUA). This EUA will remain  in effect (meaning this test can be used) for the duration of the COVID-19 declaration under Section 56 4(b)(1) of the Act, 21 U.S.C. section 360bbb-3(b)(1), unless the authorization is terminated or revoked sooner. Performed at [facility], [place] [address], [place], [place] [address]   Urine culture     Status: Abnormal   Collection Time: 05/27/19 11:37 AM   Specimen: Urine, Random  Result Value Ref Range Status   Specimen Description URINE, RANDOM  Final   Special Requests   Final    NONE Performed at [facility], [place] [address], [place], [place] [address]    Culture >=100,000 COLONIES/mL YEAST (A)  Final   Report Status 05/28/2019 FINAL  Final  Blood culture (routine x 2)     Status: None   Collection Time: 05/27/19 12:00 PM   Specimen: BLOOD  Result Value Ref Range Status   Specimen Description BLOOD RIGHT ANTECUBITAL  Final   Special Requests   Final    BOTTLES DRAWN AEROBIC AND ANAEROBIC Blood Culture adequate volume   Culture   Final    NO GROWTH 5 DAYS Performed at [facility], [place] [address] [place], [place] [address]  Report Status 06/01/2019 FINAL  Final  Blood culture (routine x 2)     Status: None   Collection Time: 05/27/19  1:18 PM   Specimen: BLOOD RIGHT HAND  Result Value Ref Range Status   Specimen Description BLOOD RIGHT HAND  Final   Special Requests   Final    BOTTLES  DRAWN AEROBIC AND ANAEROBIC Blood Culture results may not be optimal due to an inadequate volume of blood received in culture bottles   Culture   Final    NO GROWTH 5 DAYS Performed at [facility], [place] [address], [place], [place] [address]    Report Status 06/01/2019 FINAL  Final  Gram stain     Status: None   Collection Time: 05/27/19  5:10 PM   Specimen: PATH Cytology Pleural fluid  Result Value Ref Range Status   Specimen Description FLUID PLEURAL  Final   Special Requests NONE  Final   Gram Stain   Final    RARE WBC PRESENT, PREDOMINANTLY PMN NO ORGANISMS SEEN Performed at [facility], [place] [address] [place], [place] [address]    Report Status 05/28/2019 FINAL  Final  Culture, body fluid-bottle     Status: None   Collection Time: 05/27/19  5:10 PM   Specimen: Fluid  Result Value Ref Range Status   Specimen Description FLUID PLEURAL  Final   Special Requests BOTTLES DRAWN AEROBIC AND ANAEROBIC  Final   Culture   Final    NO GROWTH 5 DAYS Performed at [facility], [place] [address], [place], [place] [address]    Report Status 06/01/2019 FINAL  Final  MRSA PCR Screening     Status: None   Collection Time: 06/03/19 11:38 PM   Specimen: Nasal Mucosa; Nasopharyngeal  Result Value Ref Range Status   MRSA by PCR NEGATIVE NEGATIVE Final    Comment:        The GeneXpert MRSA Assay (FDA approved for NASAL specimens only), is one component of a comprehensive MRSA colonization surveillance program. It is not intended to diagnose MRSA infection nor to guide or monitor treatment for MRSA infections. Performed at [facility], [place] [address] [place],  [address]    Time spent: 30 min  SIGNED:   Marylu Lund, MD  [facility] 06/04/2019, 12:08 PM  If 7PM-7AM, please contact night-coverage

## 2019-06-04 NOTE — Progress Notes (Signed)
Drained 600cc from pleurex. Pink in color. Pt tolerated well. No complains at this time.

## 2019-06-05 NOTE — TOC Transition Note (Addendum)
Transition of Care [facility]) - CM/SW Discharge Note   Patient Details  Name: Brian French. MRN: [id number] Date of Birth: 10-06-1940  Transition of Care [facility]) CM/SW Contact:  Zenon Mayo, RN Phone Number: 06/05/2019, 9:55 AM   Clinical Narrative:    Per Nira Conn at [facility] authorization ia [id number], drains are being mailed to patient, he also has 8 drains with him per Staff RN.  Butch Penny with [facility] notified that patient will be discharged, HH was set up by previous NCM  Sam.  Also [facility]   Final next level of care: Home w Home Health Services Barriers to Discharge: No Barriers Identified   Patient Goals and CMS Choice   CMS Medicare.gov Compare Post Acute Care list provided to:: Patient Choice offered to / list presented to : Patient  Discharge Placement                       Discharge Plan and Services                DME Arranged: Tub bench, Walker rolling DME Agency: [facility]) Date DME Agency Contacted: 06/03/19 Time DME Agency Contacted: 1000   HH Arranged: RN, PT HH Agency: ([place]) Date [place]: 06/03/19 Time [place]: 1000 Representative spoke with at [place]: [place] ([place]) Interventions     Readmission Risk Interventions Readmission Risk Prevention Plan 06/01/2019  Transportation Screening Complete  Medication Review Press photographer) Complete  PCP or Specialist appointment within 3-5 days of discharge (No Data)  Some recent data might be hidden

## 2019-06-06 ENCOUNTER — Telehealth: Payer: Self-pay | Admitting: Radiation Oncology

## 2019-06-06 DIAGNOSIS — J910 Malignant pleural effusion: Secondary | ICD-10-CM | POA: Diagnosis not present

## 2019-06-06 DIAGNOSIS — Z510 Encounter for antineoplastic radiation therapy: Secondary | ICD-10-CM | POA: Diagnosis present

## 2019-06-06 DIAGNOSIS — Z87891 Personal history of nicotine dependence: Secondary | ICD-10-CM | POA: Diagnosis not present

## 2019-06-06 DIAGNOSIS — C3491 Malignant neoplasm of unspecified part of right bronchus or lung: Secondary | ICD-10-CM | POA: Diagnosis not present

## 2019-06-06 NOTE — Telephone Encounter (Signed)
I spoke with Ms. Brian French, Mr. Sandiford' daughter to answer questions that she and her mother had about his case. He is getting set up for outpatient PET, repeat MRI in about 2 weeks, and to see Dr. Lorenso Courier in medical oncology.

## 2019-06-07 ENCOUNTER — Encounter: Payer: Self-pay | Admitting: Radiation Oncology

## 2019-06-07 ENCOUNTER — Encounter (HOSPITAL_COMMUNITY): Payer: Self-pay | Admitting: Hematology and Oncology

## 2019-06-07 ENCOUNTER — Other Ambulatory Visit: Payer: Self-pay | Admitting: *Deleted

## 2019-06-07 ENCOUNTER — Telehealth: Payer: Self-pay | Admitting: Radiation Therapy

## 2019-06-07 ENCOUNTER — Telehealth: Payer: Self-pay | Admitting: Radiation Oncology

## 2019-06-07 ENCOUNTER — Ambulatory Visit
Admission: RE | Admit: 2019-06-07 | Discharge: 2019-06-07 | Disposition: A | Discharge status: Home / Self Care | Payer: Medicare Other | Source: Ambulatory Visit | Attending: Radiation Oncology | Admitting: Radiation Oncology

## 2019-06-07 ENCOUNTER — Ambulatory Visit (INDEPENDENT_AMBULATORY_CARE_PROVIDER_SITE_OTHER): Payer: Medicare Other | Admitting: Critical Care Medicine

## 2019-06-07 ENCOUNTER — Encounter: Payer: Self-pay | Admitting: Critical Care Medicine

## 2019-06-07 ENCOUNTER — Other Ambulatory Visit: Payer: Self-pay

## 2019-06-07 VITALS — BP 90/60 | HR 115 | Temp 97.5°F | Ht 71.0 in | Wt 207.6 lb

## 2019-06-07 DIAGNOSIS — R918 Other nonspecific abnormal finding of lung field: Secondary | ICD-10-CM | POA: Diagnosis not present

## 2019-06-07 DIAGNOSIS — R110 Nausea: Secondary | ICD-10-CM | POA: Diagnosis not present

## 2019-06-07 DIAGNOSIS — C3491 Malignant neoplasm of unspecified part of right bronchus or lung: Secondary | ICD-10-CM

## 2019-06-07 DIAGNOSIS — J910 Malignant pleural effusion: Secondary | ICD-10-CM

## 2019-06-07 LAB — CYTOLOGY - NON PAP

## 2019-06-07 LAB — SURGICAL PATHOLOGY

## 2019-06-07 MED ORDER — PROMETHAZINE HCL 12.5 MG PO TABS: 12.5000 mg | ORAL_TABLET | Freq: Three times a day (TID) | ORAL | 1 refills | Status: AC

## 2019-06-07 NOTE — Progress Notes (Signed)
I spoke with London Pepper, RN, as the patient has back pain that keeps him from lying down flat on the table for his radiotherapy to his lung.  PET scan is pending.    I have reviewed the patient's chart.  The patient does not have any more prescription strength medication at home for his pain.  He has not yet picked up his dexamethasone.  We will give him to Percocet tabs(5/325mg , each) to help him through treatment today.  The patient will see his provider, either Dr. Lisbeth Renshaw or Shona Simpson, tomorrow before treatment, to determine a long-term plan for his pain management.  -----------------------------------  Eppie Gibson, MD

## 2019-06-07 NOTE — Telephone Encounter (Signed)
Spoke with pt's wife about his upcoming brain MRI on 12/11. She has written this on their appointment calendar and confirmed that he has been discharged and that they will be here for his chest treatment later today.   Mont Dutton R.T.(R)(T) Radiation Special Procedures Navigator

## 2019-06-07 NOTE — Patient Outreach (Signed)
[facility]) Care Management  06/07/2019  Brian French. April 06, 1941 [id number]     EMMI-GENERAL DISCHARGE RED ON EMMI ALERT Day #1 Date: 06/06/2019 Red Alert Reason: Unfilled Prescriptions  Outreach#1 RN spoke with pt and introduced [facility] services and the purpose for today's call. Pt verified no additional issues with medications and pt has followed up with his provider.  PLAN: Will closed this case with no additional issues or needs at this time.  Raina Mina, RN Care Management Coordinator [place] [facility] [phone]

## 2019-06-07 NOTE — Progress Notes (Signed)
Therapist brought patient around to clinic for evaluation. Brian French, RT reported patient was unable to lay flat on treatment table due to thoracic spine pain. Patient reports stabbing thoracic spine pain 6 while sitting in wheelchair that escalates to a 10 when laying on treatment table. Patient endorses taking Tylenol 650 one hour ago. Patient denies numbness or tingling of extremities. Patient denies incontinence of bowel or bladder. Pleurex catheter draining without difficulty or irritation. Noted an order was placed by Shona Simpson, PA-C for the patient to have PET scan. Scan not scheduled yet due to need for pre cert. Informed Dr. Isidore Moos of these findings. Orders given to medicate and attempt treatment again. Patient refused. Patient ultimately agreed to return tomorrow thirty minutes prior to radiation treatment to be evaluated by Dr. Lisbeth Renshaw and a pain management plan created. Wife verbalized intent to pick up decadron 4 mg prescribed by Marylu Lund, MD today.

## 2019-06-07 NOTE — Telephone Encounter (Signed)
I called the patient's daughter and let her know the pathology results for NSCLC, Squamous Cell Carcinoma. We will proceed with XRT. They should be hearing from our office soon about PET scheduling and to see Dr. Lorenso Courier. We will manage his Dexamethasone but he should stay on the current dose until we have further discussion about his MRI.

## 2019-06-07 NOTE — Patient Instructions (Addendum)
Thank you for visiting Dr. Carlis Abbott at [facility]. We recommend the following:   Meds ordered this encounter  Medications  . promethazine (PHENERGAN) 12.5 MG tablet    Sig: Take 1 tablet (12.5 mg total) by mouth 3 (three) times daily.    Dispense:  90 tablet    Refill:  1     PleurX catheter- Drain your pleurX catheter daily. You should stop if you develop chest or shoulder pain. Sometimes draining slower (using the roller clamp) can help prevent pain. You should call us if you have new redness around your catheter site, especially if it is bleeding or draining pus.  Please call the home health company or the office with any questions.   Hold all diabetes medications until follow up with PCP- hopefully this week. Check BG at home two times daily and record for PCP.  Return in about 4 weeks (around 07/05/2019).       Please do your part to reduce the spread of COVID-19.

## 2019-06-07 NOTE — Progress Notes (Signed)
Synopsis: Referred in November 2020 for lung mass and pleural effusion by Dr. Earlie Counts.  Subjective:   PATIENT ID: Brian French. GENDER: male DOB: Jun 12, 1941, MRN: [id number]  Chief Complaint  Patient presents with  . Consult    Patient is here for follow up from procedure for cancer. Pleurx consult    Mr. Brian French is a 78 year old gentleman with a history of non-small cell lung cancer who presents for hospital follow-up.  During his hospitalization he underwent bronchoscopy with EBUS and right Pleurx catheter placement.  Since discharge she has been seen by home health twice with drainage of pleural fluid.  They have an appointment again this afternoon.  He is establishing care with radiation oncology this afternoon.  He has a history of previous tobacco abuse, but quit about 22 years ago.  Over the past few months he has been more forgetful, losing weight, and having reduced appetite.  He has had nausea that has been worsening over the last few weeks despite Zofran which was prescribed and he was in the hospital.  He was more confused last night, and EMS noted him to have a blood sugar less than 40.  This improved with the infusion of dextrose.  His wife has held his diabetes medications this morning, but did not check his blood sugar.  She is worried that he has not been eating and drinking regularly.  He has had some vomiting.  He is still on steroids for his brain mass.  Today he endorses some dizziness and significant nausea.  He and his wife are reluctant to go to the ER.  [place]  He was previously evaluated and cared for at the [facility] hospital where he had undergone thoracentesis.  At that time he had poorly differentiated malignant cells in the fluid.  He is up-to-date on his seasonal flu shot.  All of his subsequent care has been at [facility].     Past Medical History:  Diagnosis Date  . Atrial fibrillation ([place])   . Dementia ([place])   . Diabetes mellitus  without complication ([place])   . Headache disorder 05/22/2015  . Hearing loss    bilateral - hearing aids  . Hyperlipidemia   . Hypertension      Family History  Problem Relation Age of Onset  . Cancer Mother   . Heart disease Father      Past Surgical History:  Procedure Laterality Date  . CHEST TUBE INSERTION N/A 06/01/2019   Procedure: INSERTION PLEURAL DRAINAGE CATHETER;  Surgeon: Julian Hy, DO;  Location: [facility];  Service: Thoracic;  Laterality: N/A;  . IR THORACENTESIS ASP PLEURAL SPACE W/IMG GUIDE  04/30/2019  . IR THORACENTESIS ASP PLEURAL SPACE W/IMG GUIDE  05/27/2019  . right testicular swelling     had I and D after swelling s/p trauma from scrotal  swelling, got hit in the testes during judo 1962  . VIDEO BRONCHOSCOPY WITH ENDOBRONCHIAL ULTRASOUND N/A 06/01/2019   Procedure: VIDEO BRONCHOSCOPY WITH ENDOBRONCHIAL ULTRASOUND;  Surgeon: Julian Hy, DO;  Location: [facility] OR;  Service: Thoracic;  Laterality: N/A;    Social History   Socioeconomic History  . Marital status: Married    Spouse name: Gerald Stabs  . Number of children: 0  . Years of education: Grad  . Highest education level: Not on file  Occupational History  . Not on file  Social Needs  . Financial resource strain: Not on file  . Food insecurity    Worry:  Not on file    Inability: Not on file  . Transportation needs    Medical: Not on file    Non-medical: Not on file  Tobacco Use  . Smoking status: Former Smoker    Packs/day: 2.00    Types: Cigarettes    Quit date: 07/15/1997    Years since quitting: 21.9  . Smokeless tobacco: Never Used  Substance and Sexual Activity  . Alcohol use: Yes    Alcohol/week: 0.0 standard drinks    Comment: occasionally  . Drug use: Never  . Sexual activity: Not on file  Lifestyle  . Physical activity    Days per week: Not on file    Minutes per session: Not on file  . Stress: Not on file  Relationships  . Social Herbalist on phone: Not on file     Gets together: Not on file    Attends religious service: Not on file    Active member of club or organization: Not on file    Attends meetings of clubs or organizations: Not on file    Relationship status: Not on file  . Intimate partner violence    Fear of current or ex partner: Not on file    Emotionally abused: Not on file    Physically abused: Not on file    Forced sexual activity: Not on file  Other Topics Concern  . Not on file  Social History Narrative   Patient lives at home with his spouse.   Caffeine Use:      No Known Allergies   Immunization History  Administered Date(s) Administered  . Fluad Quad(high Dose 65+) 05/07/2019    Outpatient Medications Prior to Visit  Medication Sig Dispense Refill  . acetaminophen (TYLENOL) 500 MG tablet Take 1,000 mg by mouth every 6 (six) hours as needed for mild pain or headache.    Marland Kitchen aspirin EC 81 MG tablet Take 81 mg by mouth daily.    Marland Kitchen dexamethasone (DECADRON) 4 MG tablet Take 1 tablet (4 mg total) by mouth daily. 30 tablet 0  . diltiazem (CARDIZEM CD) 120 MG 24 hr capsule Take 1 capsule (120 mg total) by mouth daily. 30 capsule 11  . glipiZIDE (GLUCOTROL) 10 MG tablet Take 10 mg by mouth 2 (two) times daily before a meal.    . HYDROcodone-acetaminophen (NORCO/VICODIN) 5-325 MG tablet Take 1-2 tablets every 6 hours as needed for severe pain 8 tablet 0  . lidocaine (LIDODERM) 5 % Place 1 patch onto the skin daily. Remove & Discard patch within 12 hours or as directed by MD 30 patch 0  . memantine (NAMENDA) 10 MG tablet Take 10 mg by mouth at bedtime.    . metFORMIN (GLUCOPHAGE) 500 MG tablet Take 500 mg by mouth 2 (two) times daily with a meal.     . Multiple Vitamin (MULTIVITAMIN WITH MINERALS) TABS tablet Take 1 tablet by mouth daily.    . Omega-3 Fatty Acids (FISH OIL) 1000 MG CAPS Take 2,000 mg by mouth 2 (two) times daily.    Marland Kitchen omeprazole (PRILOSEC) 20 MG capsule Take 20 mg by mouth daily.    . ondansetron (ZOFRAN) 8 MG  tablet Take 4 mg by mouth 3 (three) times daily as needed for nausea or vomiting (30 minutes before meals).    . pioglitazone (ACTOS) 45 MG tablet Take 45 mg by mouth daily.    Marland Kitchen senna (SENOKOT) 8.6 MG tablet Take 1 tablet by mouth daily.    Marland Kitchen  tamsulosin (FLOMAX) 0.4 MG CAPS capsule Take 0.4 mg by mouth daily.    Marland Kitchen atorvastatin (LIPITOR) 80 MG tablet Take 80 mg by mouth daily at 6 PM.    . insulin aspart protamine- aspart (NOVOLOG MIX 70/30) (70-30) 100 UNIT/ML injection Inject 20 Units into the skin daily.     No facility-administered medications prior to visit.     Review of Systems  Constitutional: Positive for weight loss. Negative for chills and fever.       Poor appetite and poor intake  HENT: Negative.   Respiratory: Negative.   Cardiovascular: Negative for chest pain and leg swelling.  Gastrointestinal: Positive for nausea. Negative for diarrhea and vomiting.  Musculoskeletal: Negative.   Neurological: Positive for dizziness. Negative for weakness.     Objective:   Vitals:   06/07/19 1112  BP: 90/60  Pulse: (!) 115  Temp: (!) 97.5 F (36.4 C)  TempSrc: Temporal  SpO2: 95%  Weight: 207 lb 9.6 oz (94.2 kg)  Height: 5' 11"  (1.803 m)   95% on   RA BMI Readings from Last 3 Encounters:  06/07/19 28.95 kg/m  06/04/19 28.24 kg/m  05/13/19 32.08 kg/m   Wt Readings from Last 3 Encounters:  06/07/19 207 lb 9.6 oz (94.2 kg)  06/04/19 208 lb 3.2 oz (94.4 kg)  05/08/19 230 lb (104.3 kg)    Physical Exam Vitals signs reviewed.  Constitutional:      General: He is not in acute distress.    Appearance: Normal appearance. He is obese. He is ill-appearing.  HENT:     Head: Normocephalic and atraumatic.     Nose:     Comments: Deferred due to masking requirement.    Mouth/Throat:     Comments: Deferred due to masking requirement. Eyes:     General: No scleral icterus. Neck:     Musculoskeletal: Neck supple.  Cardiovascular:     Rate and Rhythm: Regular rhythm.      Heart sounds: No murmur.  Pulmonary:     Comments: Breathing comfortably on room air, clear to auscultation bilaterally other than mildly reduced breath sounds in the right base. Abdominal:     General: There is no distension.     Palpations: Abdomen is soft.     Comments: Visibly nauseous but no vomiting during encounter.  Musculoskeletal:        General: No swelling or deformity.  Lymphadenopathy:     Cervical: No cervical adenopathy.  Skin:    General: Skin is dry.     Findings: No rash.     Comments: No erythema surrounding Pleurx catheter.  Sutures in place.  Neurological:     Mental Status: He is alert.     Coordination: Coordination normal.     Comments: Forgetful, relying on his wife for history  Psychiatric:        Mood and Affect: Mood normal.        Behavior: Behavior normal.      CBC    Component Value Date/Time   WBC 43.6 (H) 06/02/2019 0742   RBC 3.89 (L) 06/02/2019 0742   HGB 11.5 (L) 06/02/2019 0742   HCT 34.0 (L) 06/02/2019 0742   PLT 279 06/02/2019 0742   MCV 87.4 06/02/2019 0742   MCH 29.6 06/02/2019 0742   MCHC 33.8 06/02/2019 0742   RDW 15.1 06/02/2019 0742   LYMPHSABS 1.3 06/01/2019 0214   MONOABS 3.3 (H) 06/01/2019 0214   EOSABS 0.9 (H) 06/01/2019 0214   BASOSABS 0.1 06/01/2019  [facility]  Lab 06/01/19 0214  06/02/19 0742 06/03/19 0651 06/04/19 0306  NA 133*  --  136 137 136  K 5.5*   < > 5.3* 4.5 4.3  CL 105  --  105 105 101  CO2 17*  --  20* 21* 22  GLUCOSE 150*  --  205* 137* 165*  BUN 55*  --  47* 42* 40*  CREATININE 1.69*  --  1.57* 1.34* 1.38*  CALCIUM 8.7*  --  9.0 8.2* 8.2*  MG 2.5*  --   --   --   --   PHOS 2.8  --   --   --   --    < > = values in this interval not displayed.   Estimated Creatinine Clearance: 52.6 mL/min (A) (by C-G formula based on SCr of 1.38 mg/dL (H)).   Chest Imaging- films reviewed: CT chest 05/28/2019- apical RUL mass, extrinsic narrowing of RML with distal atelectasis.   Multiple enlarged mediastinal nodes.  Dependent right pleural effusion.  MRI brain 05/29/2019- right frontal lobe lesion, mass versus evolving infarct  CXR, 1-view 06/01/2019-right-sided pleurex drain with minimal residual effusion, right middle lobe mass  Pulmonary Functions Testing Results: No flowsheet data found.  Pathology:  Ascites 04/30/2019 - the malignant cells are positive for cytokeratin 7. They are negative for cytokeratin 20, TTF-1. WT-1, and calretinin. This profile is non-specific.  Pathology from EBUS, endobronchial biopsies, and pleural fluid 06/01/19 - NSCLC, probably SCC EGFR mutation negative  Echocardiogram 04/30/2019: LVEF 50 to 55%, mild LVH, diastolic dysfunction.  Normal LA, RV, RA.  Trace MR, otherwise normal valves.     Assessment & Plan:     ICD-10-CM   1. Hilar mass  R91.8   2. Malignant pleural effusion  J91.0   3. Nausea  R11.0   4. Squamous cell lung cancer, right (HCC)  C34.91      Right NSCLC- SCC, stage IV with MPE and likely brain mets. -Continue follow-up with oncology and radiation oncology -Daily drainage of PE with Pleurx bottles.  Discussed risk for infection associated with catheter-signs would include erythema or drainage around the catheter.  If this occurs he should let us know immediately. -Brain MRI later this week -PET scan pending  Nausea -Phenergan 12.5 mg 3 times daily; QTC within normal limits during sent hospitalization -Recommended referral to the ER, but the patient and his wife are reluctant to go.  I cautioned him that he likely needs to really push his p.o. intake, and he may need IV fluids.  I am concerned that he is very high risk for hospital readmission due to this.  Hypoglycemia last night -Recommended checking Accu-Cheks twice daily -Hold oral diabetes medications for now and insulin; needs PCP follow-up very quickly-sometime this week to adjust his regimen given his recent weight loss and poor p.o. intake   RTC in 4 weeks. Will remove sutures at that time.   Current Outpatient Medications:  .  acetaminophen (TYLENOL) 500 MG tablet, Take 1,000 mg by mouth every 6 (six) hours as needed for mild pain or headache., Disp: , Rfl:  .  aspirin EC 81 MG tablet, Take 81 mg by mouth daily., Disp: , Rfl:  .  dexamethasone (DECADRON) 4 MG tablet, Take 1 tablet (4 mg total) by mouth daily., Disp: 30 tablet, Rfl: 0 .  diltiazem (CARDIZEM CD) 120 MG 24 hr capsule, Take 1 capsule (120 mg total) by mouth daily., Disp: 30 capsule, Rfl: 11 .  glipiZIDE (GLUCOTROL) 10 MG tablet, Take 10 mg by mouth 2 (two) times daily before a meal., Disp: , Rfl:  .  HYDROcodone-acetaminophen (NORCO/VICODIN) 5-325 MG tablet, Take 1-2 tablets every 6 hours as needed for severe pain, Disp: 8 tablet, Rfl: 0 .  lidocaine (LIDODERM) 5 %, Place 1 patch onto the skin daily. Remove & Discard patch within 12 hours or as directed by MD, Disp: 30 patch, Rfl: 0 .  memantine (NAMENDA) 10 MG tablet, Take 10 mg by mouth at bedtime., Disp: , Rfl:  .  metFORMIN (GLUCOPHAGE) 500 MG tablet, Take 500 mg by mouth 2 (two) times daily with a meal. , Disp: , Rfl:  .  Multiple Vitamin (MULTIVITAMIN WITH MINERALS) TABS tablet, Take 1 tablet by mouth daily., Disp: , Rfl:  .  Omega-3 Fatty Acids (FISH OIL) 1000 MG CAPS, Take 2,000 mg by mouth 2 (two) times daily., Disp: , Rfl:  .  omeprazole (PRILOSEC) 20 MG capsule, Take 20 mg by mouth daily., Disp: , Rfl:  .  ondansetron (ZOFRAN) 8 MG tablet, Take 4 mg by mouth 3 (three) times daily as needed for nausea or vomiting (30 minutes before meals)., Disp: , Rfl:  .  pioglitazone (ACTOS) 45 MG tablet, Take 45 mg by mouth daily., Disp: , Rfl:  .  senna (SENOKOT) 8.6 MG tablet, Take 1 tablet by mouth daily., Disp: , Rfl:  .  tamsulosin (FLOMAX) 0.4 MG CAPS capsule, Take 0.4 mg by mouth daily., Disp: , Rfl:  .  atorvastatin (LIPITOR) 80 MG tablet, Take 80 mg by mouth daily at 6 PM., Disp: , Rfl:  .  insulin aspart  protamine- aspart (NOVOLOG MIX 70/30) (70-30) 100 UNIT/ML injection, Inject 20 Units into the skin daily., Disp: , Rfl:  .  promethazine (PHENERGAN) 12.5 MG tablet, Take 1 tablet (12.5 mg total) by mouth 3 (three) times daily., Disp: 90 tablet, Rfl: 1   Julian Hy, DO [facility] 06/07/2019 5:37 PM

## 2019-06-08 ENCOUNTER — Ambulatory Visit
Admission: RE | Admit: 2019-06-08 | Discharge: 2019-06-08 | Disposition: A | Discharge status: Home / Self Care | Payer: Medicare Other | Source: Ambulatory Visit | Attending: Radiation Oncology | Admitting: Radiation Oncology

## 2019-06-08 ENCOUNTER — Other Ambulatory Visit: Payer: Self-pay

## 2019-06-08 ENCOUNTER — Encounter: Payer: Self-pay | Admitting: *Deleted

## 2019-06-08 DIAGNOSIS — Z510 Encounter for antineoplastic radiation therapy: Secondary | ICD-10-CM | POA: Diagnosis not present

## 2019-06-08 LAB — CYTOLOGY - NON PAP

## 2019-06-08 NOTE — Progress Notes (Signed)
Called patient to schedule his new patient appointment. Spoke to wife who states that everything is too overwhelming right now. She is "spread too thin" and can't make another MD appointment. She would like me to call back to schedule after he is done with radiation, currently scheduled for 06/23/2019.  Spoke with Dr Maylon Peppers. He is okay with waiting. Will follow up with patient after he complete radiation.   Informed the wife I would call back mid December to schedule a follow up. She was happy with this plan.

## 2019-06-09 ENCOUNTER — Other Ambulatory Visit: Payer: Self-pay

## 2019-06-09 ENCOUNTER — Ambulatory Visit
Admission: RE | Admit: 2019-06-09 | Discharge: 2019-06-09 | Disposition: A | Discharge status: Home / Self Care | Payer: Medicare Other | Source: Ambulatory Visit | Attending: Radiation Oncology | Admitting: Radiation Oncology

## 2019-06-09 DIAGNOSIS — Z510 Encounter for antineoplastic radiation therapy: Secondary | ICD-10-CM | POA: Diagnosis not present

## 2019-06-10 ENCOUNTER — Other Ambulatory Visit: Payer: Self-pay

## 2019-06-10 ENCOUNTER — Emergency Department (HOSPITAL_COMMUNITY)
Admission: EM | Admit: 2019-06-10 | Discharge: 2019-06-10 | Disposition: A | Discharge status: Home / Self Care | Payer: No Typology Code available for payment source | Attending: Emergency Medicine | Admitting: Emergency Medicine

## 2019-06-10 DIAGNOSIS — Z85118 Personal history of other malignant neoplasm of bronchus and lung: Secondary | ICD-10-CM | POA: Insufficient documentation

## 2019-06-10 DIAGNOSIS — F0390 Unspecified dementia without behavioral disturbance: Secondary | ICD-10-CM | POA: Insufficient documentation

## 2019-06-10 DIAGNOSIS — R110 Nausea: Secondary | ICD-10-CM | POA: Diagnosis not present

## 2019-06-10 DIAGNOSIS — R1013 Epigastric pain: Secondary | ICD-10-CM

## 2019-06-10 DIAGNOSIS — Z87891 Personal history of nicotine dependence: Secondary | ICD-10-CM | POA: Insufficient documentation

## 2019-06-10 DIAGNOSIS — E119 Type 2 diabetes mellitus without complications: Secondary | ICD-10-CM | POA: Insufficient documentation

## 2019-06-10 DIAGNOSIS — I10 Essential (primary) hypertension: Secondary | ICD-10-CM | POA: Insufficient documentation

## 2019-06-10 DIAGNOSIS — I4891 Unspecified atrial fibrillation: Secondary | ICD-10-CM | POA: Diagnosis not present

## 2019-06-10 DIAGNOSIS — Z79899 Other long term (current) drug therapy: Secondary | ICD-10-CM | POA: Insufficient documentation

## 2019-06-10 DIAGNOSIS — C3490 Malignant neoplasm of unspecified part of unspecified bronchus or lung: Secondary | ICD-10-CM

## 2019-06-10 DIAGNOSIS — I129 Hypertensive chronic kidney disease with stage 1 through stage 4 chronic kidney disease, or unspecified chronic kidney disease: Secondary | ICD-10-CM | POA: Diagnosis not present

## 2019-06-10 DIAGNOSIS — R739 Hyperglycemia, unspecified: Secondary | ICD-10-CM

## 2019-06-10 DIAGNOSIS — N183 Chronic kidney disease, stage 3 unspecified: Secondary | ICD-10-CM

## 2019-06-10 DIAGNOSIS — Z7984 Long term (current) use of oral hypoglycemic drugs: Secondary | ICD-10-CM | POA: Insufficient documentation

## 2019-06-10 DIAGNOSIS — E440 Moderate protein-calorie malnutrition: Secondary | ICD-10-CM

## 2019-06-10 DIAGNOSIS — E8809 Other disorders of plasma-protein metabolism, not elsewhere classified: Secondary | ICD-10-CM

## 2019-06-10 LAB — COMPREHENSIVE METABOLIC PANEL
ALT: 18 U/L (ref 0–44)
AST: 11 U/L — ABNORMAL LOW (ref 15–41)
Albumin: 2.4 g/dL — ABNORMAL LOW (ref 3.5–5.0)
Alkaline Phosphatase: 89 U/L (ref 38–126)
Anion gap: 10 (ref 5–15)
BUN: 48 mg/dL — ABNORMAL HIGH (ref 8–23)
CO2: 23 mmol/L (ref 22–32)
Calcium: 8.6 mg/dL — ABNORMAL LOW (ref 8.9–10.3)
Chloride: 98 mmol/L (ref 98–111)
Creatinine, Ser: 1.76 mg/dL — ABNORMAL HIGH (ref 0.61–1.24)
GFR calc Af Amer: 42 mL/min — ABNORMAL LOW (ref >60)
GFR calc non Af Amer: 36 mL/min — ABNORMAL LOW (ref >60)
Glucose, Bld: 421 mg/dL — ABNORMAL HIGH (ref 70–99)
Potassium: 5.4 mmol/L — ABNORMAL HIGH (ref 3.5–5.1)
Sodium: 131 mmol/L — ABNORMAL LOW (ref 135–145)
Total Bilirubin: 0.5 mg/dL (ref 0.3–1.2)
Total Protein: 6 g/dL — ABNORMAL LOW (ref 6.5–8.1)

## 2019-06-10 LAB — URINALYSIS, ROUTINE W REFLEX MICROSCOPIC
Bacteria, UA: NONE SEEN
Bilirubin Urine: NEGATIVE
Glucose, UA: >=500 mg/dL — AB
Hgb urine dipstick: NEGATIVE
Ketones, ur: NEGATIVE mg/dL
Leukocytes,Ua: NEGATIVE
Nitrite: NEGATIVE
Protein, ur: 30 mg/dL — AB
Specific Gravity, Urine: 1.02 (ref 1.005–1.030)
pH: 6 (ref 5.0–8.0)

## 2019-06-10 LAB — CBC
HCT: 36.5 % — ABNORMAL LOW (ref 39.0–52.0)
Hemoglobin: 11.7 g/dL — ABNORMAL LOW (ref 13.0–17.0)
MCH: 28.9 pg (ref 26.0–34.0)
MCHC: 32.1 g/dL (ref 30.0–36.0)
MCV: 90.1 fL (ref 80.0–100.0)
Platelets: 237 10*3/uL (ref 150–400)
RBC: 4.05 MIL/uL — ABNORMAL LOW (ref 4.22–5.81)
RDW: 15.3 % (ref 11.5–15.5)
WBC: 33.1 10*3/uL — ABNORMAL HIGH (ref 4.0–10.5)
nRBC: 0 % (ref 0.0–0.2)

## 2019-06-10 LAB — LIPASE, BLOOD: Lipase: 50 U/L (ref 11–51)

## 2019-06-10 MED ORDER — SODIUM CHLORIDE 0.9 % IV BOLUS
1000.0000 mL | Freq: Once | INTRAVENOUS | Status: AC
  Administered 2019-06-10: 09:00:00 1000 mL via INTRAVENOUS

## 2019-06-10 MED ORDER — DILTIAZEM HCL 25 MG/5ML IV SOLN
20.0000 mg | Freq: Once | INTRAVENOUS | Status: AC
  Administered 2019-06-10: 13:00:00 20 mg via INTRAVENOUS
  Filled 2019-06-10: qty 5

## 2019-06-10 MED ORDER — INSULIN ASPART 100 UNIT/ML ~~LOC~~ SOLN
12.0000 [IU] | Freq: Once | SUBCUTANEOUS | Status: AC
  Administered 2019-06-10: 12 [IU] via SUBCUTANEOUS
  Filled 2019-06-10: qty 0.12

## 2019-06-10 MED ORDER — ONDANSETRON HCL 4 MG/2ML IJ SOLN
4.0000 mg | Freq: Once | INTRAMUSCULAR | Status: AC
  Administered 2019-06-10: 4 mg via INTRAVENOUS
  Filled 2019-06-10: qty 2

## 2019-06-10 MED ORDER — HYDROMORPHONE HCL 1 MG/ML IJ SOLN
1.0000 mg | Freq: Once | INTRAMUSCULAR | Status: AC
  Administered 2019-06-10: 1 mg via INTRAVENOUS
  Filled 2019-06-10: qty 1

## 2019-06-10 MED ORDER — SODIUM CHLORIDE 0.9% FLUSH
3.0000 mL | Freq: Once | INTRAVENOUS | Status: AC
  Administered 2019-06-10: 3 mL via INTRAVENOUS

## 2019-06-10 NOTE — ED Triage Notes (Signed)
Pt reports to the ER with complaint of back pain and abdominal pain. Patient reports he self caths normally. Patient denies bowel issues.

## 2019-06-10 NOTE — ED Notes (Signed)
Wife at bedside.

## 2019-06-10 NOTE — ED Notes (Signed)
Pt verbalizes understanding of DC instructions. Pt belongings returned assisted out of ED in wheelchair

## 2019-06-10 NOTE — Discharge Instructions (Addendum)
It was our pleasure to provide your ER care today - we hope that you feel better.  Rest. Drink plenty of fluids.   From today's labs, your kidney function is slightly more elevated (creatinine 1.7) and glucose high (421) - drink plenty of fluids, eat balanced diet, continue diabetic medication, and follow up with your doctor.   Follow up with your doctor in the coming week.  Return to ER if worse, new symptoms, fevers, worsening or severe pain, persistent vomiting, increased trouble breathing, or other concern.  You were given strong pain medication in the ER - no driving for the next 8 hours, or if/when taking opiate type of pain medication.

## 2019-06-10 NOTE — ED Provider Notes (Addendum)
[facility] DEPT Provider Note   CSN: [id number] Arrival date & time: 06/10/19  0806     History   Chief Complaint Chief Complaint  Patient presents with  . Abdominal Pain  . Back Pain    HPI Brian French. is a 78 y.o. male.     Patient with hx stage III lung ca, c/o epigastric pain for 'long while' but worse in past couple days. Pain gradual onset, constant, dull, non radiating, mod-severe. Occurs at rest, no relation to activity. +nausea, and very poor po intake for past few days. Feels generally weak, lightheaded when stands. No focal or unilateral numbness or weakness. Denies headache. No chest pain or discomfort. Right chest pleurx cath in place, spouse indicates drainage steady, c/w prior.  Occasional non prod cough. No known covid + exposure. States saw his doctor/oncologist yesterday and had radiation tx. Denies dysuria. No mid to lower abd pain. No fever or chills.   The history is provided by the patient and the spouse.  Abdominal Pain Associated symptoms: nausea   Associated symptoms: no chest pain, no chills, no cough, no dysuria, no fever, no shortness of breath and no sore throat   Back Pain Associated symptoms: abdominal pain   Associated symptoms: no chest pain, no dysuria, no fever and no headaches     Past Medical History:  Diagnosis Date  . Atrial fibrillation ([place])   . Dementia ([place])   . Diabetes mellitus without complication ([place])   . Headache disorder 05/22/2015  . Hearing loss    bilateral - hearing aids  . Hyperlipidemia   . Hypertension     Patient Active Problem List   Diagnosis Date Noted  . Malignant neoplasm of lung ([place])   . Frontal mass of brain 05/29/2019  . Vasogenic brain edema ([place]) 05/29/2019  . Hilar mass 05/28/2019  . Hyponatremia 05/28/2019  . Metabolic acidosis, normal anion gap (NAG) 05/28/2019  . AF (paroxysmal atrial fibrillation) ([place]) 05/28/2019  . BPH (benign prostatic hyperplasia)  05/28/2019  . AAA (abdominal aortic aneurysm) ([place]) 05/28/2019  . Pneumonia due to infectious agent 05/27/2019  . Malignant pleural effusion 05/27/2019  . AKI (acute kidney injury) ([place]) 05/27/2019  . Hyperkalemia 05/27/2019  . Atrial fibrillation with RVR ([place]) 04/30/2019  . Acute pyelonephritis 04/30/2019  . Parapneumonic effusion 04/30/2019  . Type 2 diabetes mellitus with hyperlipidemia ([place]) 04/30/2019  . Community acquired pneumonia of right upper lobe of lung 04/29/2019  . Vertigo 05/22/2015  . Headache disorder 05/22/2015    Past Surgical History:  Procedure Laterality Date  . CHEST TUBE INSERTION N/A 06/01/2019   Procedure: INSERTION PLEURAL DRAINAGE CATHETER;  Surgeon: Julian Hy, DO;  Location: [facility];  Service: Thoracic;  Laterality: N/A;  . IR THORACENTESIS ASP PLEURAL SPACE W/IMG GUIDE  04/30/2019  . IR THORACENTESIS ASP PLEURAL SPACE W/IMG GUIDE  05/27/2019  . right testicular swelling     had I and D after swelling s/p trauma from scrotal  swelling, got hit in the testes during judo 1962  . VIDEO BRONCHOSCOPY WITH ENDOBRONCHIAL ULTRASOUND N/A 06/01/2019   Procedure: VIDEO BRONCHOSCOPY WITH ENDOBRONCHIAL ULTRASOUND;  Surgeon: Julian Hy, DO;  Location: [facility] OR;  Service: Thoracic;  Laterality: N/A;        Home Medications    Prior to Admission medications   Medication Sig Start Date End Date Taking? Authorizing Provider  acetaminophen (TYLENOL) 500 MG tablet Take 1,000 mg by mouth every 6 (six) hours as needed for  mild pain or headache.    [provider]  aspirin EC 81 MG tablet Take 81 mg by mouth daily.    [provider]  atorvastatin (LIPITOR) 80 MG tablet Take 80 mg by mouth daily at 6 PM.    [provider]  dexamethasone (DECADRON) 4 MG tablet Take 1 tablet (4 mg total) by mouth daily. 06/04/19 07/04/19  Donne Hazel, MD  diltiazem (CARDIZEM CD) 120 MG 24 hr capsule Take 1 capsule (120 mg total) by mouth daily. 05/03/19  05/02/20  Cherylann Ratel A, DO  glipiZIDE (GLUCOTROL) 10 MG tablet Take 10 mg by mouth 2 (two) times daily before a meal.    [provider]  HYDROcodone-acetaminophen (NORCO/VICODIN) 5-325 MG tablet Take 1-2 tablets every 6 hours as needed for severe pain 05/08/19   Carlisle Cater, PA-C  insulin aspart protamine- aspart (NOVOLOG MIX 70/30) (70-30) 100 UNIT/ML injection Inject 20 Units into the skin daily.    [provider]  lidocaine (LIDODERM) 5 % Place 1 patch onto the skin daily. Remove & Discard patch within 12 hours or as directed by MD 04/25/19   Gareth Morgan, MD  memantine (NAMENDA) 10 MG tablet Take 10 mg by mouth at bedtime.    [provider]  metFORMIN (GLUCOPHAGE) 500 MG tablet Take 500 mg by mouth 2 (two) times daily with a meal.     [provider]  Multiple Vitamin (MULTIVITAMIN WITH MINERALS) TABS tablet Take 1 tablet by mouth daily.    [provider]  Omega-3 Fatty Acids (FISH OIL) 1000 MG CAPS Take 2,000 mg by mouth 2 (two) times daily.    [provider]  omeprazole (PRILOSEC) 20 MG capsule Take 20 mg by mouth daily.    [provider]  ondansetron (ZOFRAN) 8 MG tablet Take 4 mg by mouth 3 (three) times daily as needed for nausea or vomiting (30 minutes before meals).    [provider]  pioglitazone (ACTOS) 45 MG tablet Take 45 mg by mouth daily.    [provider]  promethazine (PHENERGAN) 12.5 MG tablet Take 1 tablet (12.5 mg total) by mouth 3 (three) times daily. 06/07/19   Julian Hy, DO  senna (SENOKOT) 8.6 MG tablet Take 1 tablet by mouth daily.    [provider]  tamsulosin (FLOMAX) 0.4 MG CAPS capsule Take 0.4 mg by mouth daily.    [provider]    Family History Family History  Problem Relation Age of Onset  . Cancer Mother   . Heart disease Father     Social History Social History   Tobacco Use  . Smoking status: Former Smoker    Packs/day: 2.00     Types: Cigarettes    Quit date: 07/15/1997    Years since quitting: 21.9  . Smokeless tobacco: Never Used  Substance Use Topics  . Alcohol use: Yes    Alcohol/week: 0.0 standard drinks    Comment: occasionally  . Drug use: Never     Allergies   Patient has no known allergies.   Review of Systems Review of Systems  Constitutional: Negative for chills and fever.  HENT: Negative for sore throat.   Eyes: Negative for redness.  Respiratory: Negative for cough and shortness of breath.   Cardiovascular: Negative for chest pain and leg swelling.  Gastrointestinal: Positive for abdominal pain and nausea.  Endocrine: Negative for polyuria.  Genitourinary: Negative for dysuria and flank pain.  Musculoskeletal: Positive for back pain. Negative for  neck pain.  Skin: Negative for rash.  Neurological: Negative for headaches.  Hematological: Does not bruise/bleed easily.  Psychiatric/Behavioral: Negative for confusion.     Physical Exam Updated Vital Signs BP 130/90 (BP Location: Left Arm)   Pulse (!) 118   Temp (!) 97.5 F (36.4 C) (Oral)   Resp 18   SpO2 94%   Physical Exam Vitals signs and nursing note reviewed.  Constitutional:      Appearance: Normal appearance. He is well-developed.  HENT:     Head: Atraumatic.     Nose: Nose normal.     Mouth/Throat:     Mouth: Mucous membranes are moist.     Pharynx: Oropharynx is clear.  Eyes:     General: No scleral icterus.    Conjunctiva/sclera: Conjunctivae normal.     Pupils: Pupils are equal, round, and reactive to light.  Neck:     Musculoskeletal: Normal range of motion and neck supple. No neck rigidity.     Trachea: No tracheal deviation.  Cardiovascular:     Rate and Rhythm: Normal rate and regular rhythm.     Pulses: Normal pulses.     Heart sounds: Normal heart sounds. No murmur. No friction rub. No gallop.   Pulmonary:     Effort: Pulmonary effort is normal. No accessory muscle usage or respiratory distress.      Breath sounds: Normal breath sounds.  Abdominal:     General: Bowel sounds are normal. There is no distension.     Palpations: Abdomen is soft.     Tenderness: There is abdominal tenderness. There is no guarding.     Comments: Epigastric tenderness. No rebound or guarding.   Genitourinary:    Comments: No cva tenderness. Musculoskeletal:        General: No swelling or tenderness.  Skin:    General: Skin is warm and dry.     Findings: No rash.  Neurological:     Mental Status: He is alert.     Comments: Alert, speech clear.   Psychiatric:        Mood and Affect: Mood normal.      ED Treatments / Results  Labs (all labs ordered are listed, but only abnormal results are displayed) Results for orders placed or performed during the hospital encounter of 06/10/19  Lipase, blood  Result Value Ref Range   Lipase 50 11 - 51 U/L  Comprehensive metabolic panel  Result Value Ref Range   Sodium 131 (L) 135 - 145 mmol/L   Potassium 5.4 (H) 3.5 - 5.1 mmol/L   Chloride 98 98 - 111 mmol/L   CO2 23 22 - 32 mmol/L   Glucose, Bld 421 (H) 70 - 99 mg/dL   BUN 48 (H) 8 - 23 mg/dL   Creatinine, Ser 1.76 (H) 0.61 - 1.24 mg/dL   Calcium 8.6 (L) 8.9 - 10.3 mg/dL   Total Protein 6.0 (L) 6.5 - 8.1 g/dL   Albumin 2.4 (L) 3.5 - 5.0 g/dL   AST 11 (L) 15 - 41 U/L   ALT 18 0 - 44 U/L   Alkaline Phosphatase 89 38 - 126 U/L   Total Bilirubin 0.5 0.3 - 1.2 mg/dL   GFR calc non Af Amer 36 (L) >60 mL/min   GFR calc Af Amer 42 (L) >60 mL/min   Anion gap 10 5 - 15  CBC  Result Value Ref Range   WBC 33.1 (H) 4.0 - 10.5 K/uL   RBC 4.05 (L) 4.22 - 5.81  MIL/uL   Hemoglobin 11.7 (L) 13.0 - 17.0 g/dL   HCT 36.5 (L) 39.0 - 52.0 %   MCV 90.1 80.0 - 100.0 fL   MCH 28.9 26.0 - 34.0 pg   MCHC 32.1 30.0 - 36.0 g/dL   RDW 15.3 11.5 - 15.5 %   Platelets 237 150 - 400 K/uL   nRBC 0.0 0.0 - 0.2 %  Urinalysis, Routine w reflex microscopic  Result Value Ref Range   Color, Urine YELLOW YELLOW   APPearance CLEAR  CLEAR   Specific Gravity, Urine 1.020 1.005 - 1.030   pH 6.0 5.0 - 8.0   Glucose, UA >=500 (A) NEGATIVE mg/dL   Hgb urine dipstick NEGATIVE NEGATIVE   Bilirubin Urine NEGATIVE NEGATIVE   Ketones, ur NEGATIVE NEGATIVE mg/dL   Protein, ur 30 (A) NEGATIVE mg/dL   Nitrite NEGATIVE NEGATIVE   Leukocytes,Ua NEGATIVE NEGATIVE   RBC / HPF 0-5 0 - 5 RBC/hpf   Bacteria, UA NONE SEEN NONE SEEN     EKG None  Radiology No results found.  Procedures Procedures (including critical care time)  Medications Ordered in ED Medications  sodium chloride flush (NS) 0.9 % injection 3 mL (has no administration in time range)  HYDROmorphone (DILAUDID) injection 1 mg (has no administration in time range)  ondansetron (ZOFRAN) injection 4 mg (has no administration in time range)  sodium chloride 0.9 % bolus 1,000 mL (has no administration in time range)     Initial Impression / Assessment and Plan / ED Course  I have reviewed the triage vital signs and the nursing notes.  Pertinent labs & imaging results that were available during my care of the patient were reviewed by me and considered in my medical decision making (see chart for details).  Iv ns bolus. Continuous pulse ox and monitor. Labs sent. UA. Dilaudid iv. zofran iv.  Reviewed nursing notes and prior charts for additional history.   Labs reviewed/interpreted by me - lipase normal. Wbc elev - c/w prior.  Bun/cr mildly increased from prior - iv ns boluses.   Recheck  - pain improved w meds.   Glucose is very high, hco3 normal. novolog sq. Ivf.   Patient with hx afib, not on anticoag therapy due to hemorrhagic brain lesion/met. Hr 120s. cardizem bolus/gtt. No chest pain or discomfort. No sob.   CRITICAL CARE RE Metastatic lung cancer w weakness/dehydration and sev hyperglycemia, afib with rapid ventricular response.  Performed by: Mirna Mires Total critical care time: 35 minutes Critical care time was exclusive of separately  billable procedures and treating other patients. Critical care was necessary to treat or prevent imminent or life-threatening deterioration. Critical care was time spent personally by me on the following activities: development of treatment plan with patient and/or surrogate as well as nursing, discussions with consultants, evaluation of patient's response to treatment, examination of patient, obtaining history from patient or surrogate, ordering and performing treatments and interventions, ordering and review of laboratory studies, ordering and review of radiographic studies, pulse oximetry and re-evaluation of patient's condition.   Recheck again, pt indicates pain resolved and patient requests d/c to home.   Patient continues to insist/request d/c to home. Pt with serious/guarded prognosis due metastatic/progressive ca.   Recheck pt, hr improved, 88. rr 16. Pulse ox 99%.   Glucose improved. '  Pt insistent upon d/c to home.   Rec pcp f/u.  Return precautions provided.      Final Clinical Impressions(s) / ED Diagnoses   Final diagnoses:  None    ED Discharge Orders    None           Lajean Saver, MD 06/10/19 1335

## 2019-06-10 NOTE — ED Notes (Signed)
Messaged pharmacy for missing dose Cardizem

## 2019-06-14 ENCOUNTER — Inpatient Hospital Stay: Payer: Medicare Other | Admitting: Hematology

## 2019-06-14 ENCOUNTER — Other Ambulatory Visit: Payer: Self-pay

## 2019-06-14 ENCOUNTER — Telehealth: Payer: Self-pay | Admitting: Critical Care Medicine

## 2019-06-14 ENCOUNTER — Ambulatory Visit
Admission: RE | Admit: 2019-06-14 | Discharge: 2019-06-14 | Disposition: A | Discharge status: Home / Self Care | Payer: Medicare Other | Source: Ambulatory Visit | Attending: Radiation Oncology | Admitting: Radiation Oncology

## 2019-06-14 ENCOUNTER — Inpatient Hospital Stay: Payer: Medicare Other

## 2019-06-14 ENCOUNTER — Telehealth: Payer: Self-pay | Admitting: *Deleted

## 2019-06-14 NOTE — Telephone Encounter (Signed)
Spoke with Mr. Huffman(physical therapist) he states that after his evaluation with the patient his recommendation is PT once a week for 1 week, twice a week for 3 weeks, and then once a week for 4 weeks. I gave him a verbal that this was ok and he states that he will fax a form over for Dr. Carlis Abbott to sign.

## 2019-06-14 NOTE — Telephone Encounter (Signed)
L/M for Brian French to call back for order clarification.

## 2019-06-14 NOTE — Telephone Encounter (Signed)
Mr HuffmAN RETURNING CALL AND CAN BE REACHED @ [phone] OK TO LEAVE MSG.Hillery Hunter

## 2019-06-14 NOTE — Telephone Encounter (Signed)
Called patient to inform of Pet scan for 06-16-19 - arrival time- 11:30 am , pt. to be NPO- 6 hrs. prior to test, patient to hold morning insulin, test to be at [facility] Rad[facility], spoke with patient's wife and she is aware of this test

## 2019-06-15 ENCOUNTER — Other Ambulatory Visit: Payer: Self-pay

## 2019-06-15 ENCOUNTER — Telehealth: Payer: Self-pay | Admitting: *Deleted

## 2019-06-15 ENCOUNTER — Telehealth: Payer: Self-pay | Admitting: Critical Care Medicine

## 2019-06-15 ENCOUNTER — Ambulatory Visit
Admission: RE | Admit: 2019-06-15 | Discharge: 2019-06-15 | Disposition: A | Discharge status: Home / Self Care | Payer: Medicare Other | Source: Ambulatory Visit | Attending: Radiation Oncology | Admitting: Radiation Oncology

## 2019-06-15 DIAGNOSIS — Z87891 Personal history of nicotine dependence: Secondary | ICD-10-CM | POA: Insufficient documentation

## 2019-06-15 DIAGNOSIS — C3491 Malignant neoplasm of unspecified part of right bronchus or lung: Secondary | ICD-10-CM | POA: Insufficient documentation

## 2019-06-15 DIAGNOSIS — J910 Malignant pleural effusion: Secondary | ICD-10-CM | POA: Insufficient documentation

## 2019-06-15 DIAGNOSIS — Z510 Encounter for antineoplastic radiation therapy: Secondary | ICD-10-CM | POA: Insufficient documentation

## 2019-06-15 NOTE — Telephone Encounter (Signed)
Oncology Nurse Navigator Documentation  Oncology Nurse Navigator Flowsheets 06/15/2019  Navigator Follow Up Date: -  Navigator Follow Up Reason: -  Navigator Location [facility]  Navigator Encounter Type Telephone/I received referral on Brian French and noticed [employer] was involved in his care.  I called and spoke with the wife and she does not want to be seen due to so many appts.  I did contact [place] navigator and patient will be seen with them.  I will update new patient coordinator of this information.   Telephone Outgoing Call  Patient Visit Type -  Treatment Phase -  Barriers/Navigation Needs Coordination of Care;Education  Education Other  Interventions Coordination of Care;Education  Acuity Level 2-Minimal Needs (1-2 Barriers Identified)  Coordination of Care Other  Time Spent with Patient 30

## 2019-06-15 NOTE — Telephone Encounter (Signed)
Spoke with Brian French with [facility]  She states needing dx confirmed  I advised per records that the pt has dx of squamous cell CA right  She verbalized understanding and nothing further needed

## 2019-06-15 NOTE — Telephone Encounter (Signed)
Thank you!  LPC

## 2019-06-16 ENCOUNTER — Emergency Department (HOSPITAL_BASED_OUTPATIENT_CLINIC_OR_DEPARTMENT_OTHER): Payer: Medicare Other

## 2019-06-16 ENCOUNTER — Encounter (HOSPITAL_BASED_OUTPATIENT_CLINIC_OR_DEPARTMENT_OTHER): Payer: Self-pay

## 2019-06-16 ENCOUNTER — Inpatient Hospital Stay (HOSPITAL_BASED_OUTPATIENT_CLINIC_OR_DEPARTMENT_OTHER)
Admission: EM | Admit: 2019-06-16 | Discharge: 2019-06-21 | DRG: 308 | Disposition: A | Discharge status: Hospice | Payer: Medicare Other | Attending: Internal Medicine | Admitting: Internal Medicine

## 2019-06-16 ENCOUNTER — Ambulatory Visit: Payer: Medicare Other

## 2019-06-16 ENCOUNTER — Encounter (HOSPITAL_COMMUNITY): Admission: RE | Admit: 2019-06-16 | Payer: No Typology Code available for payment source | Source: Ambulatory Visit

## 2019-06-16 ENCOUNTER — Other Ambulatory Visit: Payer: Self-pay

## 2019-06-16 DIAGNOSIS — I1310 Hypertensive heart and chronic kidney disease without heart failure, with stage 1 through stage 4 chronic kidney disease, or unspecified chronic kidney disease: Secondary | ICD-10-CM | POA: Diagnosis present

## 2019-06-16 DIAGNOSIS — D72829 Elevated white blood cell count, unspecified: Secondary | ICD-10-CM | POA: Diagnosis present

## 2019-06-16 DIAGNOSIS — C7989 Secondary malignant neoplasm of other specified sites: Secondary | ICD-10-CM | POA: Diagnosis present

## 2019-06-16 DIAGNOSIS — E875 Hyperkalemia: Secondary | ICD-10-CM | POA: Diagnosis not present

## 2019-06-16 DIAGNOSIS — H9190 Unspecified hearing loss, unspecified ear: Secondary | ICD-10-CM | POA: Diagnosis present

## 2019-06-16 DIAGNOSIS — T380X5A Adverse effect of glucocorticoids and synthetic analogues, initial encounter: Secondary | ICD-10-CM | POA: Diagnosis not present

## 2019-06-16 DIAGNOSIS — J9601 Acute respiratory failure with hypoxia: Secondary | ICD-10-CM | POA: Diagnosis present

## 2019-06-16 DIAGNOSIS — E1122 Type 2 diabetes mellitus with diabetic chronic kidney disease: Secondary | ICD-10-CM | POA: Diagnosis present

## 2019-06-16 DIAGNOSIS — Z79899 Other long term (current) drug therapy: Secondary | ICD-10-CM

## 2019-06-16 DIAGNOSIS — C7931 Secondary malignant neoplasm of brain: Secondary | ICD-10-CM | POA: Diagnosis present

## 2019-06-16 DIAGNOSIS — E222 Syndrome of inappropriate secretion of antidiuretic hormone: Secondary | ICD-10-CM | POA: Diagnosis present

## 2019-06-16 DIAGNOSIS — Z7952 Long term (current) use of systemic steroids: Secondary | ICD-10-CM

## 2019-06-16 DIAGNOSIS — Z7189 Other specified counseling: Secondary | ICD-10-CM | POA: Diagnosis not present

## 2019-06-16 DIAGNOSIS — C7889 Secondary malignant neoplasm of other digestive organs: Secondary | ICD-10-CM | POA: Diagnosis present

## 2019-06-16 DIAGNOSIS — G9341 Metabolic encephalopathy: Secondary | ICD-10-CM | POA: Diagnosis present

## 2019-06-16 DIAGNOSIS — J910 Malignant pleural effusion: Secondary | ICD-10-CM | POA: Diagnosis present

## 2019-06-16 DIAGNOSIS — N1832 Chronic kidney disease, stage 3b: Secondary | ICD-10-CM | POA: Diagnosis present

## 2019-06-16 DIAGNOSIS — Z7901 Long term (current) use of anticoagulants: Secondary | ICD-10-CM

## 2019-06-16 DIAGNOSIS — E871 Hypo-osmolality and hyponatremia: Secondary | ICD-10-CM | POA: Diagnosis not present

## 2019-06-16 DIAGNOSIS — Z7982 Long term (current) use of aspirin: Secondary | ICD-10-CM

## 2019-06-16 DIAGNOSIS — I4891 Unspecified atrial fibrillation: Secondary | ICD-10-CM | POA: Diagnosis not present

## 2019-06-16 DIAGNOSIS — C7900 Secondary malignant neoplasm of unspecified kidney and renal pelvis: Secondary | ICD-10-CM | POA: Diagnosis present

## 2019-06-16 DIAGNOSIS — C787 Secondary malignant neoplasm of liver and intrahepatic bile duct: Secondary | ICD-10-CM | POA: Diagnosis present

## 2019-06-16 DIAGNOSIS — G9389 Other specified disorders of brain: Secondary | ICD-10-CM | POA: Diagnosis not present

## 2019-06-16 DIAGNOSIS — N400 Enlarged prostate without lower urinary tract symptoms: Secondary | ICD-10-CM | POA: Diagnosis present

## 2019-06-16 DIAGNOSIS — R739 Hyperglycemia, unspecified: Secondary | ICD-10-CM

## 2019-06-16 DIAGNOSIS — C7972 Secondary malignant neoplasm of left adrenal gland: Secondary | ICD-10-CM | POA: Diagnosis present

## 2019-06-16 DIAGNOSIS — Z794 Long term (current) use of insulin: Secondary | ICD-10-CM

## 2019-06-16 DIAGNOSIS — Z20828 Contact with and (suspected) exposure to other viral communicable diseases: Secondary | ICD-10-CM | POA: Diagnosis present

## 2019-06-16 DIAGNOSIS — C3411 Malignant neoplasm of upper lobe, right bronchus or lung: Secondary | ICD-10-CM | POA: Diagnosis not present

## 2019-06-16 DIAGNOSIS — G936 Cerebral edema: Secondary | ICD-10-CM | POA: Diagnosis present

## 2019-06-16 DIAGNOSIS — C786 Secondary malignant neoplasm of retroperitoneum and peritoneum: Secondary | ICD-10-CM | POA: Diagnosis present

## 2019-06-16 DIAGNOSIS — C3431 Malignant neoplasm of lower lobe, right bronchus or lung: Secondary | ICD-10-CM | POA: Diagnosis present

## 2019-06-16 DIAGNOSIS — C3490 Malignant neoplasm of unspecified part of unspecified bronchus or lung: Secondary | ICD-10-CM | POA: Diagnosis present

## 2019-06-16 DIAGNOSIS — Z515 Encounter for palliative care: Secondary | ICD-10-CM

## 2019-06-16 DIAGNOSIS — Z9181 History of falling: Secondary | ICD-10-CM

## 2019-06-16 DIAGNOSIS — E1165 Type 2 diabetes mellitus with hyperglycemia: Secondary | ICD-10-CM | POA: Diagnosis present

## 2019-06-16 DIAGNOSIS — E860 Dehydration: Secondary | ICD-10-CM | POA: Diagnosis present

## 2019-06-16 DIAGNOSIS — N179 Acute kidney failure, unspecified: Secondary | ICD-10-CM | POA: Diagnosis not present

## 2019-06-16 DIAGNOSIS — K8681 Exocrine pancreatic insufficiency: Secondary | ICD-10-CM | POA: Diagnosis present

## 2019-06-16 DIAGNOSIS — Z8249 Family history of ischemic heart disease and other diseases of the circulatory system: Secondary | ICD-10-CM

## 2019-06-16 DIAGNOSIS — Z8701 Personal history of pneumonia (recurrent): Secondary | ICD-10-CM

## 2019-06-16 DIAGNOSIS — Z66 Do not resuscitate: Secondary | ICD-10-CM | POA: Diagnosis not present

## 2019-06-16 DIAGNOSIS — E785 Hyperlipidemia, unspecified: Secondary | ICD-10-CM | POA: Diagnosis present

## 2019-06-16 DIAGNOSIS — M8458XA Pathological fracture in neoplastic disease, other specified site, initial encounter for fracture: Secondary | ICD-10-CM | POA: Diagnosis present

## 2019-06-16 DIAGNOSIS — F0390 Unspecified dementia without behavioral disturbance: Secondary | ICD-10-CM | POA: Diagnosis present

## 2019-06-16 DIAGNOSIS — D631 Anemia in chronic kidney disease: Secondary | ICD-10-CM | POA: Diagnosis present

## 2019-06-16 DIAGNOSIS — I480 Paroxysmal atrial fibrillation: Principal | ICD-10-CM | POA: Diagnosis present

## 2019-06-16 DIAGNOSIS — D696 Thrombocytopenia, unspecified: Secondary | ICD-10-CM | POA: Diagnosis present

## 2019-06-16 DIAGNOSIS — Z87891 Personal history of nicotine dependence: Secondary | ICD-10-CM

## 2019-06-16 DIAGNOSIS — Z923 Personal history of irradiation: Secondary | ICD-10-CM

## 2019-06-16 HISTORY — DX: Malignant (primary) neoplasm, unspecified: C80.1

## 2019-06-16 LAB — CBC WITH DIFFERENTIAL/PLATELET
Abs Immature Granulocytes: 1.29 10*3/uL — ABNORMAL HIGH (ref 0.00–0.07)
Basophils Absolute: 0.1 10*3/uL (ref 0.0–0.1)
Basophils Relative: 0 %
Eosinophils Absolute: 3.1 10*3/uL — ABNORMAL HIGH (ref 0.0–0.5)
Eosinophils Relative: 12 %
HCT: 38.1 % — ABNORMAL LOW (ref 39.0–52.0)
Hemoglobin: 12.3 g/dL — ABNORMAL LOW (ref 13.0–17.0)
Immature Granulocytes: 5 %
Lymphocytes Relative: 4 %
Lymphs Abs: 1.1 10*3/uL (ref 0.7–4.0)
MCH: 29.1 pg (ref 26.0–34.0)
MCHC: 32.3 g/dL (ref 30.0–36.0)
MCV: 90.3 fL (ref 80.0–100.0)
Monocytes Absolute: 1.5 10*3/uL — ABNORMAL HIGH (ref 0.1–1.0)
Monocytes Relative: 6 %
Neutro Abs: 19.6 10*3/uL — ABNORMAL HIGH (ref 1.7–7.7)
Neutrophils Relative %: 73 %
Platelets: 167 10*3/uL (ref 150–400)
RBC: 4.22 MIL/uL (ref 4.22–5.81)
RDW: 15.4 % (ref 11.5–15.5)
Smear Review: NORMAL
WBC: 26.7 10*3/uL — ABNORMAL HIGH (ref 4.0–10.5)
nRBC: 0 % (ref 0.0–0.2)

## 2019-06-16 LAB — URINALYSIS, ROUTINE W REFLEX MICROSCOPIC
Bilirubin Urine: NEGATIVE
Glucose, UA: >=500 mg/dL — AB
Hgb urine dipstick: NEGATIVE
Ketones, ur: NEGATIVE mg/dL
Leukocytes,Ua: NEGATIVE
Nitrite: NEGATIVE
Protein, ur: NEGATIVE mg/dL
Specific Gravity, Urine: 1.01 (ref 1.005–1.030)
pH: 6.5 (ref 5.0–8.0)

## 2019-06-16 LAB — URINALYSIS, MICROSCOPIC (REFLEX)

## 2019-06-16 LAB — SARS CORONAVIRUS 2 (TAT 6-24 HRS): SARS Coronavirus 2: NEGATIVE

## 2019-06-16 LAB — CBG MONITORING, ED
Glucose-Capillary: 497 mg/dL — ABNORMAL HIGH (ref 70–99)
Glucose-Capillary: 411 mg/dL — ABNORMAL HIGH (ref 70–99)
Glucose-Capillary: 257 mg/dL — ABNORMAL HIGH (ref 70–99)
Glucose-Capillary: 259 mg/dL — ABNORMAL HIGH (ref 70–99)

## 2019-06-16 LAB — COMPREHENSIVE METABOLIC PANEL
ALT: 15 U/L (ref 0–44)
AST: 13 U/L — ABNORMAL LOW (ref 15–41)
Albumin: 2.3 g/dL — ABNORMAL LOW (ref 3.5–5.0)
Alkaline Phosphatase: 128 U/L — ABNORMAL HIGH (ref 38–126)
Anion gap: 12 (ref 5–15)
BUN: 60 mg/dL — ABNORMAL HIGH (ref 8–23)
CO2: 23 mmol/L (ref 22–32)
Calcium: 8.7 mg/dL — ABNORMAL LOW (ref 8.9–10.3)
Chloride: 94 mmol/L — ABNORMAL LOW (ref 98–111)
Creatinine, Ser: 1.8 mg/dL — ABNORMAL HIGH (ref 0.61–1.24)
GFR calc Af Amer: 41 mL/min — ABNORMAL LOW (ref >60)
GFR calc non Af Amer: 36 mL/min — ABNORMAL LOW (ref >60)
Glucose, Bld: 515 mg/dL (ref 70–99)
Potassium: 5.3 mmol/L — ABNORMAL HIGH (ref 3.5–5.1)
Sodium: 129 mmol/L — ABNORMAL LOW (ref 135–145)
Total Bilirubin: 0.7 mg/dL (ref 0.3–1.2)
Total Protein: 6.1 g/dL — ABNORMAL LOW (ref 6.5–8.1)

## 2019-06-16 LAB — LACTIC ACID, PLASMA
Lactic Acid, Venous: 3.2 mmol/L (ref 0.5–1.9)
Lactic Acid, Venous: 2.6 mmol/L (ref 0.5–1.9)

## 2019-06-16 LAB — LIPASE, BLOOD: Lipase: 40 U/L (ref 11–51)

## 2019-06-16 LAB — TROPONIN I (HIGH SENSITIVITY)
Troponin I (High Sensitivity): 7 ng/L (ref <18)
Troponin I (High Sensitivity): 6 ng/L (ref <18)

## 2019-06-16 MED ORDER — DILTIAZEM HCL 100 MG IV SOLR
INTRAVENOUS | Status: AC
  Filled 2019-06-16: qty 100

## 2019-06-16 MED ORDER — SODIUM CHLORIDE 0.9 % IV SOLN
INTRAVENOUS | Status: DC
  Administered 2019-06-16 – 2019-06-19 (×6): via INTRAVENOUS

## 2019-06-16 MED ORDER — MEMANTINE HCL 10 MG PO TABS
10.0000 mg | ORAL_TABLET | Freq: Every day | ORAL | Status: DC
  Administered 2019-06-16 – 2019-06-20 (×4): 10 mg via ORAL
  Filled 2019-06-16: qty 1
  Filled 2019-06-16 (×3): qty 2
  Filled 2019-06-16: qty 1

## 2019-06-16 MED ORDER — ACETAMINOPHEN 325 MG PO TABS
650.0000 mg | ORAL_TABLET | Freq: Four times a day (QID) | ORAL | Status: DC | PRN
  Administered 2019-06-19: 650 mg via ORAL
  Filled 2019-06-16: qty 2

## 2019-06-16 MED ORDER — MORPHINE SULFATE (PF) 2 MG/ML IV SOLN
2.0000 mg | INTRAVENOUS | Status: DC | PRN
  Administered 2019-06-16 – 2019-06-17 (×3): 2 mg via INTRAVENOUS
  Filled 2019-06-16 (×3): qty 1

## 2019-06-16 MED ORDER — IOHEXOL 350 MG/ML SOLN
80.0000 mL | Freq: Once | INTRAVENOUS | Status: AC | PRN
  Administered 2019-06-16: 80 mL via INTRAVENOUS

## 2019-06-16 MED ORDER — SODIUM CHLORIDE 0.9 % IV BOLUS (SEPSIS)
500.0000 mL | Freq: Once | INTRAVENOUS | Status: AC
  Administered 2019-06-16: 500 mL via INTRAVENOUS

## 2019-06-16 MED ORDER — ENOXAPARIN SODIUM 40 MG/0.4ML ~~LOC~~ SOLN: 40.0000 mg | SUBCUTANEOUS | Status: DC

## 2019-06-16 MED ORDER — CHLORHEXIDINE GLUCONATE CLOTH 2 % EX PADS
6.0000 | MEDICATED_PAD | Freq: Every day | CUTANEOUS | Status: DC
  Administered 2019-06-16 – 2019-06-21 (×6): 6 via TOPICAL

## 2019-06-16 MED ORDER — ONDANSETRON HCL 4 MG/2ML IJ SOLN
4.0000 mg | Freq: Once | INTRAMUSCULAR | Status: AC
  Administered 2019-06-16: 4 mg via INTRAVENOUS
  Filled 2019-06-16: qty 2

## 2019-06-16 MED ORDER — MORPHINE SULFATE (PF) 4 MG/ML IV SOLN
4.0000 mg | Freq: Once | INTRAVENOUS | Status: AC
  Administered 2019-06-16: 4 mg via INTRAVENOUS
  Filled 2019-06-16: qty 1

## 2019-06-16 MED ORDER — TAMSULOSIN HCL 0.4 MG PO CAPS
0.4000 mg | ORAL_CAPSULE | Freq: Every day | ORAL | Status: DC
  Administered 2019-06-16 – 2019-06-21 (×5): 0.4 mg via ORAL
  Filled 2019-06-16 (×5): qty 1

## 2019-06-16 MED ORDER — ASPIRIN EC 81 MG PO TBEC
81.0000 mg | DELAYED_RELEASE_TABLET | Freq: Every day | ORAL | Status: DC
  Administered 2019-06-16 – 2019-06-21 (×5): 81 mg via ORAL
  Filled 2019-06-16 (×5): qty 1

## 2019-06-16 MED ORDER — INSULIN REGULAR HUMAN 100 UNIT/ML IJ SOLN
10.0000 [IU] | Freq: Once | INTRAMUSCULAR | Status: AC
  Administered 2019-06-16: 10 [IU] via SUBCUTANEOUS
  Filled 2019-06-16: qty 1

## 2019-06-16 MED ORDER — DILTIAZEM HCL 100 MG IV SOLR
5.0000 mg/h | INTRAVENOUS | Status: DC
  Administered 2019-06-16: 10 mg/h via INTRAVENOUS
  Administered 2019-06-16: 5 mg/h via INTRAVENOUS
  Filled 2019-06-16: qty 100

## 2019-06-16 MED ORDER — QUETIAPINE FUMARATE 50 MG PO TABS
50.0000 mg | ORAL_TABLET | Freq: Every day | ORAL | Status: DC
  Administered 2019-06-16 – 2019-06-20 (×4): 50 mg via ORAL
  Filled 2019-06-16 (×5): qty 1

## 2019-06-16 MED ORDER — ONDANSETRON HCL 4 MG PO TABS: 4.0000 mg | ORAL_TABLET | Freq: Four times a day (QID) | ORAL | Status: DC | PRN

## 2019-06-16 MED ORDER — ACETAMINOPHEN 650 MG RE SUPP: 650.0000 mg | Freq: Four times a day (QID) | RECTAL | Status: DC | PRN

## 2019-06-16 MED ORDER — DOCUSATE SODIUM 100 MG PO CAPS
100.0000 mg | ORAL_CAPSULE | Freq: Every day | ORAL | Status: DC
  Administered 2019-06-16 – 2019-06-21 (×3): 100 mg via ORAL
  Filled 2019-06-16 (×4): qty 1

## 2019-06-16 MED ORDER — DILTIAZEM HCL-DEXTROSE 125-5 MG/125ML-% IV SOLN (PREMIX)
5.0000 mg/h | INTRAVENOUS | Status: DC
  Administered 2019-06-16: 10 mg/h via INTRAVENOUS
  Filled 2019-06-16: qty 125

## 2019-06-16 MED ORDER — DILTIAZEM LOAD VIA INFUSION
10.0000 mg | Freq: Once | INTRAVENOUS | Status: AC
  Administered 2019-06-16: 10 mg via INTRAVENOUS
  Filled 2019-06-16: qty 10

## 2019-06-16 MED ORDER — PANTOPRAZOLE SODIUM 40 MG PO TBEC
40.0000 mg | DELAYED_RELEASE_TABLET | Freq: Every day | ORAL | Status: DC
  Administered 2019-06-16 – 2019-06-21 (×5): 40 mg via ORAL
  Filled 2019-06-16 (×5): qty 1

## 2019-06-16 MED ORDER — ENOXAPARIN SODIUM 40 MG/0.4ML ~~LOC~~ SOLN
40.0000 mg | SUBCUTANEOUS | Status: DC
  Administered 2019-06-16: 40 mg via SUBCUTANEOUS
  Filled 2019-06-16 (×2): qty 0.4

## 2019-06-16 MED ORDER — ONDANSETRON HCL 4 MG/2ML IJ SOLN: 4.0000 mg | Freq: Four times a day (QID) | INTRAMUSCULAR | Status: DC | PRN

## 2019-06-16 MED ORDER — MORPHINE SULFATE (PF) 4 MG/ML IV SOLN: 4.0000 mg | INTRAVENOUS | Status: DC | PRN

## 2019-06-16 MED FILL — Insulin Regular (Human) Inj 100 Unit/ML: INTRAMUSCULAR | Qty: 0.1 | Status: AC

## 2019-06-16 NOTE — ED Provider Notes (Signed)
Plan is to admit patient to [facility] long for atrial fibrillation with RVR, dehydration, hyperglycemia.  Patient with newly diagnosed lung cancer follows at the [facility] but sees radiation oncology at [facility] long.  Has a right-sided Pleur-evac.  Awaiting CT scans of his chest, abdomen, pelvis as patient has ongoing right-sided pain.  Home pain medication has not helped.  He is on Decadron for brain metastasis.  Per patient he was supposed have a PET scan today at [facility].  CT scan of the chest, abdomen, pelvis shows extensive metastasis in kidneys, liver, omentum, adrenals.  No PE, no dissection.  Lactic acid slowly improving to 2.6.  Blood sugar improving to 411.  Continues to have atrial fibrillation with RVR while on diltiazem drip.  Appears to have pathologic right-sided rib fractures as well.  Will give additional IV morphine.  Will admit to [facility] long for further hydration and care.  Would likely benefit from oncology/palliative care consult.  This chart was dictated using voice recognition software.  Despite best efforts to proofread,  errors can occur which can change the documentation meaning.     Lennice Sites, DO 06/16/19 4332

## 2019-06-16 NOTE — ED Notes (Addendum)
Pt's family member going home to rest and change.  Aware pt is still awaiting bed assignment.  Gave contact info if needed: Gerald Stabs ([place]) [phone] (C) [phone]

## 2019-06-16 NOTE — ED Notes (Signed)
Pt given water per RN

## 2019-06-16 NOTE — ED Notes (Signed)
Family contacted to notify of transfer to [facility] spoke to [place]

## 2019-06-16 NOTE — ED Triage Notes (Signed)
Pt c/o lower right chronic back pain worsening tonight.  Hx of Ca. Wife gave oxycodone with no relief.

## 2019-06-16 NOTE — ED Notes (Signed)
Date and time results received: 06/16/19  (use smartphrase ".now" to insert current time)  Test: Glucose, Lactic Critical Value: Glucose- 515, Lactic- 3.2  Name of Provider Notified: [facility]

## 2019-06-16 NOTE — ED Notes (Signed)
Pt and family aware awaiting bed assignment

## 2019-06-16 NOTE — Plan of Care (Signed)
This is a 78 year old male with metastatic lung cancer with mets to the brain being transferred from [facility] with right-sided chest pain found to have pathology clear rib fractures 4 and 10 ribs with meta stasis to the liver kidney and omentum by CT scan.  While in the ER he will also was in A. fib RVR started on Cardizem drip.  Initially this patient was going to going to telemetry once the Cardizem drip was started his admission was changed to stepdown unit.  In addition to the above he is also dehydrated and needs pain control.  Patient has history of recurrent pleural effusion and has a Pleurx catheter in place.  He gets his radiation therapy at [facility].  He was due to have a PET scan today.  He follows up with oncology at [facility].  He will need a palliative care consult.  He has groundglass opacities in his CT scan his rapid Covid is negative.  Repeat Covid is pending.  He will be admitted to stepdown unit with a diagnosis of A. fib RVR, dehydration, intractable pain.

## 2019-06-16 NOTE — ED Provider Notes (Signed)
TIME SEEN: 4:49 AM  CHIEF COMPLAINT: Chest pain, back pain, abdominal pain, shortness of breath  HPI: Patient is a 78 year old male with history of squamous cell carcinoma of the right lung with malignant right pleural effusion status post Pleurx catheter and likely brain metastasis currently being followed by Dr. Lisbeth Renshaw with radiation oncology who presents to the emergency department for multiple complaints.  Patient's wife states that he began complaining of right back pain.  He states that it is in his mid and upper back on the right side and this is pain that he has had since he was diagnosed with cancer in October 2020.  Wife reports that his pain tonight was not controlled with oxycodone.  She does report that he did have a fall 2 days ago but did not injure his back in the fall and did not hit his head.  No other injury.    Wife reports that his R Pleurx catheter has been draining appropriately.  She drains it daily and states that the appearance and amount of fluid has not changed.  She last drained it last night.  He is currently on Decadron for the brain metastasis was diagnosed by MRI May 29, 2019.  He is scheduled for PET scan later today.  He has not started chemotherapy.  Last radiation yesterday.  Patient also complaining of some central chest pain that he is unable to describe other than stating it is "painful".  He states he feels short of breath as well.  Currently in atrial fibrillation with RVR.  He is on Cardizem daily.  His Eliquis was stopped after his last admission 05/27/19 - 06/04/19 due to hemorrhage around the brain metastasis.  Wife reports compliance with medications.  Wife also reports he has had decreased oral intake recently.  He has had nausea and vomiting.  Patient is complaining of abdominal pain mostly in the upper abdomen.  No diarrhea.  No known fever.  No COVID exposures.  Did have a 3 cm infrarenal AAA seen on CT imaging 04/29/19.  Denies focal numbness or  weakness.  Able to ambulate.  No bowel or bladder incontinence.  Denies lower back pain.  No known history of PE or DVT.  No lower extremity swelling or pain.  On review of patent's records, it appears he was hospitalized in 04/30/19 - 05/03/19 for community-acquired pneumonia, acute pyelonephritis, a fib with RVR.  Hospitalized 05/27/19 - 06/04/19 for pneumonia, respiratory failure secondary to pleural effusion and probable lung cancer, right frontal lobe brain mass, metabolic encephalopathy is thought secondary to brain mass and steroids, A. 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  ROS: See HPI but limited secondary to history of dementia and patient being hard of hearing Constitutional: no fever  Eyes: no drainage  ENT: no runny nose   Cardiovascular:   chest pain  Resp:  SOB  GI:  vomiting GU: no dysuria Integumentary: no rash  Allergy: no hives  Musculoskeletal: no leg swelling  Neurological: no slurred speech ROS otherwise negative  PAST MEDICAL HISTORY/PAST SURGICAL HISTORY:  Past Medical History:  Diagnosis Date  . Atrial fibrillation ([place])   . Cancer ([facility])    Squamous Cell Carcinoma  . Dementia ([place])   . Diabetes mellitus without complication ([place])   . Headache disorder 05/22/2015  . Hearing loss    bilateral - hearing aids  . Hyperlipidemia   . Hypertension     MEDICATIONS:  Prior to Admission medications  Medication Sig Start Date End Date Taking? Authorizing Provider  acetaminophen (TYLENOL) 500 MG tablet Take 1,000 mg by mouth every 6 (six) hours as needed for mild pain or headache.    [provider]  aspirin EC 81 MG tablet Take 81 mg by mouth daily.    [provider]  atorvastatin (LIPITOR) 80 MG tablet Take 80 mg by mouth daily at 6 PM.    [provider]  dexamethasone (DECADRON) 4 MG tablet Take 1 tablet (4 mg total) by mouth daily. 06/04/19 07/04/19  Donne Hazel, MD   diltiazem (CARDIZEM CD) 120 MG 24 hr capsule Take 1 capsule (120 mg total) by mouth daily. 05/03/19 05/02/20  Cherylann Ratel A, DO  HYDROcodone-acetaminophen (NORCO/VICODIN) 5-325 MG tablet Take 1-2 tablets every 6 hours as needed for severe pain 05/08/19   Carlisle Cater, PA-C  insulin aspart (NOVOLOG) 100 UNIT/ML injection Inject 20 Units into the skin once.    [provider]  lidocaine (LIDODERM) 5 % Place 1 patch onto the skin daily. Remove & Discard patch within 12 hours or as directed by MD 04/25/19   Gareth Morgan, MD  memantine (NAMENDA) 10 MG tablet Take 10 mg by mouth at bedtime.    [provider]  Multiple Vitamin (MULTIVITAMIN WITH MINERALS) TABS tablet Take 1 tablet by mouth daily.    [provider]  Omega-3 Fatty Acids (FISH OIL) 1000 MG CAPS Take 2,000 mg by mouth 2 (two) times daily.    [provider]  omeprazole (PRILOSEC) 20 MG capsule Take 20 mg by mouth daily.    [provider]  ondansetron (ZOFRAN) 8 MG tablet Take 4 mg by mouth 3 (three) times daily as needed for nausea or vomiting (30 minutes before meals).    [provider]  pioglitazone (ACTOS) 45 MG tablet Take 45 mg by mouth daily.    [provider]  promethazine (PHENERGAN) 12.5 MG tablet Take 1 tablet (12.5 mg total) by mouth 3 (three) times daily. 06/07/19   Julian Hy, DO  senna (SENOKOT) 8.6 MG tablet Take 1 tablet by mouth daily.    [provider]  tamsulosin (FLOMAX) 0.4 MG CAPS capsule Take 0.4 mg by mouth daily.    [provider]    ALLERGIES:  No Known Allergies  SOCIAL HISTORY:  Social History   Tobacco Use  . Smoking status: Former Smoker    Packs/day: 2.00    Types: Cigarettes    Quit date: 07/15/1997    Years since quitting: 21.9  . Smokeless tobacco: Never Used  Substance Use Topics  . Alcohol use: Yes    Alcohol/week: 0.0 standard drinks    Comment: occasionally    FAMILY HISTORY: Family History   Problem Relation Age of Onset  . Cancer Mother   . Heart disease Father     EXAM: BP (!) 143/70 (BP Location: Right Arm)   Pulse (!) 107 Comment: Irregular; Hx of afib  Temp (!) 96.3 F (35.7 C) (Axillary)   Resp 20   Ht 5\' 11"  (1.803 m)   Wt 92.1 kg   SpO2 94%   BMI 28.31 kg/m  CONSTITUTIONAL: Alert and oriented and responds appropriately to questions.  Elderly, appears uncomfortable HEAD: Normocephalic EYES: Conjunctivae clear, pupils appear equal, EOM appear intact ENT: normal nose; moist mucous membranes NECK: Supple, normal ROM CARD: Early irregular and tachycardic; S1 and S2 appreciated; no murmurs, no clicks, no rubs, no gallops CHEST: Patient has a Pleurx catheter  in the right lateral chest without surrounding redness, warmth, bleeding or drainage. RESP: Patient is tachypneic.  He has diminished breath sounds in his right lung base.  No rhonchi, wheezing or rales.  No hypoxia.  He does appear short of breath but is able to speak full sentences. ABD/GI: Normal bowel sounds; non-distended; soft, diffusely tender throughout the upper abdomen with intermittent guarding, no rebound BACK:  The back appears normal, there is no midline spinal tenderness or step-off or deformity, he does appear to be tender over the right scapular area without redness, warmth, swelling or ecchymosis EXT: Normal ROM in all joints; no deformity noted, no edema; no cyanosis, no calf tenderness or swelling noted SKIN: Normal color for age and race; warm; no rash on exposed skin NEURO: Moves all extremities equally, reports normal sensation diffusely, normal speech PSYCH: The patient's mood and manner are appropriate.   MEDICAL DECISION MAKING: Patient here with complaints of worsening chest pain, right-sided thoracic back pain, shortness of breath and abdominal pain.  Wife reports poor oral intake with nausea and vomiting.  It seems many of these issues are chronic but have acutely worsened over the past  day.  Patient seems mostly concerned at this time about pain control.  Will provide with morphine, Zofran.  Will gently hydrate patient as well.  I am concerned that his pleural effusion may have worsened or this could be underlying pneumonia, PE.  Will obtain a chest x-ray but also likely proceed with a CTA of the chest to evaluate for PE.  As for his atrial fibrillation with RVR, will start diltiazem infusion and give a bolus.  He is having active chest pain that he is unable to describe.  This may be related to his pleural effusion but will check troponin.  He does have multiple risk factors for ACS including hypertension, hyperlipidemia.  Also complaining of abdominal pain.  Was recently treated for pyelonephritis.  Will check urinalysis, abdominal labs and obtain a CT of the abdomen pelvis.  Differential includes UTI, pyelonephritis, colitis, diverticulitis, appendicitis, bowel obstruction.  Patient was in the emergency department 06/10/2019 for similar symptoms.  At that time admission was recommended but patient and wife wanted discharge home.  Wife states "I have not been too happy" during patient's previous admissions.  Patient states today however that he is willing to be admitted to the hospital.  ED PROGRESS: 6:40 AM  Patient's blood sugar today is elevated to 497.  He does have a history of uncontrolled type 2 diabetes.  Likely worsened today secondary to Decadron use.  Patient does have a leukocytosis of 26,000 that is actually downtrending likely secondary to Decadron use.  Chest x-ray shows no acute abnormality or recurrent effusion.  Urine shows no sign of infection.  Lactate is elevated but this may be in the setting of dehydration and recent radiation.  I do not think this is sepsis.  We are hydrating patient currently.  Troponin is negative.  Heart rate currently in the 110's to 120s and no longer in the 130s to 140s on diltiazem.  Sodium today is 129.  Potassium slightly elevated  at 5.3.  He has a mild AKI today compared to his chronic kidney disease with creatinine of baseline 1.3.  Glucose is elevated at 515 with normal bicarb and normal anion gap.  He is receiving IV fluids and subcutaneous insulin.  CT is pending for further evaluation.  Will sign out to oncoming ED physician to follow-up on CT reads and  admit patient to medicine.  Repeat troponin, lactate and repeat blood sugar pending as well.  Pain has improved significantly with morphine.  Patient and family comfortable with plan for admission.  I reviewed all nursing notes and pertinent previous records as available.  I have interpreted any EKGs, lab and urine results, imaging (as available).     EKG Interpretation  Date/Time:  Wednesday June 16 2019 04:36:10 EST Ventricular Rate:  141 PR Interval:    QRS Duration: 82 QT Interval:  330 QTC Calculation: 509 R Axis:   50 Text Interpretation: Atrial fibrillation which patient has had previously occassional PVCs Low voltage, extremity and precordial leads Nonspecific T abnormalities, lateral leads Prolonged QT interval Rate faster than previous Confirmed by Pryor Curia [phone]) on 06/16/2019 5:07:40 AM       CRITICAL CARE Performed by: Cyril Mourning Ward   Total critical care time: 50 minutes  Critical care time was exclusive of separately billable procedures and treating other patients.  Critical care was necessary to treat or prevent imminent or life-threatening deterioration.  Critical care was time spent personally by me on the following activities: development of treatment plan with patient and/or surrogate as well as nursing, discussions with consultants, evaluation of patient's response to treatment, examination of patient, obtaining history from patient or surrogate, ordering and performing treatments and interventions, ordering and review of laboratory studies, ordering and review of radiographic studies, pulse oximetry and re-evaluation of  patient's condition.   Masiah Woody. was evaluated in Emergency Department on 06/16/2019 for the symptoms described in the history of present illness. He was evaluated in the context of the global COVID-19 pandemic, which necessitated consideration that the patient might be at risk for infection with the SARS-CoV-2 virus that causes COVID-19. Institutional protocols and algorithms that pertain to the evaluation of patients at risk for COVID-19 are in a state of rapid change based on information released by regulatory bodies including the CDC and federal and state organizations. These policies and algorithms were followed during the patient's care in the ED.  Patient was seen wearing N95, face shield, gloves.    Ward, Delice Bison, DO 06/16/19 [phone]

## 2019-06-17 ENCOUNTER — Encounter: Payer: Self-pay | Admitting: *Deleted

## 2019-06-17 ENCOUNTER — Ambulatory Visit
Admission: RE | Admit: 2019-06-17 | Discharge: 2019-06-17 | Disposition: A | Discharge status: Home / Self Care | Payer: Medicare Other | Source: Ambulatory Visit | Attending: Radiation Oncology | Admitting: Radiation Oncology

## 2019-06-17 ENCOUNTER — Other Ambulatory Visit: Payer: Self-pay | Admitting: *Deleted

## 2019-06-17 ENCOUNTER — Telehealth: Payer: Self-pay | Admitting: Radiation Oncology

## 2019-06-17 DIAGNOSIS — C3411 Malignant neoplasm of upper lobe, right bronchus or lung: Secondary | ICD-10-CM

## 2019-06-17 DIAGNOSIS — I480 Paroxysmal atrial fibrillation: Principal | ICD-10-CM

## 2019-06-17 DIAGNOSIS — G936 Cerebral edema: Secondary | ICD-10-CM

## 2019-06-17 DIAGNOSIS — G9341 Metabolic encephalopathy: Secondary | ICD-10-CM | POA: Diagnosis present

## 2019-06-17 LAB — BASIC METABOLIC PANEL
Anion gap: 13 (ref 5–15)
Anion gap: 15 (ref 5–15)
BUN: 54 mg/dL — ABNORMAL HIGH (ref 8–23)
BUN: 60 mg/dL — ABNORMAL HIGH (ref 8–23)
CO2: 19 mmol/L — ABNORMAL LOW (ref 22–32)
CO2: 16 mmol/L — ABNORMAL LOW (ref 22–32)
Calcium: 7.6 mg/dL — ABNORMAL LOW (ref 8.9–10.3)
Calcium: 7.7 mg/dL — ABNORMAL LOW (ref 8.9–10.3)
Chloride: 98 mmol/L (ref 98–111)
Chloride: 99 mmol/L (ref 98–111)
Creatinine, Ser: 1.66 mg/dL — ABNORMAL HIGH (ref 0.61–1.24)
Creatinine, Ser: 1.72 mg/dL — ABNORMAL HIGH (ref 0.61–1.24)
GFR calc Af Amer: 45 mL/min — ABNORMAL LOW (ref >60)
GFR calc Af Amer: 43 mL/min — ABNORMAL LOW (ref >60)
GFR calc non Af Amer: 39 mL/min — ABNORMAL LOW (ref >60)
GFR calc non Af Amer: 38 mL/min — ABNORMAL LOW (ref >60)
Glucose, Bld: 511 mg/dL (ref 70–99)
Glucose, Bld: 599 mg/dL (ref 70–99)
Potassium: 5.8 mmol/L — ABNORMAL HIGH (ref 3.5–5.1)
Potassium: 5.1 mmol/L (ref 3.5–5.1)
Sodium: 130 mmol/L — ABNORMAL LOW (ref 135–145)
Sodium: 130 mmol/L — ABNORMAL LOW (ref 135–145)

## 2019-06-17 LAB — HEMOGLOBIN A1C
Hgb A1c MFr Bld: 10.5 % — ABNORMAL HIGH (ref 4.8–5.6)
Mean Plasma Glucose: 254.65 mg/dL

## 2019-06-17 LAB — GLUCOSE, CAPILLARY: Glucose-Capillary: 497 mg/dL — ABNORMAL HIGH (ref 70–99)

## 2019-06-17 LAB — COMPREHENSIVE METABOLIC PANEL
ALT: 14 U/L (ref 0–44)
AST: 7 U/L — ABNORMAL LOW (ref 15–41)
Albumin: 1.9 g/dL — ABNORMAL LOW (ref 3.5–5.0)
Alkaline Phosphatase: 96 U/L (ref 38–126)
Anion gap: 9 (ref 5–15)
BUN: 55 mg/dL — ABNORMAL HIGH (ref 8–23)
CO2: 23 mmol/L (ref 22–32)
Calcium: 7.8 mg/dL — ABNORMAL LOW (ref 8.9–10.3)
Chloride: 98 mmol/L (ref 98–111)
Creatinine, Ser: 1.67 mg/dL — ABNORMAL HIGH (ref 0.61–1.24)
GFR calc Af Amer: 45 mL/min — ABNORMAL LOW (ref >60)
GFR calc non Af Amer: 39 mL/min — ABNORMAL LOW (ref >60)
Glucose, Bld: 306 mg/dL — ABNORMAL HIGH (ref 70–99)
Potassium: 5.6 mmol/L — ABNORMAL HIGH (ref 3.5–5.1)
Sodium: 130 mmol/L — ABNORMAL LOW (ref 135–145)
Total Bilirubin: 1.1 mg/dL (ref 0.3–1.2)
Total Protein: 4.9 g/dL — ABNORMAL LOW (ref 6.5–8.1)

## 2019-06-17 LAB — URINE CULTURE: Culture: NO GROWTH

## 2019-06-17 LAB — LACTIC ACID, PLASMA: Lactic Acid, Venous: 1.5 mmol/L (ref 0.5–1.9)

## 2019-06-17 MED ORDER — ENSURE ENLIVE PO LIQD
237.0000 mL | Freq: Three times a day (TID) | ORAL | Status: DC
  Administered 2019-06-17 – 2019-06-20 (×7): 237 mL via ORAL

## 2019-06-17 MED ORDER — INSULIN ASPART 100 UNIT/ML ~~LOC~~ SOLN
0.0000 [IU] | Freq: Every day | SUBCUTANEOUS | Status: DC
  Administered 2019-06-20: 2 [IU] via SUBCUTANEOUS

## 2019-06-17 MED ORDER — DEXAMETHASONE 4 MG PO TABS
4.0000 mg | ORAL_TABLET | Freq: Every day | ORAL | Status: DC
  Administered 2019-06-17 – 2019-06-21 (×4): 4 mg via ORAL
  Filled 2019-06-17: qty 2
  Filled 2019-06-17 (×2): qty 1
  Filled 2019-06-17: qty 2

## 2019-06-17 MED ORDER — BISACODYL 5 MG PO TBEC: 5.0000 mg | DELAYED_RELEASE_TABLET | Freq: Every day | ORAL | Status: DC | PRN

## 2019-06-17 MED ORDER — SODIUM POLYSTYRENE SULFONATE 15 GM/60ML PO SUSP
30.0000 g | Freq: Once | ORAL | Status: AC
  Administered 2019-06-17: 30 g via ORAL
  Filled 2019-06-17: qty 120

## 2019-06-17 MED ORDER — DILTIAZEM HCL 30 MG PO TABS
60.0000 mg | ORAL_TABLET | Freq: Four times a day (QID) | ORAL | Status: DC
  Administered 2019-06-17 – 2019-06-21 (×12): 60 mg via ORAL
  Filled 2019-06-17 (×3): qty 1
  Filled 2019-06-17: qty 2
  Filled 2019-06-17: qty 1
  Filled 2019-06-17 (×3): qty 2
  Filled 2019-06-17: qty 1
  Filled 2019-06-17 (×2): qty 2
  Filled 2019-06-17 (×4): qty 1
  Filled 2019-06-17: qty 2

## 2019-06-17 MED ORDER — INSULIN ASPART 100 UNIT/ML ~~LOC~~ SOLN: 3.0000 [IU] | Freq: Three times a day (TID) | SUBCUTANEOUS | Status: DC

## 2019-06-17 MED ORDER — SODIUM ZIRCONIUM CYCLOSILICATE 10 G PO PACK
10.0000 g | PACK | Freq: Two times a day (BID) | ORAL | Status: DC
  Administered 2019-06-17: 10 g via ORAL
  Filled 2019-06-17 (×2): qty 1

## 2019-06-17 MED ORDER — INSULIN ASPART 100 UNIT/ML ~~LOC~~ SOLN
20.0000 [IU] | Freq: Once | SUBCUTANEOUS | Status: AC
  Administered 2019-06-17: 20 [IU] via SUBCUTANEOUS

## 2019-06-17 MED ORDER — ADULT MULTIVITAMIN W/MINERALS CH
1.0000 | ORAL_TABLET | Freq: Every day | ORAL | Status: DC
  Administered 2019-06-17 – 2019-06-21 (×4): 1 via ORAL
  Filled 2019-06-17 (×4): qty 1

## 2019-06-17 MED ORDER — HALOPERIDOL LACTATE 5 MG/ML IJ SOLN
5.0000 mg | Freq: Once | INTRAMUSCULAR | Status: AC
  Administered 2019-06-18: 5 mg via INTRAVENOUS
  Filled 2019-06-17: qty 1

## 2019-06-17 MED ORDER — INSULIN ASPART 100 UNIT/ML ~~LOC~~ SOLN
0.0000 [IU] | Freq: Three times a day (TID) | SUBCUTANEOUS | Status: DC
  Administered 2019-06-17: 15 [IU] via SUBCUTANEOUS
  Administered 2019-06-18 (×3): 3 [IU] via SUBCUTANEOUS
  Administered 2019-06-19 (×2): 5 [IU] via SUBCUTANEOUS
  Administered 2019-06-19: 15 [IU] via SUBCUTANEOUS
  Administered 2019-06-20: 5 [IU] via SUBCUTANEOUS
  Administered 2019-06-20: 15 [IU] via SUBCUTANEOUS
  Administered 2019-06-20: 11 [IU] via SUBCUTANEOUS
  Administered 2019-06-21: 5 [IU] via SUBCUTANEOUS
  Administered 2019-06-21: 11 [IU] via SUBCUTANEOUS

## 2019-06-17 NOTE — Telephone Encounter (Signed)
I called the patient's daughter to let her know that we were aware of her father's hospitalization. We will continue with his palliative radiotherapy treatments. He was to see palliative care today for pain management recommendations. The patient's daughter states that with her father's dementia, and her mother's feeling of being overwhelmed by the recent diagnosis, that she would really like to be involved in the discussion with his providers and doctors. I suggest she try to touch base with the nurse caring for him to see if there is any way she can accompany her mother to see her dad and be another advocate for his care. We will continue plans to complete radiotherapy as they contemplate systemic options, but they are keenly aware of the recent imaging that documented progressive disease.     Carola Rhine, PAC

## 2019-06-17 NOTE — Progress Notes (Signed)
[facility] Therapy Treatment Record Phone [phone]   Radiation Therapy was administered to Brian French. on: 06/17/2019  1:56 PM and was treatment # 5 out of a planned course of 10 treatments.  Radiation Treatment  1). Beam photons with 6-10 energy   2). Brachytherapy None  3). Stereotactic Radiosurgery None  4). Other Radiation None     Leighton Ruff, RTR

## 2019-06-17 NOTE — Progress Notes (Signed)
Oncology Nurse Navigator Documentation  Oncology Nurse Navigator Flowsheets 06/17/2019  Navigator Follow Up Date: -  Navigator Follow Up Reason: -  Navigator Location [facility]  Navigator Encounter Type Other/per Dr. Julien Nordmann I requested I notified pathology to send PDL 1 testing on case # [id number].   Telephone -  Patient Visit Type -  Treatment Phase -  Barriers/Navigation Needs Coordination of Care  Education -  Interventions Coordination of Care  Acuity Level 2-Minimal Needs (1-2 Barriers Identified)  Coordination of Care Other  Time Spent with Patient 15

## 2019-06-17 NOTE — Progress Notes (Signed)
Initial Nutrition Assessment  DOCUMENTATION CODES:   Not applicable  INTERVENTION:  - will order Ensure Enlive TID, each supplement provides 350 kcal and 20 grams of protein. - will order Magic Cup BID with meals, each supplement provides 290 kcal and 9 grams of protein. - will order daily multivitamin with minerals. - continue to encourage PO intake and advance diet as medically feasible.    NUTRITION DIAGNOSIS:   Increased nutrient needs related to cancer and cancer related treatments, catabolic illness, acute illness as evidenced by estimated needs.  GOAL:   Patient will meet greater than or equal to 90% of their needs  MONITOR:   PO intake, Supplement acceptance, Diet advancement, Labs, Weight trends  REASON FOR ASSESSMENT:   Malnutrition Screening Tool  ASSESSMENT:   78 y.o. male with medical history of A. fib, recent diagnosis of with squamous cell carcinoma of RLL, undergoing initial rounds of radiation therapy. He presented to the ED from home with worsening back pain. Patient lives with his elderly wife. CXR in the ED showed likely pathological fractures to ribs. He has known mets to multiple organs. In the ED he was a/o to self only.  Patient laying in bed with no family or visitors at bedside. He remains confused and flow sheet documentation indicates that he is a/o to self only. At times he makes sense and is able to have a conversation with RD and at other times he mumbles incomprehensibly and/or talks about things that do not make sense. Patient indicates that he is having nausea and back and chest/abdominal pain. Suspect chest/abdominal pain is 2/2 rib fractures noted on admission.   No PO intakes documented since admission. Per chart review, current weight is 201 lb and weight on 11/23 was 207 lb. This indicates 6 lb weight loss (2.9% body weight) in the past 10 days; significant for time frame. Patient at high risk for malnutrition.   Per notes: - afib with  RVR - malignant pleural effusion - frontal brain mass, vasogenic brain edema - malignant neoplasm of lung - acute metabolic encephalopathy - plan for PT and Case Manager consults to aid with discharge planning   Labs reviewed; Na: 130 mmol/l, K: 5.6 mmol/l, BUN: 55 mmol/l, creatinine: 1.67 mg/dl, Ca: 7.8 mg/dl, GFR: 39 ml/min. Medications reviewed; 100 mg colace/day, 40 mg oral protonix/day.  IVF; NS @ 75 ml/hr.    NUTRITION - FOCUSED PHYSICAL EXAM:    Most Recent Value  Orbital Region  No depletion  Upper Arm Region  Mild depletion  Thoracic and Lumbar Region  No depletion  Buccal Region  No depletion  Temple Region  No depletion  Clavicle Bone Region  Mild depletion  Clavicle and Acromion Bone Region  No depletion  Scapular Bone Region  Unable to assess  Dorsal Hand  No depletion  Patellar Region  No depletion  Anterior Thigh Region  Unable to assess  Posterior Calf Region  No depletion  Edema (RD Assessment)  Mild [BLE]  Hair  Reviewed  Eyes  Reviewed  Mouth  Unable to assess  Skin  Reviewed  Nails  Reviewed       Diet Order:   Diet Order            Diet full liquid Room service appropriate? Yes; Fluid consistency: Thin  Diet effective now              EDUCATION NEEDS:   Not appropriate for education at this time  Skin:  Skin Assessment: Skin  Integrity Issues: Skin Integrity Issues:: Incisions, Other (Comment) Incisions: R chest (11/17) Other: non-pressure injury to L buttocks  Last BM:  unknown  Height:   Ht Readings from Last 1 Encounters:  06/16/19 5\' 11"  (1.803 m)    Weight:   Wt Readings from Last 1 Encounters:  06/17/19 91.1 kg    Ideal Body Weight:  78.2 kg  BMI:  Body mass index is 28.01 kg/m.  Estimated Nutritional Needs:   Kcal:  2100-2300 kcal  Protein:  110-125 grams  Fluid:  >/= 2.2 L/day     Jarome Matin, MS, RD, LDN, [facility] Inpatient Clinical Dietitian Pager # [phone] After hours/weekend pager # [phone]

## 2019-06-17 NOTE — H&P (Signed)
TRH H&P   Patient Demographics:    Brian French, is a 78 y.o. male  MRN: [id number]   DOB - September 01, 1940  Admit Date - 06/16/2019  Outpatient Primary MD for the patient is [facility], [facility]  Outpatient Specialists: Radiation oncology  Patient coming from: Home  Chief Complaint  Patient presents with   Back Pain      HPI:    Brian French  is a 78 y.o. male, paroxysmal A. fib, recently diagnosed with squamous cell cancers of right lower lobe, undergoing initial rounds of radiation therapy, presents with worsening back pain.  Patient lives at home with his elderly wife.  He was taken to [facility], CXR revealed likely pathological fractures in the ribs.  Patient with known metastasis to multiple organs.  Patient is currently confused, oriented to name only, family is not available at this time.  History obtained from reviewing the medical chart and staff.   Review of systems:  Unable to assess due to patient's current mental status  With Past History of the following :   Past Medical History:  Diagnosis Date   Atrial fibrillation ([place])    Cancer ([place])    Squamous Cell Carcinoma   Dementia ([place])    Diabetes mellitus without complication ([place])    Headache disorder 05/22/2015   Hearing loss    bilateral - hearing aids   Hyperlipidemia    Hypertension       Past Surgical History:  Procedure Laterality Date   CHEST TUBE INSERTION N/A 06/01/2019   Procedure: INSERTION PLEURAL DRAINAGE CATHETER;  Surgeon: Julian Hy, DO;  Location: [place];  Service: Thoracic;  Laterality: N/A;   IR THORACENTESIS ASP PLEURAL SPACE W/IMG GUIDE  04/30/2019   IR THORACENTESIS ASP PLEURAL SPACE W/IMG GUIDE   05/27/2019   right testicular swelling     had I and D after swelling s/p trauma from scrotal  swelling, got hit in the testes during judo [place] N/A 06/01/2019   Procedure: VIDEO BRONCHOSCOPY WITH ENDOBRONCHIAL ULTRASOUND;  Surgeon: Julian Hy, DO;  Location: [place];  Service: Thoracic;  Laterality: N/A;     Social History:     Social History   Tobacco Use   Smoking status: Former Smoker    Packs/day: 2.00    Types:  Cigarettes    Quit date: 07/15/1997    Years since quitting: 21.9   Smokeless tobacco: Never Used  Substance Use Topics   Alcohol use: Yes    Alcohol/week: 0.0 standard drinks    Comment: occasionally    Family History :     Family History  Problem Relation Age of Onset   Cancer Mother    Heart disease Father      Home Medications:   Prior to Admission medications   Medication Sig Start Date End Date Taking? Authorizing Provider  acetaminophen (TYLENOL) 500 MG tablet Take 1,000 mg by mouth every 6 (six) hours as needed for mild pain or headache.   Yes [provider]  aspirin EC 81 MG tablet Take 81 mg by mouth daily.   Yes [provider]  dexamethasone (DECADRON) 4 MG tablet Take 1 tablet (4 mg total) by mouth daily. 06/04/19 07/04/19 Yes Donne Hazel, MD  HYDROcodone-acetaminophen (NORCO/VICODIN) 5-325 MG tablet Take 1-2 tablets every 6 hours as needed for severe pain 05/08/19  Yes Carlisle Cater, PA-C  insulin aspart (NOVOLOG) 100 UNIT/ML injection Inject 20 Units into the skin as needed for high blood sugar.    Yes [provider]  lidocaine (LIDODERM) 5 % Place 1 patch onto the skin daily. Remove & Discard patch within 12 hours or as directed by MD 04/25/19  Yes Gareth Morgan, MD  memantine (NAMENDA) 10 MG tablet Take 10 mg by mouth at bedtime.   Yes [provider]  Multiple Vitamin (MULTIVITAMIN WITH MINERALS) TABS tablet Take 1 tablet by mouth daily.    Yes [provider]  Omega-3 Fatty Acids (FISH OIL) 1000 MG CAPS Take 2,000 mg by mouth 2 (two) times daily.   Yes [provider]  omeprazole (PRILOSEC) 20 MG capsule Take 20 mg by mouth daily.   Yes [provider]  ondansetron (ZOFRAN) 8 MG tablet Take 4 mg by mouth 3 (three) times daily as needed for nausea or vomiting (30 minutes before meals).   Yes [provider]  pioglitazone (ACTOS) 45 MG tablet Take 45 mg by mouth daily.   Yes [provider]  promethazine (PHENERGAN) 12.5 MG tablet Take 1 tablet (12.5 mg total) by mouth 3 (three) times daily. 06/07/19  Yes Julian Hy, DO  senna (SENOKOT) 8.6 MG tablet Take 1 tablet by mouth daily.   Yes [provider]  tamsulosin (FLOMAX) 0.4 MG CAPS capsule Take 0.4 mg by mouth daily.   Yes [provider]  diltiazem (CARDIZEM CD) 120 MG 24 hr capsule Take 1 capsule (120 mg total) by mouth daily. 05/03/19 05/02/20  Cherylann Ratel A, DO     Allergies:    No Known Allergies   Physical Exam:   Vitals  Blood pressure (!) 103/52, pulse 96, temperature 98.8 F (37.1 C), temperature source Axillary, resp. rate 14, height 5\' 11"  (1.803 m), weight 92.1 kg, SpO2 92 %.  Physical Exam   Constitutional - resting comfortably, no acute distress Eyes - pupils equal round and reactive to light and accomodation, extra ocular movements intact Nose - no gross deformity or drainage Mouth - no oral lesions noted Throat - no swelling or erythema Neck - supple, no JVD   CV - (+)S1S2, no murmurs  Resp - CTA bilaterally, no wheezing or crackles,  GI - (+)BS, soft, non-tender, non-distended Extrem - no clubbing, cyanosis, or peripheral edema  Skin - no rashes or wounds Neuro - alert, aware, oriented to  person only Psych -unable to assess  Patient has Pressure Ulcer on Admission?: no   Data Review:    CBC Recent Labs  Lab 06/10/19 0907 06/16/19 0500  WBC 33.1* 26.7*  HGB 11.7* 12.3*    HCT 36.5* 38.1*  PLT 237 167  MCV 90.1 90.3  MCH 28.9 29.1  MCHC 32.1 32.3  RDW 15.3 15.4  LYMPHSABS  --  1.1  MONOABS  --  1.5*  EOSABS  --  3.1*  BASOSABS  --  0.1   ------------------------------------------------------------------------------------------------------------------  Chemistries  Recent Labs  Lab 06/10/19 0907 06/16/19 0500 06/17/19 0220  NA 131* 129* 130*  K 5.4* 5.3* 5.6*  CL 98 94* 98  CO2 23 23 23   GLUCOSE 421* 515* 306*  BUN 48* 60* 55*  CREATININE 1.76* 1.80* 1.67*  CALCIUM 8.6* 8.7* 7.8*  AST 11* 13* 7*  ALT 18 15 14   ALKPHOS 89 128* 96  BILITOT 0.5 0.7 1.1   ------------------------------------------------------------------------------------------------------------------ estimated creatinine clearance is 43 mL/min (A) (by C-G formula based on SCr of 1.67 mg/dL (H)). ------------------------------------------------------------------------------------------------------------------ No results for input(s): TSH, T4TOTAL, T3FREE, THYROIDAB in the last 72 hours.  Invalid input(s): FREET3  Coagulation profile No results for input(s): INR, PROTIME in the last 168 hours. ------------------------------------------------------------------------------------------------------------------- No results for input(s): DDIMER in the last 72 hours. -------------------------------------------------------------------------------------------------------------------  Cardiac Enzymes No results for input(s): CKMB, TROPONINI, MYOGLOBIN in the last 168 hours.  Invalid input(s): CK ------------------------------------------------------------------------------------------------------------------ No results found for: BNP   ---------------------------------------------------------------------------------------------------------------  Urinalysis    Component Value Date/Time   COLORURINE YELLOW 06/16/2019 [place] 06/16/2019 0555   LABSPEC 1.010  06/16/2019 0555   PHURINE 6.5 06/16/2019 0555   GLUCOSEU >=500 (A) 06/16/2019 0555   HGBUR NEGATIVE 06/16/2019 [place] 06/16/2019 0555   KETONESUR NEGATIVE 06/16/2019 0555   PROTEINUR NEGATIVE 06/16/2019 0555   NITRITE NEGATIVE 06/16/2019 0555   LEUKOCYTESUR NEGATIVE 06/16/2019 0555    ----------------------------------------------------------------------------------------------------------------   Imaging Results:    Ct Angio Chest Pe W And/or Wo Contrast  Result Date: 06/16/2019 CLINICAL DATA:  Right-sided chest pain. History of right lung cancer. EXAM: CT ANGIOGRAPHY CHEST CT ABDOMEN AND PELVIS WITH CONTRAST TECHNIQUE: Multidetector CT imaging of the chest was performed using the standard protocol during bolus administration of intravenous contrast. Multiplanar CT image reconstructions and MIPs were obtained to evaluate the vascular anatomy. Multidetector CT imaging of the abdomen and pelvis was performed using the standard protocol during bolus administration of intravenous contrast. CONTRAST:  67mL OMNIPAQUE IOHEXOL 350 MG/ML SOLN COMPARISON:  CT chest 05/28/2019 and CT abdomen/pelvis 05/08/2019 FINDINGS: CTA CHEST FINDINGS Cardiovascular: The heart is within normal limits in size for age. No pericardial effusion. There is mild tortuosity, ectasia and calcification of the thoracic aorta. Three-vessel coronary artery calcifications are noted. The pulmonary arterial tree is fairly well opacified. No filling defects to suggest pulmonary embolism. Mediastinum/Nodes: Stable bulk E right mediastinal tumor in the subcarinal region surrounding the bronchus intermedius and extending into the right infrahilar region. This measures a maximum of 5.2 x 5.0 cm. Stable 9 mm paratracheal lymph node on image number 31/4. The esophagus is grossly normal. Lungs/Pleura: Persistent large right middle lobe lung mass invading the right infrahilar region. Associated complex/loculated right pleural  fluid collection containing a PleurX drainage catheter. Ill-defined nodularity/infiltrating tumor in the epicardial fat. Persistent ground-glass opacity at the right lung apex measuring 2.8 x 2.7 cm. There is also a 15 mm nodule associated with the right major fissure which  is likely loculated fluid. The left lung is relatively clear. Musculoskeletal: Probable pathologic fracture involving the right fourth anterior rib which may account for the patient's chest pain. There may also be a pathologic fracture of the tenth rib laterally. Remote healed right tenth rib fracture. Review of the MIP images confirms the above findings. CT ABDOMEN and PELVIS FINDINGS Hepatobiliary: Progressive hepatic metastatic disease. 2.7 cm lesion in segment 2 previously measured 1.5 cm. 14 mm lesion in segment 8 appears to be new. There is also an ill-defined 2.5 cm lesion in the subdiaphragmatic fat anterior to the liver on image 22/11 New 10 mm lesion in segment 6. The gallbladder appears normal.  No common bile duct dilatation. Pancreas: There is a new ill-defined lesion in the pancreatic tail measuring 2 cm on image 32/11 which is likely a metastasis. Spleen: Normal size.  No focal lesions. Adrenals/Urinary Tract: Enlarging left adrenal gland metastasis measuring 2.5 cm and previously measuring 1.5 cm. Numerous ill-defined enlarging renal lesions most likely metastatic disease. Lower pole right renal lesion anteriorly measures 3.2 cm and previously measured 2.3 cm. 2.8 cm midpole left renal lesion previously measured 1.9 cm. Enlarging bladder mass at the dome. Stomach/Bowel: The stomach, duodenum, small bowel and colon are grossly normal. No acute inflammatory changes, mass lesions or obstructive findings. Vascular/Lymphatic: Advanced atherosclerotic calcifications involving the aorta and iliac arteries and branch vessels. Numerous small mesenteric and omental implants are noted throughout the abdomen. Index omental lesion on image  54/11 measures 19 mm. No retroperitoneal lymphadenopathy. Reproductive: The prostate gland and seminal vesicles are unremarkable. Other: No pelvic mass or pelvic lymphadenopathy. Pelvic implants are noted. Musculoskeletal: Do not see any obvious metastatic bone lesions. There may be a small lytic lesion just above the right acetabulum. Review of the MIP images confirms the above findings. IMPRESSION: 1. No CT findings for pulmonary embolism. 2. No aortic aneurysm or dissection. 3. Right fourth and tenth rib fractures, likely pathologic and may account for the patient's right-sided chest pain. 4. Extensive tumor involving the right hemithorax as detailed above. Findings progressive since prior study. 5. Progressive metastatic disease involving the abdomen with enlarging and new hepatic metastatic lesions, enlarging left adrenal gland metastasis, enlarging bilateral renal metastasis and a new pancreatic tail metastasis. There also numerous new and enlarging mesenteric and omental implants in the abdomen/pelvis. 6. Suspect enlarging bladder dome mass. 7. Persistent ground-glass opacity in the right upper lobe. Electronically Signed   By: Marijo Sanes M.D.   On: 06/16/2019 07:14   Ct Abdomen Pelvis W Contrast  Result Date: 06/16/2019 CLINICAL DATA:  Right-sided chest pain. History of right lung cancer. EXAM: CT ANGIOGRAPHY CHEST CT ABDOMEN AND PELVIS WITH CONTRAST TECHNIQUE: Multidetector CT imaging of the chest was performed using the standard protocol during bolus administration of intravenous contrast. Multiplanar CT image reconstructions and MIPs were obtained to evaluate the vascular anatomy. Multidetector CT imaging of the abdomen and pelvis was performed using the standard protocol during bolus administration of intravenous contrast. CONTRAST:  32mL OMNIPAQUE IOHEXOL 350 MG/ML SOLN COMPARISON:  CT chest 05/28/2019 and CT abdomen/pelvis 05/08/2019 FINDINGS: CTA CHEST FINDINGS Cardiovascular: The heart is  within normal limits in size for age. No pericardial effusion. There is mild tortuosity, ectasia and calcification of the thoracic aorta. Three-vessel coronary artery calcifications are noted. The pulmonary arterial tree is fairly well opacified. No filling defects to suggest pulmonary embolism. Mediastinum/Nodes: Stable bulk E right mediastinal tumor in the subcarinal region surrounding the bronchus intermedius and extending into the  right infrahilar region. This measures a maximum of 5.2 x 5.0 cm. Stable 9 mm paratracheal lymph node on image number 31/4. The esophagus is grossly normal. Lungs/Pleura: Persistent large right middle lobe lung mass invading the right infrahilar region. Associated complex/loculated right pleural fluid collection containing a PleurX drainage catheter. Ill-defined nodularity/infiltrating tumor in the epicardial fat. Persistent ground-glass opacity at the right lung apex measuring 2.8 x 2.7 cm. There is also a 15 mm nodule associated with the right major fissure which is likely loculated fluid. The left lung is relatively clear. Musculoskeletal: Probable pathologic fracture involving the right fourth anterior rib which may account for the patient's chest pain. There may also be a pathologic fracture of the tenth rib laterally. Remote healed right tenth rib fracture. Review of the MIP images confirms the above findings. CT ABDOMEN and PELVIS FINDINGS Hepatobiliary: Progressive hepatic metastatic disease. 2.7 cm lesion in segment 2 previously measured 1.5 cm. 14 mm lesion in segment 8 appears to be new. There is also an ill-defined 2.5 cm lesion in the subdiaphragmatic fat anterior to the liver on image 22/11 New 10 mm lesion in segment 6. The gallbladder appears normal.  No common bile duct dilatation. Pancreas: There is a new ill-defined lesion in the pancreatic tail measuring 2 cm on image 32/11 which is likely a metastasis. Spleen: Normal size.  No focal lesions. Adrenals/Urinary Tract:  Enlarging left adrenal gland metastasis measuring 2.5 cm and previously measuring 1.5 cm. Numerous ill-defined enlarging renal lesions most likely metastatic disease. Lower pole right renal lesion anteriorly measures 3.2 cm and previously measured 2.3 cm. 2.8 cm midpole left renal lesion previously measured 1.9 cm. Enlarging bladder mass at the dome. Stomach/Bowel: The stomach, duodenum, small bowel and colon are grossly normal. No acute inflammatory changes, mass lesions or obstructive findings. Vascular/Lymphatic: Advanced atherosclerotic calcifications involving the aorta and iliac arteries and branch vessels. Numerous small mesenteric and omental implants are noted throughout the abdomen. Index omental lesion on image 54/11 measures 19 mm. No retroperitoneal lymphadenopathy. Reproductive: The prostate gland and seminal vesicles are unremarkable. Other: No pelvic mass or pelvic lymphadenopathy. Pelvic implants are noted. Musculoskeletal: Do not see any obvious metastatic bone lesions. There may be a small lytic lesion just above the right acetabulum. Review of the MIP images confirms the above findings. IMPRESSION: 1. No CT findings for pulmonary embolism. 2. No aortic aneurysm or dissection. 3. Right fourth and tenth rib fractures, likely pathologic and may account for the patient's right-sided chest pain. 4. Extensive tumor involving the right hemithorax as detailed above. Findings progressive since prior study. 5. Progressive metastatic disease involving the abdomen with enlarging and new hepatic metastatic lesions, enlarging left adrenal gland metastasis, enlarging bilateral renal metastasis and a new pancreatic tail metastasis. There also numerous new and enlarging mesenteric and omental implants in the abdomen/pelvis. 6. Suspect enlarging bladder dome mass. 7. Persistent ground-glass opacity in the right upper lobe. Electronically Signed   By: Marijo Sanes M.D.   On: 06/16/2019 07:14   Dg Chest Portable  1 View  Result Date: 06/16/2019 CLINICAL DATA:  Chest pain.  History of lung cancer. EXAM: PORTABLE CHEST 1 VIEW COMPARISON:  06/01/2019 FINDINGS: Right-sided PleurX drainage type catheter in place. The cardiac silhouette, mediastinal and hilar contours are within normal limits and stable. Persistent right basilar density, likely a combination of tumor and atelectasis but no large recurrent effusion. The left lung remains clear. IMPRESSION: 1. Stable PleurX drainage catheter without recurrent effusion. 2. Stable right medial basilar  density. Electronically Signed   By: Marijo Sanes M.D.   On: 06/16/2019 05:37     Assessment & Plan:    Principal Problem:   Paroxysmal atrial fibrillation with rapid ventricular response (HCC) Active Problems:   Malignant pleural effusion   Frontal mass of brain   Vasogenic brain edema (HCC)   Malignant neoplasm of lung (HCC)   Acute metabolic encephalopathy    Paroxysmal atrial fibrillation with rapid ventricular response: Patient presented with worsening back pain, found to have likely pathological fractures of the ribs.  Was also in A. fib with RVR, started on Cardizem drip.  He is on p.o. Cardizem at home, start p.o. Cardizem and wean off IV drip as tolerated.    Malignant pleural effusion/Frontal mass of brain/Vasogenic brain edema/Malignant neoplasm of lung/Acute metabolic encephalopathy: Patient with recent admission when he was diagnosed with metastatic lung cancer, right frontal lobe lesion consistent with metastatic disease.  Patient is confused, oriented to name only, intermittently follows commands.  We will start him on Seroquel, obtain neuroimaging when he is capable of following commands.  Continue Decadron.  Currently undergoing radiation therapy, continue outpatient follow-up with oncology/pulmonology/Rad-oncology.  Consideration for palliative care.  PT/CM for discharge planning as he resides at home with his elderly wife.  DVT Prophylaxis SCDs    AM Labs Ordered, also please review Full Orders  Family Communication: Admission, patients condition and plan of care including tests being ordered have been discussed with the patient's wife who indicate understanding and agree with the plan and Code Status.  Code Status full  Likely DC to home with hospice/home health  Condition GUARDED    Admission status: Admit to inpatient  Time spent in minutes : 86   Peyton Bottoms M.D on 06/17/2019 at 3:39 AM  To page go to [email or web address] - password [facility]

## 2019-06-17 NOTE — Progress Notes (Signed)
The proposed treatment discussed in cancer conference 06/17/2019 is for discussion purpose only and not a binding recommendation.  The patient was not physically examined nor present for their treatment options.  Therefore, final treatment plans cannot be decided.

## 2019-06-17 NOTE — Progress Notes (Signed)
PROGRESS NOTE    Brian French.  ZOX:[id number] DOB: [birth year] DOA: 06/16/2019 PCP: [facility], [facility] [place]   Brief Narrative:  78 year old male with PAF, recently diag squamous cell ca of the right lower lobe  Currently undergoing radiation therapy, presents with worsening back pain.  On arrival to ED he was found to be in atrial fib with RVR and was started on IV cardizem gtt. CT angio of the chest,  abd and pelvis shows Right fourth and tenth rib fractures, likely pathologic and may account for the patient's right-sided chest pain. Extensive tumor involving the right hemithorax. Progressive metastatic disease involving the abdomen with enlarging and new hepatic metastatic lesions, enlarging left adrenal gland metastasis, enlarging bilateral renal metastasis and a new pancreatic tail metastasis. There also numerous new and enlarging mesenteric and omental implants in the abdomen/pelvis. Suspect enlarging bladder dome mass. Persistent ground-glass opacity in the right upper lobe. Oncology requested for further evaluation. Palliative care consulted for goals of care and symptom management.    Assessment & Plan:   Principal Problem:   Paroxysmal atrial fibrillation with rapid ventricular response (HCC) Active Problems:   Malignant pleural effusion   Frontal mass of brain   Vasogenic brain edema (HCC)   Malignant neoplasm of lung (HCC)   Acute metabolic encephalopathy   Atrial fibrillation with RVR: Rate better after starting the patient on IV cardizem gtt, transitioned to oral cardizem this morning.  Last echocardiogram showed Left ventricular ejection fraction, by visual estimation, is 50 to 55%. The left ventricle has normal function. There is mildly increased left ventricular hypertrophy. Left ventricular diastolic Doppler parameters are consistent with impaired relaxation pattern of LV diastolic filling. Wife reports his PCP discontinued the anti coagulation/ eliquis due to  brain mets.    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 but changed their mind and wanted to follow up at [facility].  Patient has received about 5 cycles of radiation treatment. Continue with radiation treatment.  Oncology team aware of his admission.   Hyponatremia  Probably from SIADH  From squamous cell ca of the lung.    Hyperkalemia:  Un clear etiology. Type 4 RTA.  ? Dehydration.  Ordered kayexalate and recheck potassium tonight. If no improvement with kayexalate, will order Lokelma.   Stage 3 b CKD:  Creatinine appears to be at baseline around 1.6.  Continue to monitor with IV fluids.   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  Started the patient on SSI and repeat A!c today.   BPH;  Continue with flomax.    Acute respiratory failure with hypoxia from the above.  Pt on 3 lit of [place] oxygen. Continue the same.    Pathological fractures of the right sided ribs.  Pain control.    Right frontal lobe metastatic lesion in the brain:  Currently on decadron   Dementia:  Continue with namenda daily.   In view of multiple medical problems, worsening metastatic disease, poor functional status, clinical deterioration, palliative care consulted for goals of care and symptom  Management.    DVT prophylaxis: scd's Code Status: full code. Poor prognosis.  Family Communication: discussed with wife over the phone  Disposition Plan: pending clinical improvement.    Consultants: palliative care  Oncology.    Procedures: radiation treatment.   Antimicrobials: None.  Subjective:  Confused, delirious.  Objective: Vitals:   06/17/19 0200 06/17/19 0300 06/17/19 0400 06/17/19 0500  BP: (!) 117/51 124/60 (!) 126/59 132/68  Pulse: 92 (!) 107 (!) 106     Resp: 14 16 19 19   Temp:   98.8 F (37.1 C)   TempSrc:   Oral   SpO2: 93% 94% 94%   Weight:    91.1 kg  Height:        Intake/Output Summary (Last 24 hours) at 06/17/2019 0920 Last data filed at 06/17/2019 0400 Gross per 24 hour  Intake 1841.23 ml  Output 1000 ml  Net 841.23 ml   Filed Weights   06/16/19 0426 06/17/19 0500  Weight: 92.1 kg 91.1 kg    Examination:  General exam: mild distress from pain, moaning.  Respiratory system: coarse breath sounds , diminished air entry on the right lower aspect of the lung.  Cardiovascular system: S1 & S2 heard, irregularly irregular.  Gastrointestinal system: Abdomen is nondistended, soft and mild gen tenderness.  Normal bowel sounds heard. Central nervous system: confused, able to move all extremities. Delirious.  Extremities: no pedal edema.  Skin: No rashes, lesions or ulcers Psychiatry: confused.     Data Reviewed: I have personally reviewed following labs and imaging studies  CBC: Recent Labs  Lab 06/16/19 0500  WBC 26.7*  NEUTROABS 19.6*  HGB 12.3*  HCT 38.1*  MCV 90.3  PLT 643   Basic Metabolic Panel: Recent Labs  Lab 06/16/19 0500 06/17/19 0220  NA 129* 130*  K 5.3* 5.6*  CL 94* 98  CO2 23 23  GLUCOSE 515* 306*  BUN 60* 55*  CREATININE 1.80* 1.67*  CALCIUM 8.7* 7.8*   GFR: Estimated Creatinine Clearance: 42.8 mL/min (A) (by C-G formula based on SCr of 1.67 mg/dL (H)). Liver Function Tests: Recent Labs  Lab 06/16/19 0500 06/17/19 0220  AST 13* 7*  ALT 15 14  ALKPHOS 128* 96  BILITOT 0.7 1.1  PROT 6.1* 4.9*  ALBUMIN 2.3* 1.9*   Recent Labs  Lab 06/16/19 0500  LIPASE 40   No results for input(s): AMMONIA in the last 168 hours. Coagulation Profile: No results for input(s): INR, PROTIME in the last 168 hours. Cardiac Enzymes: No results for input(s): CKTOTAL, CKMB, CKMBINDEX, TROPONINI in the last 168 hours. BNP (last 3 results) No results for input(s): PROBNP in the last 8760  hours. HbA1C: No results for input(s): HGBA1C in the last 72 hours. CBG: Recent Labs  Lab 06/16/19 0459 06/16/19 0722 06/16/19 1323 06/16/19 1748  GLUCAP 497* 411* 257* 259*   Lipid Profile: No results for input(s): CHOL, HDL, LDLCALC, TRIG, CHOLHDL, LDLDIRECT in the last 72 hours. Thyroid Function Tests: No results for input(s): TSH, T4TOTAL, FREET4, T3FREE, THYROIDAB in the last 72 hours. Anemia Panel: No results for input(s): VITAMINB12, FOLATE, FERRITIN, TIBC, IRON, RETICCTPCT in the last 72 hours. Sepsis Labs: Recent Labs  Lab 06/16/19 0500 06/16/19 0650  LATICACIDVEN 3.2* 2.6*    Recent Results (from the past 240 hour(s))  Blood culture (routine x 2)     Status: None (Preliminary result)   Collection Time: 06/16/19  5:00 AM   Specimen: BLOOD RIGHT ARM  Result Value Ref Range Status   Specimen Description   Final    BLOOD RIGHT ARM Performed at [facility], [address] [place], [place] [address]    Special Requests   Final    BOTTLES DRAWN AEROBIC AND ANAEROBIC Blood Culture adequate volume Performed at [facility], [address] [place], [place] [address]    Culture   Final    NO GROWTH < 24 HOURS Performed at [facility], [place] [address], [place], [place] [address]    Report Status PENDING  Incomplete  Blood culture (routine x 2)     Status: None (Preliminary result)   Collection Time: 06/16/19  5:30 AM   Specimen: BLOOD RIGHT HAND  Result Value Ref Range Status   Specimen Description   Final    BLOOD RIGHT HAND Performed at [facility], [address] [place], [place] [address]    Special Requests   Final    BOTTLES DRAWN AEROBIC AND ANAEROBIC Blood Culture results may not be optimal due to an inadequate volume of blood received in culture bottles Performed at [facility], [address] [place], [place] [address]    Culture   Final    NO GROWTH < 24 HOURS Performed at [facility], [place] [address] [place], [place] [address]    Report Status PENDING  Incomplete  SARS CORONAVIRUS 2 (TAT 6-24 HRS) Nasopharyngeal Nasopharyngeal Swab     Status: None   Collection Time: 06/16/19  5:35 AM   Specimen: Nasopharyngeal Swab  Result Value Ref Range Status   SARS Coronavirus 2 NEGATIVE NEGATIVE Final    Comment: (NOTE) SARS-CoV-2 target nucleic acids are NOT DETECTED. The SARS-CoV-2 RNA is generally detectable in upper and lower respiratory specimens during the acute phase of infection. Negative results do not preclude SARS-CoV-2 infection, do not rule out co-infections with other pathogens, and should not be used as the sole basis for treatment or other patient management decisions. Negative results must be combined with clinical observations, patient history, and epidemiological information. The expected result is Negative. Fact Sheet for Patients: SugarRoll.be Fact Sheet for Healthcare Providers: https://www.woods-mathews.com/ This test is not yet approved or cleared by the [place] FDA and  has been authorized for detection and/or diagnosis of SARS-CoV-2 by FDA under an Emergency Use Authorization (EUA). This EUA will remain  in effect (meaning this test can be used) for the duration of the COVID-19 declaration under Section 56 4(b)(1) of the Act, 21 U.S.C. section 360bbb-3(b)(1), unless the authorization is terminated or revoked sooner. Performed at [facility], [place] [address], [place], [place] [address]   Urine culture     Status: None   Collection Time: 06/16/19  5:55 AM   Specimen: Urine, Random  Result Value Ref Range Status   Specimen Description   Final    URINE, RANDOM Performed at [facility], [address] [place], [place] [address]    Special Requests   Final    NONE Performed at [facility], [address] [place], [place] [address]    Culture   Final    NO  GROWTH Performed at [facility], [place] [address] [place], [place] [address]    Report Status 06/17/2019 FINAL  Final         Radiology Studies: Ct Angio Chest Pe W And/or Wo Contrast  Result Date: 06/16/2019 CLINICAL DATA:  Right-sided chest pain. History of right lung cancer. EXAM: CT ANGIOGRAPHY CHEST CT ABDOMEN AND PELVIS WITH CONTRAST TECHNIQUE: Multidetector CT imaging of the chest was performed using the standard protocol during bolus administration of intravenous contrast. Multiplanar CT image reconstructions and MIPs were obtained to evaluate the vascular  anatomy. Multidetector CT imaging of the abdomen and pelvis was performed using the standard protocol during bolus administration of intravenous contrast. CONTRAST:  30mL OMNIPAQUE IOHEXOL 350 MG/ML SOLN COMPARISON:  CT chest 05/28/2019 and CT abdomen/pelvis 05/08/2019 FINDINGS: CTA CHEST FINDINGS Cardiovascular: The heart is within normal limits in size for age. No pericardial effusion. There is mild tortuosity, ectasia and calcification of the thoracic aorta. Three-vessel coronary artery calcifications are noted. The pulmonary arterial tree is fairly well opacified. No filling defects to suggest pulmonary embolism. Mediastinum/Nodes: Stable bulk E right mediastinal tumor in the subcarinal region surrounding the bronchus intermedius and extending into the right infrahilar region. This measures a maximum of 5.2 x 5.0 cm. Stable 9 mm paratracheal lymph node on image number 31/4. The esophagus is grossly normal. Lungs/Pleura: Persistent large right middle lobe lung mass invading the right infrahilar region. Associated complex/loculated right pleural fluid collection containing a PleurX drainage catheter. Ill-defined nodularity/infiltrating tumor in the epicardial fat. Persistent ground-glass opacity at the right lung apex measuring 2.8 x 2.7 cm. There is also a 15 mm nodule associated with the right major fissure which is likely loculated  fluid. The left lung is relatively clear. Musculoskeletal: Probable pathologic fracture involving the right fourth anterior rib which may account for the patient's chest pain. There may also be a pathologic fracture of the tenth rib laterally. Remote healed right tenth rib fracture. Review of the MIP images confirms the above findings. CT ABDOMEN and PELVIS FINDINGS Hepatobiliary: Progressive hepatic metastatic disease. 2.7 cm lesion in segment 2 previously measured 1.5 cm. 14 mm lesion in segment 8 appears to be new. There is also an ill-defined 2.5 cm lesion in the subdiaphragmatic fat anterior to the liver on image 22/11 New 10 mm lesion in segment 6. The gallbladder appears normal.  No common bile duct dilatation. Pancreas: There is a new ill-defined lesion in the pancreatic tail measuring 2 cm on image 32/11 which is likely a metastasis. Spleen: Normal size.  No focal lesions. Adrenals/Urinary Tract: Enlarging left adrenal gland metastasis measuring 2.5 cm and previously measuring 1.5 cm. Numerous ill-defined enlarging renal lesions most likely metastatic disease. Lower pole right renal lesion anteriorly measures 3.2 cm and previously measured 2.3 cm. 2.8 cm midpole left renal lesion previously measured 1.9 cm. Enlarging bladder mass at the dome. Stomach/Bowel: The stomach, duodenum, small bowel and colon are grossly normal. No acute inflammatory changes, mass lesions or obstructive findings. Vascular/Lymphatic: Advanced atherosclerotic calcifications involving the aorta and iliac arteries and branch vessels. Numerous small mesenteric and omental implants are noted throughout the abdomen. Index omental lesion on image 54/11 measures 19 mm. No retroperitoneal lymphadenopathy. Reproductive: The prostate gland and seminal vesicles are unremarkable. Other: No pelvic mass or pelvic lymphadenopathy. Pelvic implants are noted. Musculoskeletal: Do not see any obvious metastatic bone lesions. There may be a small lytic  lesion just above the right acetabulum. Review of the MIP images confirms the above findings. IMPRESSION: 1. No CT findings for pulmonary embolism. 2. No aortic aneurysm or dissection. 3. Right fourth and tenth rib fractures, likely pathologic and may account for the patient's right-sided chest pain. 4. Extensive tumor involving the right hemithorax as detailed above. Findings progressive since prior study. 5. Progressive metastatic disease involving the abdomen with enlarging and new hepatic metastatic lesions, enlarging left adrenal gland metastasis, enlarging bilateral renal metastasis and a new pancreatic tail metastasis. There also numerous new and enlarging mesenteric and omental implants in the abdomen/pelvis. 6. Suspect enlarging bladder dome mass. 7.  Persistent ground-glass opacity in the right upper lobe. Electronically Signed   By: Marijo Sanes M.D.   On: 06/16/2019 07:14   Ct Abdomen Pelvis W Contrast  Result Date: 06/16/2019 CLINICAL DATA:  Right-sided chest pain. History of right lung cancer. EXAM: CT ANGIOGRAPHY CHEST CT ABDOMEN AND PELVIS WITH CONTRAST TECHNIQUE: Multidetector CT imaging of the chest was performed using the standard protocol during bolus administration of intravenous contrast. Multiplanar CT image reconstructions and MIPs were obtained to evaluate the vascular anatomy. Multidetector CT imaging of the abdomen and pelvis was performed using the standard protocol during bolus administration of intravenous contrast. CONTRAST:  23mL OMNIPAQUE IOHEXOL 350 MG/ML SOLN COMPARISON:  CT chest 05/28/2019 and CT abdomen/pelvis 05/08/2019 FINDINGS: CTA CHEST FINDINGS Cardiovascular: The heart is within normal limits in size for age. No pericardial effusion. There is mild tortuosity, ectasia and calcification of the thoracic aorta. Three-vessel coronary artery calcifications are noted. The pulmonary arterial tree is fairly well opacified. No filling defects to suggest pulmonary embolism.  Mediastinum/Nodes: Stable bulk E right mediastinal tumor in the subcarinal region surrounding the bronchus intermedius and extending into the right infrahilar region. This measures a maximum of 5.2 x 5.0 cm. Stable 9 mm paratracheal lymph node on image number 31/4. The esophagus is grossly normal. Lungs/Pleura: Persistent large right middle lobe lung mass invading the right infrahilar region. Associated complex/loculated right pleural fluid collection containing a PleurX drainage catheter. Ill-defined nodularity/infiltrating tumor in the epicardial fat. Persistent ground-glass opacity at the right lung apex measuring 2.8 x 2.7 cm. There is also a 15 mm nodule associated with the right major fissure which is likely loculated fluid. The left lung is relatively clear. Musculoskeletal: Probable pathologic fracture involving the right fourth anterior rib which may account for the patient's chest pain. There may also be a pathologic fracture of the tenth rib laterally. Remote healed right tenth rib fracture. Review of the MIP images confirms the above findings. CT ABDOMEN and PELVIS FINDINGS Hepatobiliary: Progressive hepatic metastatic disease. 2.7 cm lesion in segment 2 previously measured 1.5 cm. 14 mm lesion in segment 8 appears to be new. There is also an ill-defined 2.5 cm lesion in the subdiaphragmatic fat anterior to the liver on image 22/11 New 10 mm lesion in segment 6. The gallbladder appears normal.  No common bile duct dilatation. Pancreas: There is a new ill-defined lesion in the pancreatic tail measuring 2 cm on image 32/11 which is likely a metastasis. Spleen: Normal size.  No focal lesions. Adrenals/Urinary Tract: Enlarging left adrenal gland metastasis measuring 2.5 cm and previously measuring 1.5 cm. Numerous ill-defined enlarging renal lesions most likely metastatic disease. Lower pole right renal lesion anteriorly measures 3.2 cm and previously measured 2.3 cm. 2.8 cm midpole left renal lesion  previously measured 1.9 cm. Enlarging bladder mass at the dome. Stomach/Bowel: The stomach, duodenum, small bowel and colon are grossly normal. No acute inflammatory changes, mass lesions or obstructive findings. Vascular/Lymphatic: Advanced atherosclerotic calcifications involving the aorta and iliac arteries and branch vessels. Numerous small mesenteric and omental implants are noted throughout the abdomen. Index omental lesion on image 54/11 measures 19 mm. No retroperitoneal lymphadenopathy. Reproductive: The prostate gland and seminal vesicles are unremarkable. Other: No pelvic mass or pelvic lymphadenopathy. Pelvic implants are noted. Musculoskeletal: Do not see any obvious metastatic bone lesions. There may be a small lytic lesion just above the right acetabulum. Review of the MIP images confirms the above findings. IMPRESSION: 1. No CT findings for pulmonary embolism. 2. No aortic  aneurysm or dissection. 3. Right fourth and tenth rib fractures, likely pathologic and may account for the patient's right-sided chest pain. 4. Extensive tumor involving the right hemithorax as detailed above. Findings progressive since prior study. 5. Progressive metastatic disease involving the abdomen with enlarging and new hepatic metastatic lesions, enlarging left adrenal gland metastasis, enlarging bilateral renal metastasis and a new pancreatic tail metastasis. There also numerous new and enlarging mesenteric and omental implants in the abdomen/pelvis. 6. Suspect enlarging bladder dome mass. 7. Persistent ground-glass opacity in the right upper lobe. Electronically Signed   By: Marijo Sanes M.D.   On: 06/16/2019 07:14   Dg Chest Portable 1 View  Result Date: 06/16/2019 CLINICAL DATA:  Chest pain.  History of lung cancer. EXAM: PORTABLE CHEST 1 VIEW COMPARISON:  06/01/2019 FINDINGS: Right-sided PleurX drainage type catheter in place. The cardiac silhouette, mediastinal and hilar contours are within normal limits and  stable. Persistent right basilar density, likely a combination of tumor and atelectasis but no large recurrent effusion. The left lung remains clear. IMPRESSION: 1. Stable PleurX drainage catheter without recurrent effusion. 2. Stable right medial basilar density. Electronically Signed   By: Marijo Sanes M.D.   On: 06/16/2019 05:37        Scheduled Meds:  aspirin EC  81 mg Oral Daily   Chlorhexidine Gluconate Cloth  6 each Topical Daily   dexamethasone  4 mg Oral Daily   diltiazem  60 mg Oral Q6H   docusate sodium  100 mg Oral Daily   memantine  10 mg Oral QHS   pantoprazole  40 mg Oral Daily   QUEtiapine  50 mg Oral QHS   tamsulosin  0.4 mg Oral Daily   Continuous Infusions:  sodium chloride 75 mL/hr at 06/17/19 0400   diltiazem (CARDIZEM) infusion 10 mg/hr (06/17/19 0400)     LOS: 1 day        Hosie Poisson, MD [facility] 06/17/2019, 9:20 AM

## 2019-06-17 NOTE — Progress Notes (Signed)
Brief Oncology Note:  Requested for PD-L1 to be sent on prior lung biopsy.   Mikey Bussing, DNP, AGPCNP-BC, AOCNP

## 2019-06-18 ENCOUNTER — Ambulatory Visit
Admission: RE | Admit: 2019-06-18 | Discharge: 2019-06-18 | Disposition: A | Discharge status: Home / Self Care | Payer: Medicare Other | Source: Ambulatory Visit | Attending: Radiation Oncology | Admitting: Radiation Oncology

## 2019-06-18 ENCOUNTER — Encounter: Payer: Self-pay | Admitting: Radiation Oncology

## 2019-06-18 ENCOUNTER — Other Ambulatory Visit: Payer: Self-pay | Admitting: Radiation Therapy

## 2019-06-18 DIAGNOSIS — I4891 Unspecified atrial fibrillation: Secondary | ICD-10-CM

## 2019-06-18 DIAGNOSIS — G9389 Other specified disorders of brain: Secondary | ICD-10-CM

## 2019-06-18 DIAGNOSIS — E875 Hyperkalemia: Secondary | ICD-10-CM

## 2019-06-18 DIAGNOSIS — Z7189 Other specified counseling: Secondary | ICD-10-CM

## 2019-06-18 DIAGNOSIS — R739 Hyperglycemia, unspecified: Secondary | ICD-10-CM

## 2019-06-18 LAB — CBC WITH DIFFERENTIAL/PLATELET
Abs Immature Granulocytes: 1.2 10*3/uL — ABNORMAL HIGH (ref 0.00–0.07)
Basophils Absolute: 0 10*3/uL (ref 0.0–0.1)
Basophils Relative: 0 %
Eosinophils Absolute: 0.5 10*3/uL (ref 0.0–0.5)
Eosinophils Relative: 3 %
HCT: 30.7 % — ABNORMAL LOW (ref 39.0–52.0)
Hemoglobin: 9.9 g/dL — ABNORMAL LOW (ref 13.0–17.0)
Immature Granulocytes: 6 %
Lymphocytes Relative: 5 %
Lymphs Abs: 1 10*3/uL (ref 0.7–4.0)
MCH: 29 pg (ref 26.0–34.0)
MCHC: 32.2 g/dL (ref 30.0–36.0)
MCV: 90 fL (ref 80.0–100.0)
Monocytes Absolute: 1.5 10*3/uL — ABNORMAL HIGH (ref 0.1–1.0)
Monocytes Relative: 8 %
Neutro Abs: 16 10*3/uL — ABNORMAL HIGH (ref 1.7–7.7)
Neutrophils Relative %: 78 %
Platelets: 135 10*3/uL — ABNORMAL LOW (ref 150–400)
RBC: 3.41 MIL/uL — ABNORMAL LOW (ref 4.22–5.81)
RDW: 15.7 % — ABNORMAL HIGH (ref 11.5–15.5)
WBC: 20.3 10*3/uL — ABNORMAL HIGH (ref 4.0–10.5)
nRBC: 0 % (ref 0.0–0.2)

## 2019-06-18 LAB — BASIC METABOLIC PANEL
Anion gap: 14 (ref 5–15)
Anion gap: 12 (ref 5–15)
BUN: 59 mg/dL — ABNORMAL HIGH (ref 8–23)
BUN: 50 mg/dL — ABNORMAL HIGH (ref 8–23)
CO2: 21 mmol/L — ABNORMAL LOW (ref 22–32)
CO2: 20 mmol/L — ABNORMAL LOW (ref 22–32)
Calcium: 7.9 mg/dL — ABNORMAL LOW (ref 8.9–10.3)
Calcium: 7.8 mg/dL — ABNORMAL LOW (ref 8.9–10.3)
Chloride: 101 mmol/L (ref 98–111)
Chloride: 105 mmol/L (ref 98–111)
Creatinine, Ser: 1.57 mg/dL — ABNORMAL HIGH (ref 0.61–1.24)
Creatinine, Ser: 1.42 mg/dL — ABNORMAL HIGH (ref 0.61–1.24)
GFR calc Af Amer: 49 mL/min — ABNORMAL LOW (ref >60)
GFR calc Af Amer: 55 mL/min — ABNORMAL LOW (ref >60)
GFR calc non Af Amer: 42 mL/min — ABNORMAL LOW (ref >60)
GFR calc non Af Amer: 47 mL/min — ABNORMAL LOW (ref >60)
Glucose, Bld: 211 mg/dL — ABNORMAL HIGH (ref 70–99)
Glucose, Bld: 246 mg/dL — ABNORMAL HIGH (ref 70–99)
Potassium: 4.2 mmol/L (ref 3.5–5.1)
Potassium: 4.3 mmol/L (ref 3.5–5.1)
Sodium: 136 mmol/L (ref 135–145)
Sodium: 137 mmol/L (ref 135–145)

## 2019-06-18 LAB — GLUCOSE, CAPILLARY
Glucose-Capillary: 539 mg/dL (ref 70–99)
Glucose-Capillary: 422 mg/dL — ABNORMAL HIGH (ref 70–99)
Glucose-Capillary: 253 mg/dL — ABNORMAL HIGH (ref 70–99)
Glucose-Capillary: 197 mg/dL — ABNORMAL HIGH (ref 70–99)
Glucose-Capillary: 194 mg/dL — ABNORMAL HIGH (ref 70–99)
Glucose-Capillary: 194 mg/dL — ABNORMAL HIGH (ref 70–99)

## 2019-06-18 MED ORDER — LORAZEPAM 2 MG/ML IJ SOLN
0.5000 mg | INTRAMUSCULAR | Status: DC | PRN
  Administered 2019-06-18: 0.5 mg via INTRAVENOUS
  Administered 2019-06-19: 1 mg via INTRAVENOUS
  Filled 2019-06-18 (×2): qty 1

## 2019-06-18 MED ORDER — INSULIN ASPART 100 UNIT/ML ~~LOC~~ SOLN
15.0000 [IU] | Freq: Once | SUBCUTANEOUS | Status: AC
  Administered 2019-06-18: 15 [IU] via SUBCUTANEOUS

## 2019-06-18 MED ORDER — HYDROMORPHONE HCL 2 MG PO TABS
2.0000 mg | ORAL_TABLET | ORAL | Status: DC | PRN
  Administered 2019-06-18 – 2019-06-19 (×3): 2 mg via ORAL
  Filled 2019-06-18 (×3): qty 1

## 2019-06-18 MED ORDER — HALOPERIDOL LACTATE 5 MG/ML IJ SOLN
5.0000 mg | Freq: Once | INTRAMUSCULAR | Status: AC | PRN
  Administered 2019-06-18: 5 mg via INTRAVENOUS
  Filled 2019-06-18: qty 1

## 2019-06-18 MED ORDER — HYDROMORPHONE HCL 1 MG/ML IJ SOLN
0.5000 mg | INTRAMUSCULAR | Status: DC | PRN
  Administered 2019-06-18: 0.5 mg via INTRAVENOUS
  Filled 2019-06-18: qty 1

## 2019-06-18 MED ORDER — LORAZEPAM 2 MG/ML IJ SOLN
1.0000 mg | Freq: Once | INTRAMUSCULAR | Status: AC
  Administered 2019-06-18: 1 mg via INTRAVENOUS
  Filled 2019-06-18: qty 1

## 2019-06-18 NOTE — Progress Notes (Signed)
Pt has become increasingly more confused and agitated as the night progresses. The RN has made attempts to redirect and reorient Pt which is useful in the short term, but the Pt quickly becomes agitated shortly after. In one instance, the RN entered the Pt's room to find that he had removed all equipment used to monitor vital signs and had removed his gown. He was also yelling. When the RN asked the Pt to cover up he grabbed his penis and told her "I want you to look at it" as he shook it at her. Pt contacted [facility] who ordered a 1x dose of 5 mg Haldol. This did not prove to be effective for calming patient down.

## 2019-06-18 NOTE — Progress Notes (Signed)
Inpatient Diabetes Program Recommendations  AACE/ADA: New Consensus Statement on Inpatient Glycemic Control (2015)  Target Ranges:  Prepandial:   less than 140 mg/dL      Peak postprandial:   less than 180 mg/dL (1-2 hours)      Critically ill patients:  140 - 180 mg/dL   Lab Results  Component Value Date   GLUCAP 194 (H) 06/18/2019   HGBA1C 10.5 (H) 06/17/2019    Review of Glycemic Control  Diabetes history: DM2 Outpatient Diabetes medications: Novolog 20 units "prn" for high blood sugars, Actos 45 mg QD Current orders for Inpatient glycemic control: Novolog 0-15 units tidwc and 0-5 units QHS + 3 units tidwc  HgbA1C 10.5%   Inpatient Diabetes Program Recommendations:     If FBS > 180 mg/dL, add Lantus 10 units QD, if appropriate.  Follow.  Thank you. Lorenda Peck, RD, LDN, CDE Inpatient Diabetes Coordinator [phone]

## 2019-06-18 NOTE — TOC Initial Note (Signed)
Transition of Care (TOC) - Initial/Assessment Note    Patient Details  Name: Brian French. MRN: [id number] Date of Birth: 07/26/1940  Transition of Care [facility]) CM/SW Contact:    Leighanne Adolph, Marjie Skiff, RN Phone Number: 06/18/2019, 3:31 PM  Clinical Narrative:                 Was active with [facility] prior to admission. TOC will continue to follow. Pt lives with spouse.  Expected Discharge Plan: [place] Barriers to Discharge: Continued Medical Work up Expected Discharge Plan and Services Expected Discharge Plan: [place]       Prior Living Arrangements/Services   Lives with:: Spouse      Admission diagnosis:  Hyperkalemia [E87.5] Hyperglycemia [R73.9] AKI (acute kidney injury) ([place]) [N17.9] Atrial fibrillation with RVR ([place]) [I48.91] A-fib ([place]) [I48.91] Patient Active Problem List   Diagnosis Date Noted  . Acute metabolic encephalopathy 56/81/2751  . A-fib ([place]) 06/16/2019  . Paroxysmal atrial fibrillation with rapid ventricular response ([place]) 06/16/2019  . Malignant neoplasm of lung ([place])   . Frontal mass of brain 05/29/2019  . Vasogenic brain edema ([place]) 05/29/2019  . Hilar mass 05/28/2019  . Hyponatremia 05/28/2019  . Metabolic acidosis, normal anion gap (NAG) 05/28/2019  . AF (paroxysmal atrial fibrillation) ([place]) 05/28/2019  . BPH (benign prostatic hyperplasia) 05/28/2019  . AAA (abdominal aortic aneurysm) ([place]) 05/28/2019  . Pneumonia due to infectious agent 05/27/2019  . Malignant pleural effusion 05/27/2019  . AKI (acute kidney injury) ([place]) 05/27/2019  . Hyperkalemia 05/27/2019  . Atrial fibrillation with RVR ([place]) 04/30/2019  . Acute pyelonephritis 04/30/2019  . Parapneumonic effusion 04/30/2019  . Type 2 diabetes mellitus with hyperlipidemia ([place]) 04/30/2019  . Community acquired pneumonia of right upper lobe of lung 04/29/2019  . Vertigo 05/22/2015  . Headache disorder 05/22/2015   PCP:  [facility]:   [facility] [phone] [address], [address] AT [facility] [address] [place] [address] Phone: [phone] Fax: [place], [place] - [place] [phone] [place] [address] Phone: [phone] Fax: [phone]  [facility] [place], [place] - [address] [place] [address] Phone: [phone] Fax: [phone]  [id number] - [place], [address] [place] [address] Phone: [phone] Fax: [phone]     Social Determinants of Health ([place]) Interventions    Readmission Risk Interventions Readmission Risk Prevention Plan 06/18/2019 06/01/2019  Transportation Screening Complete Complete  Medication Review Press photographer) Complete Complete  PCP or Specialist appointment within 3-5 days of discharge Complete (No Data)  [place] or Home Care Consult Complete -  SW Recovery Care/Counseling Consult Complete -  Palliative Care Screening Not Applicable -  [place] Not Applicable -  Some recent data might be hidden

## 2019-06-18 NOTE — Consult Note (Signed)
Consultation Note Date: 06/18/2019   Patient Name: Brian French.  DOB: [birth year]  MRN: [id number]  Age / Sex: 78 y.o., male  PCP: [facility] Referring Physician: Hosie Poisson, MD  Reason for Consultation: Establishing goals of care  HPI/Patient Profile: 78 y.o. 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 fib with RVR.  His hospital course has been complicated by severe delirium with behavioral disturbances.  Palliative consulted for goals of care.  Clinical Assessment and Goals of Care: I saw and examined Brian French today.  He remains confused and cannot participate in goals of care conversation.  I met today with his wife and daughter, Lenna Sciara, at the bedside.  I introduced palliative care as specialized medical care for people living with serious illness. It focuses on providing relief from the symptoms and stress of a serious illness. The goal is to improve quality of life for both the patient and the family.  His wife reports that his family, particularly his grandchildren, the most important things to him.  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  His favorite hobby was playing scratch off tickets.  We discussed clinical course this admission as well as his diagnosis and prognosis of recently discovered squamous cell cancer with lung primary.  Family has not yet made it to appointment with oncology and so they have not been able to really discuss severity of his disease with significant metastasis to multiple organs including the liver, brain, pancreas, kidneys, and omentum.  His wife states she is not surprised to hear that he cannot be cured from this but states this is the first time she was given this information.   Currently, he is on course of radiation with 10 scheduled treatments.  He is also scheduled to see Dr. Maylon Peppers as an outpatient next week and there are stains pending from his biopsy to assess for potential immunotherapy.  His wife states that she is confused on what to do as she does not think that he is strong enough nor would he want overly aggressive interventions if his disease cannot be cured, however, she also wants to add as much time in quality to his life if there are interventions that may be of benefit for the quality of his life.  We discussed difference between a aggressive medical intervention path and a palliative, comfort focused care path.    We discussed that in light of multiple chronic medical problems that have worsened with this acute problem, care should be focused on interventions that are likely to allow the patient to achieve goal of getting back to home and spending time with family. I discussed with family regarding heroic interventions at the end-of-life and they agree this would not be in line with prior expressed wishes for a natural death or be likely to lead to getting well enough to go  back home. They were in agreement with changing CODE STATUS to DO NOT RESUSCITATE.  Concept of Hospice and Palliative Care were discussed  Questions and concerns addressed.   PMT will continue to support holistically.  SUMMARY OF RECOMMENDATIONS   - DNR/DNI -Family would like to discuss options moving forward.  At this point in time, they appear to be leaning towards follow-up with Dr. Maylon Peppers to discuss findings of biopsy and potential treatments.  His wife is clear that she would only want to pursue interventions if therapy are going to add time in quality to his life since this is not a curable illness.  Code Status/Advance Care Planning:  DNR   Symptom Management:   Pain: He is sleepy following IV Dilaudid.  We will plan for trial of lower dose of oral Dilaudid to see if this is  something that will control his pain while allowing him to be more alert for transition home.  Delirium: Continue Seroquel   Prognosis:   Unable to determine as will depend upon decisions regarding disease modifying therapy moving forward.  If he decides to forego further disease modifying therapy, his prognosis would be less than 6 months if his disease follows its natural course and he should qualify for hospice benefits if so desired.  Discharge Planning: Home with Palliative Services most likely.  Family also may consider transition home with hospice.     Primary Diagnoses: Present on Admission: . Paroxysmal atrial fibrillation with rapid ventricular response ([place]) . Malignant pleural effusion . Malignant neoplasm of lung ([place]) . Vasogenic brain edema ([place]) . Acute metabolic encephalopathy   I have reviewed the medical record, interviewed the patient and family, and examined the patient. The following aspects are pertinent.  Past Medical History:  Diagnosis Date  . Atrial fibrillation ([place])   . Cancer (HCC)    Squamous Cell Carcinoma  . Dementia ([place])   . Diabetes mellitus without complication ([place])   . Headache disorder 05/22/2015  . Hearing loss    bilateral - hearing aids  . Hyperlipidemia   . Hypertension    Social History   Socioeconomic History  . Marital status: Married    Spouse name: Gerald Stabs  . Number of children: 0  . Years of education: Grad  . Highest education level: Not on file  Occupational History  . Not on file  Social Needs  . Financial resource strain: Not on file  . Food insecurity    Worry: Not on file    Inability: Not on file  . Transportation needs    Medical: Not on file    Non-medical: Not on file  Tobacco Use  . Smoking status: Former Smoker    Packs/day: 2.00    Types: Cigarettes    Quit date: 07/15/1997    Years since quitting: 21.9  . Smokeless tobacco: Never Used  Substance and Sexual Activity  . Alcohol use: Yes     Alcohol/week: 0.0 standard drinks    Comment: occasionally  . Drug use: Never  . Sexual activity: Not on file  Lifestyle  . Physical activity    Days per week: Not on file    Minutes per session: Not on file  . Stress: Not on file  Relationships  . Social Herbalist on phone: Not on file    Gets together: Not on file    Attends religious service: Not on file    Active member of club or organization: Not on file  Attends meetings of clubs or organizations: Not on file    Relationship status: Not on file  Other Topics Concern  . Not on file  Social History Narrative   Patient lives at home with his spouse.   Caffeine Use:    Family History  Problem Relation Age of Onset  . Cancer Mother   . Heart disease Father    Scheduled Meds: . aspirin EC  81 mg Oral Daily  . Chlorhexidine Gluconate Cloth  6 each Topical Daily  . dexamethasone  4 mg Oral Daily  . diltiazem  60 mg Oral Q6H  . docusate sodium  100 mg Oral Daily  . feeding supplement (ENSURE ENLIVE)  237 mL Oral TID BM  . insulin aspart  0-15 Units Subcutaneous TID WC  . insulin aspart  0-5 Units Subcutaneous QHS  . insulin aspart  3 Units Subcutaneous TID WC  . memantine  10 mg Oral QHS  . multivitamin with minerals  1 tablet Oral Daily  . pantoprazole  40 mg Oral Daily  . QUEtiapine  50 mg Oral QHS  . tamsulosin  0.4 mg Oral Daily   Continuous Infusions: . sodium chloride 75 mL/hr at 06/18/19 2005   PRN Meds:.acetaminophen **OR** acetaminophen, bisacodyl, HYDROmorphone, LORazepam, ondansetron **OR** ondansetron (ZOFRAN) IV Medications Prior to Admission:  Prior to Admission medications   Medication Sig Start Date End Date Taking? Authorizing Provider  acetaminophen (TYLENOL) 500 MG tablet Take 1,000 mg by mouth every 6 (six) hours as needed for mild pain or headache.   Yes [provider]  aspirin EC 81 MG tablet Take 81 mg by mouth daily.   Yes [provider]  dexamethasone  (DECADRON) 4 MG tablet Take 1 tablet (4 mg total) by mouth daily. 06/04/19 07/04/19 Yes Donne Hazel, MD  HYDROcodone-acetaminophen (NORCO/VICODIN) 5-325 MG tablet Take 1-2 tablets every 6 hours as needed for severe pain 05/08/19  Yes Carlisle Cater, PA-C  insulin aspart (NOVOLOG) 100 UNIT/ML injection Inject 20 Units into the skin as needed for high blood sugar.    Yes [provider]  lidocaine (LIDODERM) 5 % Place 1 patch onto the skin daily. Remove & Discard patch within 12 hours or as directed by MD 04/25/19  Yes Gareth Morgan, MD  memantine (NAMENDA) 10 MG tablet Take 10 mg by mouth at bedtime.   Yes [provider]  Multiple Vitamin (MULTIVITAMIN WITH MINERALS) TABS tablet Take 1 tablet by mouth daily.   Yes [provider]  Omega-3 Fatty Acids (FISH OIL) 1000 MG CAPS Take 2,000 mg by mouth 2 (two) times daily.   Yes [provider]  omeprazole (PRILOSEC) 20 MG capsule Take 20 mg by mouth daily.   Yes [provider]  ondansetron (ZOFRAN) 8 MG tablet Take 4 mg by mouth 3 (three) times daily as needed for nausea or vomiting (30 minutes before meals).   Yes [provider]  pioglitazone (ACTOS) 45 MG tablet Take 45 mg by mouth daily.   Yes [provider]  promethazine (PHENERGAN) 12.5 MG tablet Take 1 tablet (12.5 mg total) by mouth 3 (three) times daily. 06/07/19  Yes Julian Hy, DO  senna (SENOKOT) 8.6 MG tablet Take 1 tablet by mouth daily.   Yes [provider]  tamsulosin (FLOMAX) 0.4 MG CAPS capsule Take 0.4 mg by mouth daily.   Yes [provider]  diltiazem (CARDIZEM CD) 120 MG 24 hr capsule Take 1 capsule (120 mg total) by mouth daily. 05/03/19  05/02/20  Cherylann Ratel A, DO   No Known Allergies Review of Systems Unable to obtain  Physical Exam  General: Sleepy, in no acute distress.  HEENT: No bruits, no goiter, no JVD Heart: Irregular. No murmur appreciated. Lungs: Decreased air movement,  coarse Abdomen: Soft, nontender, nondistended, positive bowel sounds.  Ext: No significant edema Skin: Warm and dry  Vital Signs: BP (!) 134/45 (BP Location: Right Arm)   Pulse (!) 117   Temp 98 F (36.7 C) (Oral)   Resp (!) 23   Ht 5' 11"  (1.803 m)   Wt 92.6 kg   SpO2 94%   BMI 28.47 kg/m  Pain Scale: 0-10   Pain Score: Asleep   SpO2: SpO2: 94 % O2 Device:SpO2: 94 % O2 Flow Rate: .O2 Flow Rate (L/min): 2 L/min  IO: Intake/output summary:   Intake/Output Summary (Last 24 hours) at 06/18/2019 2125 Last data filed at 06/18/2019 1800 Gross per 24 hour  Intake 1361.84 ml  Output 1650 ml  Net -288.16 ml    LBM: Last BM Date: 06/18/19 Baseline Weight: Weight: 92.1 kg Most recent weight: Weight: 92.6 kg     Palliative Assessment/Data:     Time In: 1110 Time Out: 1240 Time Total: 90 Greater than 50%  of this time was spent counseling and coordinating care related to the above assessment and plan.  Signed by: Micheline Rough, MD   Please contact Palliative Medicine Team phone at [phone] for questions and concerns.  For individual provider: See Shea Evans

## 2019-06-18 NOTE — Progress Notes (Signed)
PROGRESS NOTE    Brian French.  OJJ:[id number] DOB: Apr 17, 1941 DOA: 06/16/2019 PCP: [facility]   Brief Narrative:  78 year old male with PAF, recently diag squamous cell ca of the right lower lobe  Currently undergoing radiation therapy, presents with worsening back pain.  On arrival to ED he was found to be in atrial fib with RVR and was started on IV cardizem gtt. CT angio of the chest,  abd and pelvis shows Right fourth and tenth rib fractures, likely pathologic and may account for the patient's right-sided chest pain. Extensive tumor involving the right hemithorax. Progressive metastatic disease involving the abdomen with enlarging and new hepatic metastatic lesions, enlarging left adrenal gland metastasis, enlarging bilateral renal metastasis and a new pancreatic tail metastasis. There also numerous new and enlarging mesenteric and omental implants in the abdomen/pelvis. Suspect enlarging bladder dome mass. Persistent ground-glass opacity in the right upper lobe. Oncology requested for further evaluation. Palliative care consulted for goals of care and symptom management.  Overnight pt became agitated and delirious requiring the use of haldol and ativan.  palliative care meeting today scheduled.    Assessment & Plan:   Principal Problem:   Paroxysmal atrial fibrillation with rapid ventricular response (HCC) Active Problems:   Malignant pleural effusion   Frontal mass of brain   Vasogenic brain edema (HCC)   Malignant neoplasm of lung (HCC)   Acute metabolic encephalopathy   Atrial fibrillation with RVR: Rate better after starting the patient on IV cardizem gtt, transitioned to oral cardizem. Continue with oral cardizem if able to take by mouth.  Last echocardiogram showed Left ventricular ejection fraction, by visual estimation, is 50 to 55%. The left ventricle has normal function. There is mildly increased left ventricular hypertrophy. Left ventricular  diastolic Doppler parameters are consistent with impaired relaxation pattern of LV diastolic filling. Wife reports his PCP discontinued the anti coagulation/ eliquis due to brain mets.     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 but changed their mind and wanted to follow up at [facility].  Patient has received about 6 cycles of radiation treatment. Continue with radiation treatment.  Discussed with Dr Lorette Ang team, no plans to do inpatient treatment at this time and outpatient follow up with oncology recommended.   Hyponatremia  Probably from SIADH  From squamous cell ca of the lung vs dehydration.  His sodium improved to 136 with hydration.    Hyperkalemia:  Un clear etiology. Type 4 RTA.  ? Dehydration.  Resolved with Lokelma and IV fluids.   Stage 3 b CKD:  Creatinine appears to be at baseline around 1.6.  Continue to monitor with IV fluids.   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  CBG (last 3)  Recent Labs    06/18/19 0203 06/18/19 0423 06/18/19 1132  GLUCAP 422* 253* 194*   Started on SSI and novolog 3 units TIDAC.  a1C IS 10.5.   BPH;  Continue with flomax.    Acute respiratory failure with hypoxia from the above.  Pt on 3 lit of [place] oxygen. Continue the same.    Pathological fractures of the right sided ribs.  Pain control.    Right frontal lobe metastatic lesion in the brain:  Currently on decadron   Dementia:  Continue with namenda daily.   In  view of multiple medical problems, worsening metastatic disease, poor functional status, clinical deterioration, palliative care consulted for goals of care and symptom  Management.  PALLIATIVE meeting scheduled today.    Anemia of chronic disease:  Baseline Hemoglobin between 10 to 11.    Currently at 9.9 without any signs of bleeding.  Transfuse to keep hemoglobin greater than 7.    Mild thrombocytopenia:  Moitor.    Leukocytosis;  No signs of infection. Probably from IV decadron.    DVT prophylaxis: scd's Code Status: DNR, Poor prognosis.  Family Communication: discussed with wife and daughter over the phone  Disposition Plan: pending clinical improvement.    Consultants: palliative care  Oncology.    Procedures: radiation treatment.   Antimicrobials: None.   Subjective: Confused and delirious.  Objective: Vitals:   06/18/19 0400 06/18/19 0500 06/18/19 0600 06/18/19 0800  BP: 136/60 (!) 141/70 (!) 128/57 125/60  Pulse: (!) 102 (!) 104 84 (!) 113  Resp: 17 14 14  (!) 27  Temp: 97.7 F (36.5 C)   98.4 F (36.9 C)  TempSrc: Oral   Axillary  SpO2: 92% 92% 93% 91%  Weight:  92.6 kg    Height:        Intake/Output Summary (Last 24 hours) at 06/18/2019 0852 Last data filed at 06/18/2019 0800 Gross per 24 hour  Intake 1826.37 ml  Output 2075 ml  Net -248.63 ml   Filed Weights   06/16/19 0426 06/17/19 0500 06/18/19 0500  Weight: 92.1 kg 91.1 kg 92.6 kg    Examination:  General exam: LETHARGIC, ill appearing gentleman, moaning.  Respiratory system: coarse breath sounds, air entry fair bilateral, pleurex catheter on the right.  Cardiovascular system: S1 & S2 heard, irregularly irregular. Tachycardic.  Gastrointestinal system: Abdomen is soft, non distended, bowel sounds wnl.  Central nervous system: confused, delirious, able to move all extremities.  Extremities: no pedal edema.  Skin: no rashes seen.  Psychiatry: confused.     Data Reviewed: I have personally reviewed following labs and imaging studies  CBC: Recent Labs  Lab 06/16/19 0500 06/18/19 0513  WBC 26.7* 20.3*  NEUTROABS 19.6* 16.0*  HGB 12.3* 9.9*  HCT 38.1* 30.7*  MCV 90.3 90.0  PLT 167 536*   Basic Metabolic Panel: Recent Labs  Lab 06/16/19 0500 06/17/19 0220  06/17/19 1651 06/17/19 2139 06/18/19 0513  NA 129* 130* 130* 130* 136  K 5.3* 5.6* 5.8* 5.1 4.2  CL 94* 98 98 99 101  CO2 23 23 19* 16* 21*  GLUCOSE 515* 306* 511* 599* 211*  BUN 60* 55* 54* 60* 59*  CREATININE 1.80* 1.67* 1.66* 1.72* 1.57*  CALCIUM 8.7* 7.8* 7.6* 7.7* 7.9*   GFR: Estimated Creatinine Clearance: 45.8 mL/min (A) (by C-G formula based on SCr of 1.57 mg/dL (H)). Liver Function Tests: Recent Labs  Lab 06/16/19 0500 06/17/19 0220  AST 13* 7*  ALT 15 14  ALKPHOS 128* 96  BILITOT 0.7 1.1  PROT 6.1* 4.9*  ALBUMIN 2.3* 1.9*   Recent Labs  Lab 06/16/19 0500  LIPASE 40   No results for input(s): AMMONIA in the last 168 hours. Coagulation Profile: No results for input(s): INR, PROTIME in the last 168 hours. Cardiac Enzymes: No results for input(s): CKTOTAL, CKMB, CKMBINDEX, TROPONINI in the last 168 hours. BNP (last 3 results) No results for input(s): PROBNP in the last 8760 hours. HbA1C: Recent Labs    06/17/19 1651  HGBA1C 10.5*   CBG: Recent Labs  Lab 06/16/19 1748 06/17/19 1734 06/17/19  2229 06/18/19 0203 06/18/19 0423  GLUCAP 259* 497* 539* 422* 253*   Lipid Profile: No results for input(s): CHOL, HDL, LDLCALC, TRIG, CHOLHDL, LDLDIRECT in the last 72 hours. Thyroid Function Tests: No results for input(s): TSH, T4TOTAL, FREET4, T3FREE, THYROIDAB in the last 72 hours. Anemia Panel: No results for input(s): VITAMINB12, FOLATE, FERRITIN, TIBC, IRON, RETICCTPCT in the last 72 hours. Sepsis Labs: Recent Labs  Lab 06/16/19 0500 06/16/19 0650 06/17/19 1651  LATICACIDVEN 3.2* 2.6* 1.5    Recent Results (from the past 240 hour(s))  Blood culture (routine x 2)     Status: None (Preliminary result)   Collection Time: 06/16/19  5:00 AM   Specimen: BLOOD RIGHT ARM  Result Value Ref Range Status   Specimen Description   Final    BLOOD RIGHT ARM Performed at [facility], [address] [place], [place] [address]    Special Requests    Final    BOTTLES DRAWN AEROBIC AND ANAEROBIC Blood Culture adequate volume Performed at [facility], [address] [place], [place] [address]    Culture   Final    NO GROWTH 2 DAYS Performed at [facility], [place] [address], [place], [place] [address]    Report Status PENDING  Incomplete  Blood culture (routine x 2)     Status: None (Preliminary result)   Collection Time: 06/16/19  5:30 AM   Specimen: BLOOD RIGHT HAND  Result Value Ref Range Status   Specimen Description   Final    BLOOD RIGHT HAND Performed at [facility], [address] [place], [place] [address]    Special Requests   Final    BOTTLES DRAWN AEROBIC AND ANAEROBIC Blood Culture results may not be optimal due to an inadequate volume of blood received in culture bottles Performed at [facility], [address] [place], [place] [address]    Culture   Final    NO GROWTH 2 DAYS Performed at [facility], [place] [address], [place], [place] [address]    Report Status PENDING  Incomplete  SARS CORONAVIRUS 2 (TAT 6-24 HRS) Nasopharyngeal Nasopharyngeal Swab     Status: None   Collection Time: 06/16/19  5:35 AM   Specimen: Nasopharyngeal Swab  Result Value Ref Range Status   SARS Coronavirus 2 NEGATIVE NEGATIVE Final    Comment: (NOTE) SARS-CoV-2 target nucleic acids are NOT DETECTED. The SARS-CoV-2 RNA is generally detectable in upper and lower respiratory specimens during the acute phase of infection. Negative results do not preclude SARS-CoV-2 infection, do not rule out co-infections with other pathogens, and should not be used as the sole basis for treatment or other patient management decisions. Negative results must be combined with clinical observations, patient history, and epidemiological information. The expected result is Negative. Fact Sheet for Patients: SugarRoll.be Fact Sheet for Healthcare  Providers: https://www.woods-mathews.com/ This test is not yet approved or cleared by the [place] FDA and  has been authorized for detection and/or diagnosis of SARS-CoV-2 by FDA under an Emergency Use Authorization (EUA). This EUA will remain  in effect (meaning this test can be used) for the duration of the COVID-19 declaration under Section 56 4(b)(1) of the Act, 21 U.S.C. section 360bbb-3(b)(1), unless the authorization is terminated or revoked sooner. Performed at [facility], [place] [address] [place], [place] [address]   Urine culture     Status: None   Collection Time: 06/16/19  5:55 AM   Specimen: Urine, Random  Result Value Ref Range Status   Specimen Description   Final    URINE, RANDOM Performed at [facility], [address] [place], [place] [address]    Special Requests   Final    NONE Performed at [facility], [address] [place], [place] [address]    Culture   Final    NO GROWTH Performed at [facility], [place] [address], [place],  [address]    Report Status 06/17/2019 FINAL  Final         Radiology Studies: No results found.      Scheduled Meds:  aspirin EC  81 mg Oral Daily   Chlorhexidine Gluconate Cloth  6 each Topical Daily   dexamethasone  4 mg Oral Daily   diltiazem  60 mg Oral Q6H   docusate sodium  100 mg Oral Daily   feeding supplement (ENSURE ENLIVE)  237 mL Oral TID BM   insulin aspart  0-15 Units Subcutaneous TID WC   insulin aspart  0-5 Units Subcutaneous QHS   insulin aspart  3 Units Subcutaneous TID WC   memantine  10 mg Oral QHS   multivitamin with minerals  1 tablet Oral Daily   pantoprazole  40 mg Oral Daily   QUEtiapine  50 mg Oral QHS   tamsulosin  0.4 mg Oral Daily   Continuous Infusions:  sodium chloride 75 mL/hr at 06/18/19 0800     LOS: 2 days        Hosie Poisson, MD [facility] 06/18/2019, 8:52 AM

## 2019-06-19 LAB — CBC WITH DIFFERENTIAL/PLATELET
Abs Immature Granulocytes: 1.39 10*3/uL — ABNORMAL HIGH (ref 0.00–0.07)
Basophils Absolute: 0.1 10*3/uL (ref 0.0–0.1)
Basophils Relative: 0 %
Eosinophils Absolute: 2.7 10*3/uL — ABNORMAL HIGH (ref 0.0–0.5)
Eosinophils Relative: 13 %
HCT: 32.8 % — ABNORMAL LOW (ref 39.0–52.0)
Hemoglobin: 10.3 g/dL — ABNORMAL LOW (ref 13.0–17.0)
Immature Granulocytes: 7 %
Lymphocytes Relative: 4 %
Lymphs Abs: 0.9 10*3/uL (ref 0.7–4.0)
MCH: 29 pg (ref 26.0–34.0)
MCHC: 31.4 g/dL (ref 30.0–36.0)
MCV: 92.4 fL (ref 80.0–100.0)
Monocytes Absolute: 1.6 10*3/uL — ABNORMAL HIGH (ref 0.1–1.0)
Monocytes Relative: 8 %
Neutro Abs: 14.3 10*3/uL — ABNORMAL HIGH (ref 1.7–7.7)
Neutrophils Relative %: 68 %
Platelets: 132 10*3/uL — ABNORMAL LOW (ref 150–400)
RBC: 3.55 MIL/uL — ABNORMAL LOW (ref 4.22–5.81)
RDW: 16.2 % — ABNORMAL HIGH (ref 11.5–15.5)
WBC: 21 10*3/uL — ABNORMAL HIGH (ref 4.0–10.5)
nRBC: 0 % (ref 0.0–0.2)

## 2019-06-19 LAB — GLUCOSE, CAPILLARY
Glucose-Capillary: 198 mg/dL — ABNORMAL HIGH (ref 70–99)
Glucose-Capillary: 366 mg/dL — ABNORMAL HIGH (ref 70–99)
Glucose-Capillary: 203 mg/dL — ABNORMAL HIGH (ref 70–99)
Glucose-Capillary: 203 mg/dL — ABNORMAL HIGH (ref 70–99)

## 2019-06-19 LAB — BASIC METABOLIC PANEL
Anion gap: 14 (ref 5–15)
Anion gap: 11 (ref 5–15)
BUN: 52 mg/dL — ABNORMAL HIGH (ref 8–23)
BUN: 53 mg/dL — ABNORMAL HIGH (ref 8–23)
CO2: 20 mmol/L — ABNORMAL LOW (ref 22–32)
CO2: 21 mmol/L — ABNORMAL LOW (ref 22–32)
Calcium: 7.8 mg/dL — ABNORMAL LOW (ref 8.9–10.3)
Calcium: 7.7 mg/dL — ABNORMAL LOW (ref 8.9–10.3)
Chloride: 104 mmol/L (ref 98–111)
Chloride: 106 mmol/L (ref 98–111)
Creatinine, Ser: 1.53 mg/dL — ABNORMAL HIGH (ref 0.61–1.24)
Creatinine, Ser: 1.69 mg/dL — ABNORMAL HIGH (ref 0.61–1.24)
GFR calc Af Amer: 50 mL/min — ABNORMAL LOW (ref >60)
GFR calc Af Amer: 44 mL/min — ABNORMAL LOW (ref >60)
GFR calc non Af Amer: 43 mL/min — ABNORMAL LOW (ref >60)
GFR calc non Af Amer: 38 mL/min — ABNORMAL LOW (ref >60)
Glucose, Bld: 332 mg/dL — ABNORMAL HIGH (ref 70–99)
Glucose, Bld: 212 mg/dL — ABNORMAL HIGH (ref 70–99)
Potassium: 4.7 mmol/L (ref 3.5–5.1)
Potassium: 5 mmol/L (ref 3.5–5.1)
Sodium: 138 mmol/L (ref 135–145)
Sodium: 138 mmol/L (ref 135–145)

## 2019-06-19 MED ORDER — HYDROMORPHONE HCL 2 MG PO TABS
2.0000 mg | ORAL_TABLET | ORAL | Status: DC | PRN
  Administered 2019-06-19 (×2): 2 mg via ORAL
  Filled 2019-06-19 (×5): qty 1

## 2019-06-19 NOTE — TOC Progression Note (Signed)
Transition of Care (TOC) - Progression Note    Patient Details  Name: Brian French. MRN: [id number] Date of Birth: 1940-09-24  Transition of Care [facility]) CM/SW Contact  Joaquin Courts, RN Phone Number: 06/19/2019, 1:34 PM  Clinical Narrative:    CM received call from palliative MD stating patient and spouse interested in home hospice services. [facility].  Referral given to [facility] for home hospice services.     Expected Discharge Plan: [place] Barriers to Discharge: Continued Medical Work up  Expected Discharge Plan and Services Expected Discharge Plan: [place] Choice: Hospice                                         Social Determinants of Health (SDOH) Interventions    Readmission Risk Interventions Readmission Risk Prevention Plan 06/18/2019 06/01/2019  Transportation Screening Complete Complete  Medication Review Press photographer) Complete Complete  PCP or Specialist appointment within 3-5 days of discharge Complete (No Data)  [place] or Home Care Consult Complete -  SW Recovery Care/Counseling Consult Complete -  Palliative Care Screening Not Applicable -  [place] Not Applicable -  Some recent data might be hidden

## 2019-06-19 NOTE — Progress Notes (Signed)
Daily Progress Note   Patient Name: Brian French.       Date: 06/19/2019 DOB: 12-01-1940  Age: 78 y.o. MRN#: [id number] Attending Physician: Hosie Poisson, MD Primary Care Physician: [facility] Admit Date: 06/16/2019  Reason for Consultation/Follow-up: Establishing goals of care  Subjective: I saw and examined Brian French today.  He remains sleepy today.  I discussed with his wife and she reports that she has been discussing with family and really their goal is to 1) work to get him home and 2) be as comfortable as possible.  She does want to pursue further radiation or systemic therapy.  Discussed hospice services and how they would be the best support with these goals in mind.  She states that she would like to discuss with home hospice agency.  Notes that she lives near [place] and would like to discuss with [facility] [place].  Length of Stay: 3  Current Medications: Scheduled Meds:  . aspirin EC  81 mg Oral Daily  . Chlorhexidine Gluconate Cloth  6 each Topical Daily  . dexamethasone  4 mg Oral Daily  . diltiazem  60 mg Oral Q6H  . docusate sodium  100 mg Oral Daily  . feeding supplement (ENSURE ENLIVE)  237 mL Oral TID BM  . insulin aspart  0-15 Units Subcutaneous TID WC  . insulin aspart  0-5 Units Subcutaneous QHS  . memantine  10 mg Oral QHS  . multivitamin with minerals  1 tablet Oral Daily  . pantoprazole  40 mg Oral Daily  . QUEtiapine  50 mg Oral QHS  . tamsulosin  0.4 mg Oral Daily    Continuous Infusions: . sodium chloride 75 mL/hr at 06/19/19 1900    PRN Meds: acetaminophen **OR** acetaminophen, bisacodyl, HYDROmorphone, LORazepam, ondansetron **OR** ondansetron (ZOFRAN) IV  Physical Exam    General: Sleepy, in no acute distress.   HEENT: No bruits, no goiter, no JVD Heart: Irregular. No murmur appreciated. Lungs: Decreased air movement, coarse Abdomen: Soft, nontender, nondistended, positive bowel sounds.  Ext: No significant edema Skin: Warm and dry       Vital Signs: BP 109/76 (BP Location: Right Arm)   Pulse 100   Temp (!) 97.5 F (36.4 C) (Oral)   Resp 16   Ht 5\' 11"  (1.803 m)   Wt  92.8 kg   SpO2 93%   BMI 28.53 kg/m  SpO2: SpO2: 93 % O2 Device: O2 Device: Room Air O2 Flow Rate: O2 Flow Rate (L/min): 1 L/min  Intake/output summary:   Intake/Output Summary (Last 24 hours) at 06/19/2019 2028 Last data filed at 06/19/2019 1900 Gross per 24 hour  Intake 1867.92 ml  Output 1425 ml  Net 442.92 ml   LBM: Last BM Date: 06/19/19 Baseline Weight: Weight: 92.1 kg Most recent weight: Weight: 92.8 kg       Palliative Assessment/Data:      Patient Active Problem List   Diagnosis Date Noted  . Acute metabolic encephalopathy 10/62/6948  . A-fib ([place]) 06/16/2019  . Paroxysmal atrial fibrillation with rapid ventricular response ([place]) 06/16/2019  . Malignant neoplasm of lung ([place])   . Frontal mass of brain 05/29/2019  . Vasogenic brain edema ([place]) 05/29/2019  . Hilar mass 05/28/2019  . Hyponatremia 05/28/2019  . Metabolic acidosis, normal anion gap (NAG) 05/28/2019  . AF (paroxysmal atrial fibrillation) ([place]) 05/28/2019  . BPH (benign prostatic hyperplasia) 05/28/2019  . AAA (abdominal aortic aneurysm) ([place]) 05/28/2019  . Pneumonia due to infectious agent 05/27/2019  . Malignant pleural effusion 05/27/2019  . AKI (acute kidney injury) ([place]) 05/27/2019  . Hyperkalemia 05/27/2019  . Atrial fibrillation with RVR ([place]) 04/30/2019  . Acute pyelonephritis 04/30/2019  . Parapneumonic effusion 04/30/2019  . Type 2 diabetes mellitus with hyperlipidemia ([place]) 04/30/2019  . Community acquired pneumonia of right upper lobe of lung 04/29/2019  . Vertigo 05/22/2015  . Headache disorder 05/22/2015     Palliative Care Assessment & Plan   Patient Profile: 78 y.o. 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 fib with RVR.  His hospital course has been complicated by severe delirium with behavioral disturbances.  Palliative consulted for goals of care.  Assessment: Patient Active Problem List   Diagnosis Date Noted  . Acute metabolic encephalopathy 54/62/7035  . A-fib ([place]) 06/16/2019  . Paroxysmal atrial fibrillation with rapid ventricular response ([place]) 06/16/2019  . Malignant neoplasm of lung ([place])   . Frontal mass of brain 05/29/2019  . Vasogenic brain edema ([place]) 05/29/2019  . Hilar mass 05/28/2019  . Hyponatremia 05/28/2019  . Metabolic acidosis, normal anion gap (NAG) 05/28/2019  . AF (paroxysmal atrial fibrillation) ([place]) 05/28/2019  . BPH (benign prostatic hyperplasia) 05/28/2019  . AAA (abdominal aortic aneurysm) ([place]) 05/28/2019  . Pneumonia due to infectious agent 05/27/2019  . Malignant pleural effusion 05/27/2019  . AKI (acute kidney injury) ([place]) 05/27/2019  . Hyperkalemia 05/27/2019  . Atrial fibrillation with RVR ([place]) 04/30/2019  . Acute pyelonephritis 04/30/2019  . Parapneumonic effusion 04/30/2019  . Type 2 diabetes mellitus with hyperlipidemia ([place]) 04/30/2019  . Community acquired pneumonia of right upper lobe of lung 04/29/2019  . Vertigo 05/22/2015  . Headache disorder 05/22/2015   Recommendations/Plan:  Family would like to work to transition home with hospice support.  They are in agreement with forgoing further disease modifying therapy in favor of working to get him home with focus on comfort.  Wife would like to discuss with [facility] [place].  Discussed with CM who will make referral.  Goals of Care and Additional Recommendations:  Limitations on Scope of Treatment: Avoid Hospitalization and  Full Comfort Care  Code Status:    Code Status Orders  (From admission, onward)  Start     Ordered   06/18/19 1241  Do not attempt resuscitation (DNR)  Continuous    Question Answer Comment  In the event of cardiac or respiratory ARREST Do not call a "code blue"   In the event of cardiac or respiratory ARREST Do not perform Intubation, CPR, defibrillation or ACLS   In the event of cardiac or respiratory ARREST Use medication by any route, position, wound care, and other measures to relive pain and suffering. May use oxygen, suction and manual treatment of airway obstruction as needed for comfort.      06/18/19 1240        Code Status History    Date Active Date Inactive Code Status Order ID Comments User Context   06/16/2019 2044 06/18/2019 1240 Full Code [id number]  Peyton Bottoms, MD Inpatient   06/16/2019 0931 06/16/2019 2044 Full Code [id number]  Georgette Shell, MD ED   05/27/2019 1430 06/04/2019 1645 Full Code [id number]  Barb Merino, MD ED   04/30/2019 0137 05/03/2019 1612 Full Code [id number]  Etta Quill, DO Inpatient   Advance Care Planning Activity    Advance Directive Documentation     Most Recent Value  Type of Advance Directive  Healthcare Power of Attorney  Pre-existing out of facility DNR order (yellow form or pink MOST form)  -  "MOST" Form in Place?  -       Prognosis:   < 6 months  Discharge Planning:  Home with Hospice  Care plan was discussed with wife, Dr. Karleen [place]  Thank you for allowing the Palliative Medicine Team to assist in the care of this patient.   Total Time 40 Prolonged Time Billed No      Greater than 50%  of this time was spent counseling and coordinating care related to the above assessment and plan.  Micheline Rough, MD  Please contact Palliative Medicine Team phone at [phone] for questions and concerns.

## 2019-06-19 NOTE — Progress Notes (Signed)
PROGRESS NOTE    Brian French.  QIH:[id number] DOB: 1940-09-26 DOA: 06/16/2019 PCP: [facility]   Brief Narrative:  78 year old male with PAF, recently diag squamous cell ca of the right lower lobe  Currently undergoing radiation therapy, presents with worsening back pain.  On arrival to ED he was found to be in atrial fib with RVR and was started on IV cardizem gtt. CT angio of the chest,  abd and pelvis shows Right fourth and tenth rib fractures, likely pathologic and may account for the patient's right-sided chest pain. Extensive tumor involving the right hemithorax. Progressive metastatic disease involving the abdomen with enlarging and new hepatic metastatic lesions, enlarging left adrenal gland metastasis, enlarging bilateral renal metastasis and a new pancreatic tail metastasis. There also numerous new and enlarging mesenteric and omental implants in the abdomen/pelvis. Suspect enlarging bladder dome mass. Persistent ground-glass opacity in the right upper lobe. Oncology requested for further evaluation. Palliative care consulted for goals of care and symptom management.   After discussion with palliative team and the patient's family/spouse and daughter they were interested to pursue home hospice services as as they feel that patient is not getting better.   Assessment & Plan:   Principal Problem:   Paroxysmal atrial fibrillation with rapid ventricular response (HCC) Active Problems:   Malignant pleural effusion   Frontal mass of brain   Vasogenic brain edema (HCC)   Malignant neoplasm of lung (HCC)   Acute metabolic encephalopathy   Atrial fibrillation with RVR: Rate better after starting the patient on IV cardizem gtt, transitioned to oral cardizem. Continue with oral cardizem if able to take by mouth.  Last echocardiogram showed Left ventricular ejection fraction, by visual estimation, is 50 to 55%. The left ventricle has normal function. There is mildly  increased left ventricular hypertrophy. Left ventricular diastolic Doppler parameters are consistent with impaired relaxation pattern of LV diastolic filling. Wife reports his PCP discontinued the anti coagulation/ eliquis due to brain mets.  Patient rate controlled with rates between 80s to 90s on oral Cardizem.    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  Patient has received about 6 cycles of radiation treatment. Continue with radiation treatment.  Discussed with Dr Lorette Ang team, no plans to do inpatient treatment at this time and outpatient follow up with oncology recommended.  Patient spouse and daughter would like to pursue hospice services on discharge at this time. Transfer patient to [place].  Hyponatremia  Probably from SIADH  From squamous cell ca of the lung vs dehydration.    Sodium at 138 this morning   Hyperkalemia:  Un clear etiology. Type 4 RTA.  ? Dehydration.  Resolved with Lokelma and IV fluids  Stage 3 b CKD:  Creatinine appears to be at baseline around 1.6.  Creatinine stable at 1.6 today.  Continue to monitor.  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  CBG (last 3)  Recent Labs    06/18/19 1132 06/18/19 1627 06/18/19 2129  GLUCAP 194* 194* 198*   Continue with sliding scale insulin at this time a1C IS 10.5.   BPH;  Continue with Flomax   Acute respiratory failure with hypoxia from the above.  Patient on 3 L of nasal cannula oxygen and stable.  Pathological fractures of the right sided ribs.  Optimal pain control, increase Dilaudid to 2 mg every 2 hours as needed.   Right frontal lobe metastatic lesion in the brain:  Resume Decadron  Dementia:  Continue with  Namenda  In view of multiple medical problems, worsening metastatic disease, poor functional status, clinical deterioration, palliative care consulted for goals of care and symptom appreciate palliative care input and recommendations.   Anemia of chronic disease:  Baseline Hemoglobin between 10 to 11.  Hemoglobin stable around 10 Transfuse to keep hemoglobin greater than 7.    Mild thrombocytopenia:  Monitor, no signs of bleeding   Leukocytosis;  No signs of infection.  Probably from Decadron   DVT prophylaxis: scd's Code Status: DNR, Poor prognosis.  Family Communication: discussed with wife and daughter over the phone  Disposition Plan: Transfer to floor and home with home hospice when arrangements are done   Consultants: palliative care  Oncology.    Procedures: radiation treatment.   Antimicrobials: None.   Subjective: Patient appears better today with adequate pain control Objective: Vitals:   06/19/19 0500 06/19/19 0506 06/19/19 0600 06/19/19 0800  BP: 111/86  112/67 (!) 141/63  Pulse: (!) 109 (!) 115 (!) 109 (!) 125  Resp:   18 (!) 29  Temp:  98.9 F (37.2 C)  97.7 F (36.5 C)  TempSrc:  Axillary  Oral  SpO2: 95% 97% 96% 92%  Weight: 92.8 kg     Height:        Intake/Output Summary (Last 24 hours) at 06/19/2019 0854 Last data filed at 06/19/2019 0800 Gross per 24 hour  Intake 1656.55 ml  Output 1325 ml  Net 331.55 ml   Filed Weights   06/17/19 0500 06/18/19 0500 06/19/19 0500  Weight: 91.1 kg 92.6 kg 92.8 kg    Examination:  General exam: Ill-appearing, well developed gentleman in mild distress Respiratory system: Coarse breath sounds bilaterally Pleurx catheter in place on the right Cardiovascular system: S1-S2 heard, irregular, no JVD, no pedal edema Gastrointestinal system: Abdomen is soft, nondistended, bowel sounds within normal limits, tender in the right upper quadrant area Central nervous system: Patient is confused but was able to tell  me where his pain is, able to move all his extremities Extremities: No pedal edema Skin: No rashes seen Psychiatry: Confused    Data Reviewed: I have personally reviewed following labs and imaging studies  CBC: Recent Labs  Lab 06/16/19 0500 06/18/19 0513 06/19/19 0549  WBC 26.7* 20.3* 21.0*  NEUTROABS 19.6* 16.0* 14.3*  HGB 12.3* 9.9* 10.3*  HCT 38.1* 30.7* 32.8*  MCV 90.3 90.0 92.4  PLT 167 135* 161*   Basic Metabolic Panel: Recent Labs  Lab 06/17/19 1651 06/17/19 2139 06/18/19 0513 06/18/19 1723 06/19/19 0549  NA 130* 130* 136 137 138  K 5.8* 5.1 4.2 4.3 4.7  CL 98 99 101 105 104  CO2 19* 16* 21* 20* 20*  GLUCOSE 511* 599* 211* 246* 332*  BUN 54* 60* 59* 50* 52*  CREATININE 1.66* 1.72* 1.57* 1.42* 1.53*  CALCIUM 7.6* 7.7* 7.9* 7.8* 7.8*   GFR: Estimated Creatinine Clearance: 47.1 mL/min (A) (by C-G formula based on SCr of 1.53 mg/dL (H)). Liver Function Tests: Recent Labs  Lab 06/16/19 0500 06/17/19 0220  AST 13* 7*  ALT 15 14  ALKPHOS 128* 96  BILITOT 0.7 1.1  PROT 6.1* 4.9*  ALBUMIN 2.3* 1.9*   Recent Labs  Lab 06/16/19 0500  LIPASE 40   No results for input(s): AMMONIA  in the last 168 hours. Coagulation Profile: No results for input(s): INR, PROTIME in the last 168 hours. Cardiac Enzymes: No results for input(s): CKTOTAL, CKMB, CKMBINDEX, TROPONINI in the last 168 hours. BNP (last 3 results) No results for input(s): PROBNP in the last 8760 hours. HbA1C: Recent Labs    06/17/19 1651  HGBA1C 10.5*   CBG: Recent Labs  Lab 06/18/19 0423 06/18/19 0744 06/18/19 1132 06/18/19 1627 06/18/19 2129  GLUCAP 253* 197* 194* 194* 198*   Lipid Profile: No results for input(s): CHOL, HDL, LDLCALC, TRIG, CHOLHDL, LDLDIRECT in the last 72 hours. Thyroid Function Tests: No results for input(s): TSH, T4TOTAL, FREET4, T3FREE, THYROIDAB in the last 72 hours. Anemia Panel: No results for input(s): VITAMINB12, FOLATE, FERRITIN, TIBC, IRON, RETICCTPCT  in the last 72 hours. Sepsis Labs: Recent Labs  Lab 06/16/19 0500 06/16/19 0650 06/17/19 1651  LATICACIDVEN 3.2* 2.6* 1.5    Recent Results (from the past 240 hour(s))  Blood culture (routine x 2)     Status: None (Preliminary result)   Collection Time: 06/16/19  5:00 AM   Specimen: BLOOD RIGHT ARM  Result Value Ref Range Status   Specimen Description   Final    BLOOD RIGHT ARM Performed at [facility], [address] [place], [place] [address]    Special Requests   Final    BOTTLES DRAWN AEROBIC AND ANAEROBIC Blood Culture adequate volume Performed at [facility], [address] [place], [place] [address]    Culture   Final    NO GROWTH 2 DAYS Performed at [facility], [place] [address] [place], [place] [address]    Report Status PENDING  Incomplete  Blood culture (routine x 2)     Status: None (Preliminary result)   Collection Time: 06/16/19  5:30 AM   Specimen: BLOOD RIGHT HAND  Result Value Ref Range Status   Specimen Description   Final    BLOOD RIGHT HAND Performed at [facility], [address] [place], [place] [address]    Special Requests   Final    BOTTLES DRAWN AEROBIC AND ANAEROBIC Blood Culture results may not be optimal due to an inadequate volume of blood received in culture bottles Performed at [facility], [address] [place], [place] [address]    Culture   Final    NO GROWTH 2 DAYS Performed at [facility], [place] [address], [place], [place] [address]    Report Status PENDING  Incomplete  SARS CORONAVIRUS 2 (TAT 6-24 HRS) Nasopharyngeal Nasopharyngeal Swab     Status: None   Collection Time: 06/16/19  5:35 AM   Specimen: Nasopharyngeal Swab  Result Value Ref Range Status   SARS Coronavirus 2 NEGATIVE NEGATIVE Final    Comment: (NOTE) SARS-CoV-2 target nucleic acids are NOT DETECTED. The SARS-CoV-2 RNA is generally detectable in upper and lower respiratory specimens during the acute  phase of infection. Negative results do not preclude SARS-CoV-2 infection, do not rule out co-infections with other pathogens, and should not be used as the sole basis for treatment or other patient management decisions. Negative results must be combined with clinical observations, patient history, and epidemiological information. The expected result is Negative. Fact Sheet for Patients: SugarRoll.be Fact Sheet for Healthcare Providers: https://www.woods-mathews.com/ This test is not yet approved or cleared by the [place] FDA and  has been authorized for detection and/or diagnosis of SARS-CoV-2 by FDA under an Emergency  Use Authorization (EUA). This EUA will remain  in effect (meaning this test can be used) for the duration of the COVID-19 declaration under Section 56 4(b)(1) of the Act, 21 U.S.C. section 360bbb-3(b)(1), unless the authorization is terminated or revoked sooner. Performed at [facility], [place] [address] [place], [place] [address]   Urine culture     Status: None   Collection Time: 06/16/19  5:55 AM   Specimen: Urine, Random  Result Value Ref Range Status   Specimen Description   Final    URINE, RANDOM Performed at [facility], [address] [place], [place] [address]    Special Requests   Final    NONE Performed at [facility], [address] [place], [place] [address]    Culture   Final    NO GROWTH Performed at [facility], [place] [address], [place], [place] [address]    Report Status 06/17/2019 FINAL  Final         Radiology Studies: No results found.      Scheduled Meds: . aspirin EC  81 mg Oral Daily  . Chlorhexidine Gluconate Cloth  6 each Topical Daily  . dexamethasone  4 mg Oral Daily  . diltiazem  60 mg Oral Q6H  . docusate sodium  100 mg Oral Daily  . feeding supplement (ENSURE ENLIVE)  237 mL Oral TID BM  . insulin aspart  0-15 Units Subcutaneous  TID WC  . insulin aspart  0-5 Units Subcutaneous QHS  . insulin aspart  3 Units Subcutaneous TID WC  . memantine  10 mg Oral QHS  . multivitamin with minerals  1 tablet Oral Daily  . pantoprazole  40 mg Oral Daily  . QUEtiapine  50 mg Oral QHS  . tamsulosin  0.4 mg Oral Daily   Continuous Infusions: . sodium chloride 75 mL/hr at 06/19/19 0800     LOS: 3 days        Hosie Poisson, MD [facility] 06/19/2019, 8:54 AM

## 2019-06-20 DIAGNOSIS — Z515 Encounter for palliative care: Secondary | ICD-10-CM

## 2019-06-20 LAB — CBC WITH DIFFERENTIAL/PLATELET
Abs Immature Granulocytes: 0.73 10*3/uL — ABNORMAL HIGH (ref 0.00–0.07)
Basophils Absolute: 0.1 10*3/uL (ref 0.0–0.1)
Basophils Relative: 0 %
Eosinophils Absolute: 0.6 10*3/uL — ABNORMAL HIGH (ref 0.0–0.5)
Eosinophils Relative: 3 %
HCT: 32.4 % — ABNORMAL LOW (ref 39.0–52.0)
Hemoglobin: 10.3 g/dL — ABNORMAL LOW (ref 13.0–17.0)
Immature Granulocytes: 4 %
Lymphocytes Relative: 5 %
Lymphs Abs: 1 10*3/uL (ref 0.7–4.0)
MCH: 29.2 pg (ref 26.0–34.0)
MCHC: 31.8 g/dL (ref 30.0–36.0)
MCV: 91.8 fL (ref 80.0–100.0)
Monocytes Absolute: 1.6 10*3/uL — ABNORMAL HIGH (ref 0.1–1.0)
Monocytes Relative: 9 %
Neutro Abs: 14.2 10*3/uL — ABNORMAL HIGH (ref 1.7–7.7)
Neutrophils Relative %: 79 %
Platelets: 142 10*3/uL — ABNORMAL LOW (ref 150–400)
RBC: 3.53 MIL/uL — ABNORMAL LOW (ref 4.22–5.81)
RDW: 16.3 % — ABNORMAL HIGH (ref 11.5–15.5)
WBC: 18.1 10*3/uL — ABNORMAL HIGH (ref 4.0–10.5)
nRBC: 0 % (ref 0.0–0.2)

## 2019-06-20 LAB — GLUCOSE, CAPILLARY
Glucose-Capillary: 301 mg/dL — ABNORMAL HIGH (ref 70–99)
Glucose-Capillary: 370 mg/dL — ABNORMAL HIGH (ref 70–99)
Glucose-Capillary: 237 mg/dL — ABNORMAL HIGH (ref 70–99)
Glucose-Capillary: 237 mg/dL — ABNORMAL HIGH (ref 70–99)

## 2019-06-20 LAB — BASIC METABOLIC PANEL
Anion gap: 16 — ABNORMAL HIGH (ref 5–15)
BUN: 50 mg/dL — ABNORMAL HIGH (ref 8–23)
CO2: 20 mmol/L — ABNORMAL LOW (ref 22–32)
Calcium: 7.9 mg/dL — ABNORMAL LOW (ref 8.9–10.3)
Chloride: 105 mmol/L (ref 98–111)
Creatinine, Ser: 1.36 mg/dL — ABNORMAL HIGH (ref 0.61–1.24)
GFR calc Af Amer: 58 mL/min — ABNORMAL LOW (ref >60)
GFR calc non Af Amer: 50 mL/min — ABNORMAL LOW (ref >60)
Glucose, Bld: 336 mg/dL — ABNORMAL HIGH (ref 70–99)
Potassium: 4.8 mmol/L (ref 3.5–5.1)
Sodium: 141 mmol/L (ref 135–145)

## 2019-06-20 MED ORDER — LORAZEPAM 1 MG PO TABS: 1.0000 mg | ORAL_TABLET | Freq: Four times a day (QID) | ORAL | 0 refills | Status: DC | PRN

## 2019-06-20 MED ORDER — QUETIAPINE FUMARATE 50 MG PO TABS: 50.0000 mg | ORAL_TABLET | Freq: Every day | ORAL | 0 refills | Status: DC

## 2019-06-20 MED ORDER — DOCUSATE SODIUM 100 MG PO CAPS: 100.0000 mg | ORAL_CAPSULE | Freq: Every day | ORAL | 0 refills | Status: AC

## 2019-06-20 MED ORDER — HYDROMORPHONE HCL 2 MG PO TABS: 2.0000 mg | ORAL_TABLET | Freq: Four times a day (QID) | ORAL | 0 refills | Status: DC | PRN

## 2019-06-20 MED ORDER — ENSURE ENLIVE PO LIQD: 237.0000 mL | Freq: Three times a day (TID) | ORAL | 0 refills | Status: AC

## 2019-06-20 MED ORDER — LORAZEPAM 1 MG PO TABS
1.0000 mg | ORAL_TABLET | Freq: Four times a day (QID) | ORAL | Status: DC | PRN
  Administered 2019-06-20 – 2019-06-21 (×3): 1 mg via ORAL
  Filled 2019-06-20 (×3): qty 1

## 2019-06-20 MED ORDER — HYDROMORPHONE HCL 2 MG PO TABS
2.0000 mg | ORAL_TABLET | ORAL | Status: DC | PRN
  Administered 2019-06-20 – 2019-06-21 (×3): 2 mg via ORAL

## 2019-06-20 NOTE — Progress Notes (Signed)
Daily Progress Note   Patient Name: Brian French.       Date: 06/20/2019 DOB: 1941-01-16  Age: 78 y.o. MRN#: [id number] Attending Physician: Hosie Poisson, MD Primary Care Physician: [facility] Admit Date: 06/16/2019  Reason for Consultation/Follow-up: Establishing goals of care  Subjective: I saw and examined Brian French today.  He is confused but redirectable today.  I discussed with his wife at bedside.  Plan is for transition home with hospice support.  We reviewed plans for symptom management at discharge.  Length of Stay: 4  Current Medications: Scheduled Meds:  . aspirin EC  81 mg Oral Daily  . Chlorhexidine Gluconate Cloth  6 each Topical Daily  . dexamethasone  4 mg Oral Daily  . diltiazem  60 mg Oral Q6H  . docusate sodium  100 mg Oral Daily  . feeding supplement (ENSURE ENLIVE)  237 mL Oral TID BM  . insulin aspart  0-15 Units Subcutaneous TID WC  . insulin aspart  0-5 Units Subcutaneous QHS  . memantine  10 mg Oral QHS  . multivitamin with minerals  1 tablet Oral Daily  . pantoprazole  40 mg Oral Daily  . QUEtiapine  50 mg Oral QHS  . tamsulosin  0.4 mg Oral Daily    Continuous Infusions:   PRN Meds: acetaminophen **OR** acetaminophen, bisacodyl, HYDROmorphone, HYDROmorphone, LORazepam, LORazepam, ondansetron **OR** ondansetron (ZOFRAN) IV  Physical Exam    General: Sleepy, confused.  Placed hands in stool that he just had.  HEENT: No bruits, no goiter, no JVD Heart: Irregular. No murmur appreciated. Lungs: Decreased air movement, coarse Abdomen: Soft, nontender, nondistended, positive bowel sounds.  Ext: No significant edema Skin: Warm and dry       Vital Signs: BP 123/68 (BP Location: Left Arm)   Pulse 83   Temp (!) 97.3 F (36.3  C) (Oral)   Resp 14   Ht 5\' 11"  (1.803 m)   Wt 92.8 kg   SpO2 96%   BMI 28.53 kg/m  SpO2: SpO2: 96 % O2 Device: O2 Device: Room Air O2 Flow Rate: O2 Flow Rate (L/min): 1 L/min  Intake/output summary:   Intake/Output Summary (Last 24 hours) at 06/20/2019 1259 Last data filed at 06/20/2019 1009 Gross per 24 hour  Intake 1432.6 ml  Output 1925 ml  Net -492.4 ml  LBM: Last BM Date: 06/19/19 Baseline Weight: Weight: 92.1 kg Most recent weight: Weight: 92.8 kg       Palliative Assessment/Data:      Patient Active Problem List   Diagnosis Date Noted  . Acute metabolic encephalopathy 50/93/2671  . A-fib ([place]) 06/16/2019  . Paroxysmal atrial fibrillation with rapid ventricular response ([place]) 06/16/2019  . Malignant neoplasm of lung ([place])   . Frontal mass of brain 05/29/2019  . Vasogenic brain edema ([place]) 05/29/2019  . Hilar mass 05/28/2019  . Hyponatremia 05/28/2019  . Metabolic acidosis, normal anion gap (NAG) 05/28/2019  . AF (paroxysmal atrial fibrillation) ([place]) 05/28/2019  . BPH (benign prostatic hyperplasia) 05/28/2019  . AAA (abdominal aortic aneurysm) ([place]) 05/28/2019  . Pneumonia due to infectious agent 05/27/2019  . Malignant pleural effusion 05/27/2019  . AKI (acute kidney injury) ([place]) 05/27/2019  . Hyperkalemia 05/27/2019  . Atrial fibrillation with RVR ([place]) 04/30/2019  . Acute pyelonephritis 04/30/2019  . Parapneumonic effusion 04/30/2019  . Type 2 diabetes mellitus with hyperlipidemia ([place]) 04/30/2019  . Community acquired pneumonia of right upper lobe of lung 04/29/2019  . Vertigo 05/22/2015  . Headache disorder 05/22/2015    Palliative Care Assessment & Plan   Patient Profile: 78 y.o. 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 fib with RVR.  His hospital course has been complicated by severe  delirium with behavioral disturbances.  Palliative consulted for goals of care.  Assessment: Patient Active Problem List   Diagnosis Date Noted  . Acute metabolic encephalopathy 24/58/0998  . A-fib ([place]) 06/16/2019  . Paroxysmal atrial fibrillation with rapid ventricular response ([place]) 06/16/2019  . Malignant neoplasm of lung ([place])   . Frontal mass of brain 05/29/2019  . Vasogenic brain edema ([place]) 05/29/2019  . Hilar mass 05/28/2019  . Hyponatremia 05/28/2019  . Metabolic acidosis, normal anion gap (NAG) 05/28/2019  . AF (paroxysmal atrial fibrillation) ([place]) 05/28/2019  . BPH (benign prostatic hyperplasia) 05/28/2019  . AAA (abdominal aortic aneurysm) ([place]) 05/28/2019  . Pneumonia due to infectious agent 05/27/2019  . Malignant pleural effusion 05/27/2019  . AKI (acute kidney injury) ([place]) 05/27/2019  . Hyperkalemia 05/27/2019  . Atrial fibrillation with RVR ([place]) 04/30/2019  . Acute pyelonephritis 04/30/2019  . Parapneumonic effusion 04/30/2019  . Type 2 diabetes mellitus with hyperlipidemia ([place]) 04/30/2019  . Community acquired pneumonia of right upper lobe of lung 04/29/2019  . Vertigo 05/22/2015  . Headache disorder 05/22/2015   Recommendations/Plan:  Plan for transition home with hospice once DME delivered.  Completed durable DNR and placed on the chart.  Anticipatory counseling provided to his wife regarding symptom management at home.  Goals of Care and Additional Recommendations:  Limitations on Scope of Treatment: Avoid Hospitalization and Full Comfort Care  Code Status:    Code Status Orders  (From admission, onward)         Start     Ordered   06/18/19 1241  Do not attempt resuscitation (DNR)  Continuous    Question Answer Comment  In the event of cardiac or respiratory ARREST Do not call a "code blue"   In the event of cardiac or respiratory ARREST Do not perform Intubation, CPR, defibrillation or ACLS   In the event of cardiac or respiratory  ARREST Use medication by any route, position, wound care, and other measures to relive pain and suffering. May use  oxygen, suction and manual treatment of airway obstruction as needed for comfort.      06/18/19 1240        Code Status History    Date Active Date Inactive Code Status Order ID Comments User Context   06/16/2019 2044 06/18/2019 1240 Full Code [id number]  Peyton Bottoms, MD Inpatient   06/16/2019 0931 06/16/2019 2044 Full Code [id number]  Georgette Shell, MD ED   05/27/2019 1430 06/04/2019 1645 Full Code [id number]  Barb Merino, MD ED   04/30/2019 0137 05/03/2019 1612 Full Code [id number]  Etta Quill, DO Inpatient   Advance Care Planning Activity    Advance Directive Documentation     Most Recent Value  Type of Advance Directive  Healthcare Power of Attorney  Pre-existing out of facility DNR order (yellow form or pink MOST form)  -  "MOST" Form in Place?  -       Prognosis:   < 6 months  Discharge Planning:  Home with Hospice  Care plan was discussed with wife, Dr. Karleen Hampshire  Thank you for allowing the Palliative Medicine Team to assist in the care of this patient.   Total Time 20 Prolonged Time Billed No      Greater than 50%  of this time was spent counseling and coordinating care related to the above assessment and plan.  Micheline Rough, MD  Please contact Palliative Medicine Team phone at [phone] for questions and concerns.

## 2019-06-20 NOTE — TOC Progression Note (Signed)
Transition of Care (TOC) - Progression Note    Patient Details  Name: Brian French. MRN: [id number] Date of Birth: Aug 26, 1940  [facility]) CM/SW Contact  Joaquin Courts, RN Phone Number: 06/20/2019, 10:51 AM  Clinical Narrative: CM notified hospice liaison of impending discharge today. Per hospice, patient will need equipment delivered to home. Hospice liaison to notify CM when equipment is in place for dc.        Expected Discharge Plan: Home w Hospice Care Barriers to Discharge: Equipment Delay  Expected Discharge Plan and Services Expected Discharge Plan: [place] Acute Care Choice: Hospice   Expected Discharge Date: 06/20/19                                     Social Determinants of Health (SDOH) Interventions    Readmission Risk Interventions Readmission Risk Prevention Plan 06/18/2019 06/01/2019  Transportation Screening Complete Complete  Medication Review Press photographer) Complete Complete  PCP or Specialist appointment within 3-5 days of discharge Complete (No Data)  [place] or Home Care Consult Complete -  SW Recovery Care/Counseling Consult Complete -  Palliative Care Screening Not Applicable -  [place] Not Applicable -  Some recent data might be hidden

## 2019-06-20 NOTE — Progress Notes (Signed)
PROGRESS NOTE    Brian French.  WCH:[id number] DOB: 12-01-1940 DOA: 06/16/2019 PCP: [facility]   Brief Narrative:  78 year old male with PAF, recently diag squamous cell ca of the right lower lobe  Currently undergoing radiation therapy, presents with worsening back pain.  On arrival to ED he was found to be in atrial fib with RVR and was started on IV cardizem gtt. CT angio of the chest,  abd and pelvis shows Right fourth and tenth rib fractures, likely pathologic and may account for the patient's right-sided chest pain. Extensive tumor involving the right hemithorax. Progressive metastatic disease involving the abdomen with enlarging and new hepatic metastatic lesions, enlarging left adrenal gland metastasis, enlarging bilateral renal metastasis and a new pancreatic tail metastasis. There also numerous new and enlarging mesenteric and omental implants in the abdomen/pelvis. Suspect enlarging bladder dome mass. Persistent ground-glass opacity in the right upper lobe. Oncology requested for further evaluation. Palliative care consulted for goals of care and symptom management.   After discussion with palliative team and the patient's family/spouse and daughter they were interested to pursue home hospice services as as they feel that patient is not getting better. Plan for discharge in the next 24 hours when the equipment is delivered, possibly tomorrow.    Assessment & Plan:   Principal Problem:   Paroxysmal atrial fibrillation with rapid ventricular response (HCC) Active Problems:   Malignant pleural effusion   Frontal mass of brain   Vasogenic brain edema (HCC)   Malignant neoplasm of lung (HCC)   Acute metabolic encephalopathy   Palliative care encounter   Atrial fibrillation with RVR: Rate better after starting the patient on IV cardizem gtt, transitioned to oral cardizem. Continue with oral cardizem if able to take by mouth.  Last echocardiogram showed Left  ventricular ejection fraction, by visual estimation, is 50 to 55%. The left ventricle has normal function. There is mildly increased left ventricular hypertrophy. Left ventricular diastolic Doppler parameters are consistent with impaired relaxation pattern of LV diastolic filling. Wife reports his PCP discontinued the anti coagulation/ eliquis due to brain mets.  No changes in meds.     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  Patient has received about 6 cycles of radiation treatment. Continue with radiation treatment.  Discussed with Dr Lorette Ang team, no plans to do inpatient treatment at this time and outpatient follow up with oncology recommended.  Patient spouse and daughter would like to pursue hospice services on discharge at this time.  Spouse does not want to pursue any aggressive management at this time.    Hyponatremia  Probably from SIADH  From squamous cell ca of the lung vs dehydration.  Resolved.   Hyperkalemia:  Un clear etiology. Type 4 RTA.  ? Dehydration.  Resolved with Lokelma and IV fluids Potassium wnl today.   Stage 3 b CKD:  Creatinine appears to be at baseline around 1.6.  Improved creatinine at 1.3 today.   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  CBG (last 3)  Recent Labs    06/19/19 1552 06/20/19 0727 06/20/19 1141  GLUCAP 203* 301* 370*   Continue with sliding scale insulin at this time, no further changes to management as pt is being transitioned to  hospice .  a1C IS 10.5.   BPH;  Continue with Flomax   Acute respiratory failure with hypoxia from the above.  Patient on 3 L of nasal cannula oxygen and stable.   Pathological fractures of the right sided ribs.   Optimal pain control, increase Dilaudid to 2 mg every 2 hours as needed. He appears comfortable.    Right frontal lobe metastatic lesion in the brain:  Resume Decadron  Dementia:  Continue with Namenda, no agitation tonight.   In view of multiple medical problems, worsening metastatic disease, poor functional status, clinical deterioration, palliative care consulted for goals of care and symptom appreciate palliative care input and recommendations.   Anemia of chronic disease:  Baseline Hemoglobin between 10 to 11.  Hemoglobin stable around 10 Transfuse to keep hemoglobin greater than 7.    Mild thrombocytopenia:  Monitor, no signs of bleeding   Leukocytosis;  No signs of infection.  Probably from Decadron   DVT prophylaxis: scd's Code Status: DNR, Poor prognosis.  Family Communication: none at bedside.  Disposition Plan: home hospice in am when equipment is delivered.    Consultants: palliative care  Oncology.    Procedures: radiation treatment.   Antimicrobials: None.   Subjective: No new complaints today.  Objective: Vitals:   06/19/19 1805 06/19/19 2000 06/20/19 0657 06/20/19 1233  BP: 123/75 109/76 125/78 123/68  Pulse: 95 100 (!) 122 83  Resp: 14 16 18 14   Temp: 97.8 F (36.6 C) (!) 97.5 F (36.4 C) (!) 97.4 F (36.3 C) (!) 97.3 F (36.3 C)  TempSrc: Oral Oral Oral Oral  SpO2: 95% 93% 97% 96%  Weight:      Height:        Intake/Output Summary (Last 24 hours) at 06/20/2019 1650 Last data filed at 06/20/2019 1322 Gross per 24 hour  Intake 1065.05 ml  Output 1725 ml  Net -659.95 ml   Filed Weights   06/17/19 0500 06/18/19 0500 06/19/19 0500  Weight: 91.1 kg 92.6 kg 92.8 kg    Examination:  General exam: ill appearing, no distress noted.  Respiratory system: air entry fair, no wheezing or rhonchi.  Cardiovascular system: S1S2, IRREGULAR, no JVD.  Gastrointestinal system: abd is soft , non tender non distended bowel sounds good.  Central  nervous system: confused, but able to move all extremities,  Extremities: no pedal edema.  Skin: no rashes seen.  Psychiatry: Confused    Data Reviewed: I have personally reviewed following labs and imaging studies  CBC: Recent Labs  Lab 06/16/19 0500 06/18/19 0513 06/19/19 0549 06/20/19 0556  WBC 26.7* 20.3* 21.0* 18.1*  NEUTROABS 19.6* 16.0* 14.3* 14.2*  HGB 12.3* 9.9* 10.3* 10.3*  HCT 38.1* 30.7* 32.8* 32.4*  MCV 90.3 90.0 92.4 91.8  PLT 167 135* 132* 034*   Basic Metabolic Panel: Recent Labs  Lab 06/18/19 0513 06/18/19 1723 06/19/19 0549 06/19/19 1628 06/20/19 0556  NA 136 137 138 138 141  K 4.2 4.3 4.7 5.0 4.8  CL 101 105 104 106 105  CO2 21* 20* 20* 21* 20*  GLUCOSE 211* 246* 332* 212* 336*  BUN 59* 50* 52* 53* 50*  CREATININE 1.57* 1.42* 1.53* 1.69* 1.36*  CALCIUM 7.9* 7.8* 7.8* 7.7* 7.9*   GFR: Estimated Creatinine Clearance: 53 mL/min (A) (by C-G formula based on SCr of 1.36 mg/dL (H)). Liver Function Tests: Recent Labs  Lab 06/16/19 0500 06/17/19 0220  AST 13* 7*  ALT 15 14  ALKPHOS 128* 96  BILITOT 0.7 1.1  PROT  6.1* 4.9*  ALBUMIN 2.3* 1.9*   Recent Labs  Lab 06/16/19 0500  LIPASE 40   No results for input(s): AMMONIA in the last 168 hours. Coagulation Profile: No results for input(s): INR, PROTIME in the last 168 hours. Cardiac Enzymes: No results for input(s): CKTOTAL, CKMB, CKMBINDEX, TROPONINI in the last 168 hours. BNP (last 3 results) No results for input(s): PROBNP in the last 8760 hours. HbA1C: Recent Labs    06/17/19 1651  HGBA1C 10.5*   CBG: Recent Labs  Lab 06/19/19 0734 06/19/19 1224 06/19/19 1552 06/20/19 0727 06/20/19 1141  GLUCAP 366* 203* 203* 301* 370*   Lipid Profile: No results for input(s): CHOL, HDL, LDLCALC, TRIG, CHOLHDL, LDLDIRECT in the last 72 hours. Thyroid Function Tests: No results for input(s): TSH, T4TOTAL, FREET4, T3FREE, THYROIDAB in the last 72 hours. Anemia Panel: No results for  input(s): VITAMINB12, FOLATE, FERRITIN, TIBC, IRON, RETICCTPCT in the last 72 hours. Sepsis Labs: Recent Labs  Lab 06/16/19 0500 06/16/19 0650 06/17/19 1651  LATICACIDVEN 3.2* 2.6* 1.5    Recent Results (from the past 240 hour(s))  Blood culture (routine x 2)     Status: None (Preliminary result)   Collection Time: 06/16/19  5:00 AM   Specimen: BLOOD RIGHT ARM  Result Value Ref Range Status   Specimen Description   Final    BLOOD RIGHT ARM Performed at [facility], [address] [place], [place] [address]    Special Requests   Final    BOTTLES DRAWN AEROBIC AND ANAEROBIC Blood Culture adequate volume Performed at [facility], [address] [place], [place] [address]    Culture   Final    NO GROWTH 4 DAYS Performed at [facility], [place] [address], [place], [place] [address]    Report Status PENDING  Incomplete  Blood culture (routine x 2)     Status: None (Preliminary result)   Collection Time: 06/16/19  5:30 AM   Specimen: BLOOD RIGHT HAND  Result Value Ref Range Status   Specimen Description   Final    BLOOD RIGHT HAND Performed at [facility], [address] [place], [place] [address]    Special Requests   Final    BOTTLES DRAWN AEROBIC AND ANAEROBIC Blood Culture results may not be optimal due to an inadequate volume of blood received in culture bottles Performed at [facility], [address] [place], [place] [address]    Culture   Final    NO GROWTH 4 DAYS Performed at [facility], [place] [address] [place], [place] [address]    Report Status PENDING  Incomplete  SARS CORONAVIRUS 2 (TAT 6-24 HRS) Nasopharyngeal Nasopharyngeal Swab     Status: None   Collection Time: 06/16/19  5:35 AM   Specimen: Nasopharyngeal Swab  Result Value Ref Range Status   SARS Coronavirus 2 NEGATIVE NEGATIVE Final    Comment: (NOTE) SARS-CoV-2 target nucleic acids are NOT DETECTED. The SARS-CoV-2 RNA is generally  detectable in upper and lower respiratory specimens during the acute phase of infection. Negative results do not preclude SARS-CoV-2 infection, do not rule out co-infections with other pathogens, and should not be used as the sole basis for treatment or other patient management decisions. Negative results must be combined with clinical observations, patient history, and epidemiological information. The expected result is Negative. Fact Sheet for Patients: SugarRoll.be Fact Sheet for Healthcare Providers: https://www.woods-mathews.com/ This test is not yet  approved or cleared by the [place] and  has been authorized for detection and/or diagnosis of SARS-CoV-2 by FDA under an Emergency Use Authorization (EUA). This EUA will remain  in effect (meaning this test can be used) for the duration of the COVID-19 declaration under Section 56 4(b)(1) of the Act, 21 U.S.C. section 360bbb-3(b)(1), unless the authorization is terminated or revoked sooner. Performed at [facility], [place] [address] [place], [place] [address]   Urine culture     Status: None   Collection Time: 06/16/19  5:55 AM   Specimen: Urine, Random  Result Value Ref Range Status   Specimen Description   Final    URINE, RANDOM Performed at [facility], [address] [place], [place] [address]    Special Requests   Final    NONE Performed at [facility], [address] [place], [place] [address]    Culture   Final    NO GROWTH Performed at [facility], [place] [address] [place], [place] [address]    Report Status 06/17/2019 FINAL  Final         Radiology Studies: No results found.      Scheduled Meds: . aspirin EC  81 mg Oral Daily  . Chlorhexidine Gluconate Cloth  6 each Topical Daily  . dexamethasone  4 mg Oral Daily  . diltiazem  60 mg Oral Q6H  . docusate sodium  100 mg Oral Daily  . feeding supplement (ENSURE  ENLIVE)  237 mL Oral TID BM  . insulin aspart  0-15 Units Subcutaneous TID WC  . insulin aspart  0-5 Units Subcutaneous QHS  . memantine  10 mg Oral QHS  . multivitamin with minerals  1 tablet Oral Daily  . pantoprazole  40 mg Oral Daily  . QUEtiapine  50 mg Oral QHS  . tamsulosin  0.4 mg Oral Daily   Continuous Infusions:    LOS: 4 days        Hosie Poisson, MD [facility] 06/20/2019, 4:50 PM

## 2019-06-20 NOTE — Progress Notes (Signed)
MEWS/VS Documentation      06/19/2019 2000 06/19/2019 2009 06/20/2019 0657 06/20/2019 1233   MEWS Score:  0  0  2  0   MEWS Score Color:  Green  Green  Yellow  Green   Resp:  16  -  18  14   Pulse:  100  -  (!) 122  83   BP:  109/76  -  125/78  123/68   Temp:  (!) 97.5 F (36.4 C)  -  (!) 97.4 F (36.3 C)  (!) 97.3 F (36.3 C)   O2 Device:  Room Air  -  Room Air  Room Air   Level of Consciousness:  -  Alert  -  -     No acute changes. Delora Fuel RN

## 2019-06-20 NOTE — Progress Notes (Signed)
   06/20/19 0703  MEWS Assessment  Is this an acute change? No  Pt HR 110s this AM, known a. Fib, not an acute change. Hortencia Conradi RN

## 2019-06-21 ENCOUNTER — Ambulatory Visit: Payer: No Typology Code available for payment source | Admitting: Hematology

## 2019-06-21 ENCOUNTER — Other Ambulatory Visit: Payer: No Typology Code available for payment source

## 2019-06-21 ENCOUNTER — Encounter: Payer: Self-pay | Admitting: *Deleted

## 2019-06-21 ENCOUNTER — Ambulatory Visit: Payer: Medicare Other

## 2019-06-21 DIAGNOSIS — N179 Acute kidney failure, unspecified: Secondary | ICD-10-CM

## 2019-06-21 LAB — CBC WITH DIFFERENTIAL/PLATELET
Abs Immature Granulocytes: 0.8 10*3/uL — ABNORMAL HIGH (ref 0.00–0.07)
Basophils Absolute: 0.1 10*3/uL (ref 0.0–0.1)
Basophils Relative: 0 %
Eosinophils Absolute: 0.6 10*3/uL — ABNORMAL HIGH (ref 0.0–0.5)
Eosinophils Relative: 3 %
HCT: 33.2 % — ABNORMAL LOW (ref 39.0–52.0)
Hemoglobin: 10.4 g/dL — ABNORMAL LOW (ref 13.0–17.0)
Immature Granulocytes: 4 %
Lymphocytes Relative: 5 %
Lymphs Abs: 1.1 10*3/uL (ref 0.7–4.0)
MCH: 28.8 pg (ref 26.0–34.0)
MCHC: 31.3 g/dL (ref 30.0–36.0)
MCV: 92 fL (ref 80.0–100.0)
Monocytes Absolute: 1.7 10*3/uL — ABNORMAL HIGH (ref 0.1–1.0)
Monocytes Relative: 9 %
Neutro Abs: 15.3 10*3/uL — ABNORMAL HIGH (ref 1.7–7.7)
Neutrophils Relative %: 79 %
Platelets: 136 10*3/uL — ABNORMAL LOW (ref 150–400)
RBC: 3.61 MIL/uL — ABNORMAL LOW (ref 4.22–5.81)
RDW: 16.3 % — ABNORMAL HIGH (ref 11.5–15.5)
WBC: 19.5 10*3/uL — ABNORMAL HIGH (ref 4.0–10.5)
nRBC: 0 % (ref 0.0–0.2)

## 2019-06-21 LAB — CULTURE, BLOOD (ROUTINE X 2)
Culture: NO GROWTH
Culture: NO GROWTH
Special Requests: ADEQUATE

## 2019-06-21 LAB — GLUCOSE, CAPILLARY
Glucose-Capillary: 312 mg/dL — ABNORMAL HIGH (ref 70–99)
Glucose-Capillary: 211 mg/dL — ABNORMAL HIGH (ref 70–99)

## 2019-06-21 MED ORDER — HYDROMORPHONE HCL 2 MG PO TABS: 2.0000 mg | ORAL_TABLET | Freq: Four times a day (QID) | ORAL | 0 refills | Status: AC | PRN

## 2019-06-21 MED ORDER — QUETIAPINE FUMARATE 50 MG PO TABS: 50.0000 mg | ORAL_TABLET | Freq: Every day | ORAL | 0 refills | Status: AC

## 2019-06-21 MED ORDER — LORAZEPAM 1 MG PO TABS: 1.0000 mg | ORAL_TABLET | Freq: Four times a day (QID) | ORAL | 0 refills | Status: AC | PRN

## 2019-06-21 NOTE — TOC Transition Note (Signed)
Transition of Care [facility]) - CM/SW Discharge Note   Patient Details  Name: Kymir Coles. MRN: [id number] Date of Birth: [birth year]  [facility] CM/SW Contact:  Wende Neighbors, LCSW Phone Number: 06/21/2019, 11:48 AM   Clinical Narrative:   Patient to discharge home with hospice following. Patients wife is at home waiting for him and is aware of discharge today. CSW has made [facility] [place] aware of patients discharge. [facility] has been called for pickup     Final next level of care: Home w Hospice Care Barriers to Discharge: No Barriers Identified   Patient Goals and CMS Choice     Choice offered to / list presented to : Spouse  Discharge Placement                Patient to be transferred to facility by: ptar Name of family member notified: spoke with wife via phone Patient and family notified of of transfer: 06/21/19  Discharge Plan and Services     Post Acute Care Choice: Hospice                               Social Determinants of Health (SDOH) Interventions     Readmission Risk Interventions Readmission Risk Prevention Plan 06/18/2019 06/01/2019  Transportation Screening Complete Complete  Medication Review Press photographer) Complete Complete  PCP or Specialist appointment within 3-5 days of discharge Complete (No Data)  [place] or Home Care Consult Complete -  SW Recovery Care/Counseling Consult Complete -  Palliative Care Screening Not Applicable -  [place] Not Applicable -  Some recent data might be hidden

## 2019-06-21 NOTE — Progress Notes (Signed)
Oncology Nurse Navigator Documentation  Oncology Nurse Navigator Flowsheets 06/21/2019  Navigator Follow Up Date: -  Navigator Follow Up Reason: -  Navigator Location [facility]  Navigator Encounter Type Other/I called and update [facility] navigator that patient is going on Hospice and will not need med onc but is getting rad onc.    Telephone -  Patient Visit Type -  Treatment Phase -  Barriers/Navigation Needs Coordination of Care  Education -  Interventions Coordination of Care  Acuity Level 2-Minimal Needs (1-2 Barriers Identified)  Coordination of Care Other  Time Spent with Patient 15

## 2019-06-21 NOTE — Care Management Important Message (Signed)
Important Message  Patient Details IM Letter given to Rhea Pink SW to present to the Patient Name: Brian French. MRN: [id number] Date of Birth: 10-01-1940   Medicare Important Message Given:  Yes     Kerin Salen 06/21/2019, 10:27 AM

## 2019-06-21 NOTE — Progress Notes (Signed)
I called and spoke with Chancy Hurter, the patient's daughter to confirm the plan to discharge today with hospice care. She is in agreement to discontinue any further radiation therapy and I will cancel his appoinments in our department and also inform the providers at the [facility] about this change in his course.     Carola Rhine, PAC

## 2019-06-22 ENCOUNTER — Inpatient Hospital Stay: Payer: Medicare Other | Attending: Radiation Oncology

## 2019-06-22 ENCOUNTER — Ambulatory Visit: Payer: Medicare Other

## 2019-06-23 ENCOUNTER — Ambulatory Visit: Payer: Medicare Other

## 2019-06-24 ENCOUNTER — Ambulatory Visit: Payer: Medicare Other

## 2019-06-24 NOTE — Discharge Summary (Signed)
Physician Discharge Summary  Brian French. BOF:[id number] DOB: Sep 06, 1940 DOA: 06/16/2019  PCP: [facility]  Admit date: 06/16/2019 Discharge date: 06/21/2019  Admitted From: [facility].  Disposition:  Home with Hospice.   Recommendations for Outpatient Follow-up:  1. Follow up with hospice MD As needed.    Discharge Condition: hospice.  CODE STATUS: DNR Diet recommendation: comfort feeds.   Brief/Interim Summary: 78 year old male with PAF, recently diag squamous cell ca of the right lower lobe  Currently undergoing radiation therapy, presents with worsening back pain.  On arrival to ED he was found to be in atrial fib with RVR and was started on IV cardizem gtt. CT angio of the chest,  abd and pelvis shows Right fourth and tenth rib fractures, likely pathologic and may account for the patient's right-sided chest pain. Extensive tumor involving the right hemithorax. Progressive metastatic disease involving the abdomen with enlarging and new hepatic metastatic lesions, enlarging left adrenal gland metastasis, enlarging bilateral renal metastasis and a new pancreatic tail metastasis. There also numerous new and enlarging mesenteric and omental implants in the abdomen/pelvis. Suspect enlarging bladder dome mass. Persistent ground-glass opacity in the right upper lobe. Oncology requested for further evaluation. Palliative care consulted for goals of care and symptom management.   After discussion with palliative team and the patient's family/spouse and daughter they were interested to pursue home hospice services as as they feel that patient is not getting better. Plan for discharge when the equipment is delivered.   Discharge Diagnoses:  Principal Problem:   Paroxysmal atrial fibrillation with rapid ventricular response (HCC) Active Problems:   Malignant pleural effusion   Frontal mass of brain   Vasogenic brain edema (HCC)   Malignant neoplasm of lung (HCC)   Acute  metabolic encephalopathy   Palliative care encounter  Atrial fibrillation with RVR: Rate better after starting the patient on IV cardizem gtt, transitioned to oral cardizem. Continue with oral cardizem if able to take by mouth.  Last echocardiogram showed Left ventricular ejection fraction, by visual estimation, is 50 to 55%. The left ventricle has normal function. There is mildly increased left ventricular hypertrophy. Left ventricular diastolic Doppler parameters are consistent with impaired relaxation pattern of LV diastolic filling. Wife reports his PCP discontinued the anti coagulation/ eliquis due to brain mets.  No changes in meds.     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  Patient has received about 6 cycles of radiation treatment. Continue with radiation treatment.  Discussed with Dr Lorette Ang team, no plans to do inpatient treatment at this time and outpatient follow up with oncology recommended.  Patient spouse and daughter would like to pursue hospice services on discharge at this time.  Spouse does not want to pursue any aggressive management at this time.    Hyponatremia  Probably from SIADH  From squamous cell ca of the lung vs dehydration.  Resolved.   Hyperkalemia:  Un clear etiology. Type 4 RTA.  ? Dehydration.  Resolved with Lokelma and IV fluids Potassium wnl today.   Stage 3 b CKD:  Creatinine appears to be at baseline around 1.6.  Improved creatinine at 1.3 today.   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  CBG (last 3)  Recent Labs (last 2  labs)        Recent Labs    06/19/19 1552 06/20/19 0727 06/20/19 1141  GLUCAP 203* 301* 370*     Continue with sliding  scale insulin at this time, no further changes to management as pt is being transitioned to hospice .  a1C IS 10.5.   BPH;  Continue with Flomax   Acute respiratory failure with hypoxia from the above.  Patient on 3 L of nasal cannula oxygen and stable.   Pathological fractures of the right sided ribs.  Optimal pain control, increase Dilaudid to 2 mg every 2 hours as needed. He appears comfortable.    Right frontal lobe metastatic lesion in the brain:  Resume Decadron  Dementia:  Continue with Namenda, no agitation.  In view of multiple medical problems, worsening metastatic disease, poor functional status, clinical deterioration, palliative care consulted for goals of care and symptom appreciate palliative care input and recommendations.   Anemia of chronic disease:  Baseline Hemoglobin between 10 to 11.  Hemoglobin stable around 10    Mild thrombocytopenia:  Monitor, no signs of bleeding   Leukocytosis;  No signs of infection.  Probably from Decadron     Discharge Instructions  Discharge Instructions    Discharge instructions   Complete by: As directed    Please follow up with Hospice MD as needed.     Allergies as of 06/21/2019   No Known Allergies     Medication List    STOP taking these medications   HYDROcodone-acetaminophen 5-325 MG tablet Commonly known as: NORCO/VICODIN   pioglitazone 45 MG tablet Commonly known as: ACTOS     TAKE these medications   acetaminophen 500 MG tablet Commonly known as: TYLENOL Take 1,000 mg by mouth every 6 (six) hours as needed for mild pain or headache.   aspirin EC 81 MG tablet Take 81 mg by mouth daily.   dexamethasone 4 MG tablet Commonly known as: DECADRON Take 1 tablet (4 mg total) by mouth daily.   diltiazem 120 MG 24 hr capsule Commonly known as: Cardizem CD Take 1 capsule (120 mg total) by mouth daily.   docusate sodium 100 MG capsule Commonly known as: COLACE Take 1 capsule  (100 mg total) by mouth daily.   feeding supplement (ENSURE ENLIVE) Liqd Take 237 mLs by mouth 3 (three) times daily between meals.   Fish Oil 1000 MG Caps Take 2,000 mg by mouth 2 (two) times daily.   HYDROmorphone 2 MG tablet Commonly known as: DILAUDID Take 1 tablet (2 mg total) by mouth every 6 (six) hours as needed for up to 3 days for severe pain (hold for sedation).   insulin aspart 100 UNIT/ML injection Commonly known as: novoLOG Inject 20 Units into the skin as needed for high blood sugar.   lidocaine 5 % Commonly known as: Lidoderm Place 1 patch onto the skin daily. Remove & Discard patch within 12 hours or as directed by MD   LORazepam 1 MG tablet Commonly known as: ATIVAN Take 1 tablet (1 mg total) by mouth every 6 (six) hours as needed for up to 3 days for anxiety.   memantine 10 MG tablet Commonly known as: NAMENDA Take 10 mg by mouth at bedtime.   multivitamin with minerals Tabs tablet Take 1 tablet by mouth daily.   omeprazole 20 MG capsule Commonly known as: PRILOSEC Take 20 mg by mouth daily.   ondansetron 8 MG tablet Commonly known as: ZOFRAN Take 4 mg by mouth 3 (three)  times daily as needed for nausea or vomiting (30 minutes before meals).   promethazine 12.5 MG tablet Commonly known as: PHENERGAN Take 1 tablet (12.5 mg total) by mouth 3 (three) times daily.   QUEtiapine 50 MG tablet Commonly known as: SEROQUEL Take 1 tablet (50 mg total) by mouth at bedtime.   senna 8.6 MG tablet Commonly known as: SENOKOT Take 1 tablet by mouth daily.   tamsulosin 0.4 MG Caps capsule Commonly known as: FLOMAX Take 0.4 mg by mouth daily.      Follow-up Information    Clinic, [place]. Schedule an appointment as soon as possible for a visit in 1 week(s).   Contact information: [place] [address] [phone]          No Known Allergies  Consultations:  Palliative care  Oncology curb side.     Procedures/Studies: CT HEAD WO CONTRAST  Result Date: 06/03/2019 CLINICAL DATA:  Follow-up possible metastasis on MRI EXAM: CT HEAD WITHOUT CONTRAST TECHNIQUE: Contiguous axial images were obtained from the base of the skull through the vertex without intravenous contrast. COMPARISON:  Correlation made with prior MR imaging FINDINGS: Brain: In the region of the right frontal lesion on MRI, there is some ill-defined hypoattenuation. No mass effect or acute hemorrhage. There is no extra-axial fluid collection. Ventricles and sulci are mildly prominent, reflecting parenchymal volume loss. Patchy and confluent areas of hypoattenuation in the supratentorial white matter are nonspecific but may reflect mild to moderate chronic microvascular ischemic changes. Vascular: There is minor intracranial atherosclerotic calcification at the skull base. Skull: Calvarium is unremarkable. Sinuses/Orbits: No acute finding. Other: None. IMPRESSION: Some ill-defined hypoattenuation in the region of right frontal lesion on recent prior MRI. There is no mass effect or hemorrhage. Followup MR imaging is recommended over a greater interval. Electronically Signed   By: Macy Mis M.D.   On: 06/03/2019 14:28   CT CHEST WO CONTRAST  Result Date: 05/28/2019 CLINICAL DATA:  History of metastatic non-small cell lung cancer. EXAM: CT CHEST WITHOUT CONTRAST TECHNIQUE: Multidetector CT imaging of the chest was performed following the standard protocol without IV contrast. COMPARISON:  CT angio chest 04/29/2019 FINDINGS: Cardiovascular: The heart size appears normal. There is a small pericardial effusion. Stable to slightly decreased in volume from previous exam. Aortic atherosclerosis. Left main, lad, left circumflex and RCA coronary artery calcifications Mediastinum/Nodes: Normal appearance of the thyroid gland. The trachea appears patent and is midline. Normal appearance of the esophagus. 1 cm right paratracheal lymph node is  identified, 5/3. Unchanged. Enlarged subcarinal lymph node is difficult to measure due to lack of IV contrast material and progressive right pleural effusion extending into the subcarinal region. The best estimate is this measures approximate 2.7 cm, image 79/3. Previously 3 cm. The hilar structures are suboptimally evaluated due to lack of IV contrast material. Lungs/Pleura: Large right pleural effusion is increased volume when compared with previous exam. There is persistent and progressive atelectasis of the right middle lobe. Presumed central obstructing lesion is difficult to identify to lack of IV contrast material. Rounded density within the right upper lobe appears smaller but progressively more solid measuring 2.5 x 2.0 cm, image 38/4. Previously 2.5 x 3.1 cm. Upper Abdomen: No acute abnormality identified. Enlarged left adrenal gland is partially imaged. Musculoskeletal: No chest wall mass or suspicious bone lesions identified. IMPRESSION: 1. Comparison to previous contrast enhanced CT of the chest from 05/08/2019 is significantly limited largely due to lack of IV contrast material. 2.  Interval increase in volume of right pleural effusion. There is progressive atelectasis within the right middle lobe which may be postobstructive. Central right hilar lesion cannot be excluded. 3. Persistent enlarged subcarinal lymph node as noted on previous exam. Comparison with the prior study however is limited due to lack of IV contrast material. 4. Right upper lobe nodular density is smaller but more solid when compared with previous exam. This is nonspecific but may represent an area progressive organizing pneumonia. Electronically Signed   By: Kerby Moors M.D.   On: 05/28/2019 07:22   CT Angio Chest PE W and/or Wo Contrast  Result Date: 06/16/2019 CLINICAL DATA:  Right-sided chest pain. History of right lung cancer. EXAM: CT ANGIOGRAPHY CHEST CT ABDOMEN AND PELVIS WITH CONTRAST TECHNIQUE: Multidetector CT  imaging of the chest was performed using the standard protocol during bolus administration of intravenous contrast. Multiplanar CT image reconstructions and MIPs were obtained to evaluate the vascular anatomy. Multidetector CT imaging of the abdomen and pelvis was performed using the standard protocol during bolus administration of intravenous contrast. CONTRAST:  29mL OMNIPAQUE IOHEXOL 350 MG/ML SOLN COMPARISON:  CT chest 05/28/2019 and CT abdomen/pelvis 05/08/2019 FINDINGS: CTA CHEST FINDINGS Cardiovascular: The heart is within normal limits in size for age. No pericardial effusion. There is mild tortuosity, ectasia and calcification of the thoracic aorta. Three-vessel coronary artery calcifications are noted. The pulmonary arterial tree is fairly well opacified. No filling defects to suggest pulmonary embolism. Mediastinum/Nodes: Stable bulk E right mediastinal tumor in the subcarinal region surrounding the bronchus intermedius and extending into the right infrahilar region. This measures a maximum of 5.2 x 5.0 cm. Stable 9 mm paratracheal lymph node on image number 31/4. The esophagus is grossly normal. Lungs/Pleura: Persistent large right middle lobe lung mass invading the right infrahilar region. Associated complex/loculated right pleural fluid collection containing a PleurX drainage catheter. Ill-defined nodularity/infiltrating tumor in the epicardial fat. Persistent ground-glass opacity at the right lung apex measuring 2.8 x 2.7 cm. There is also a 15 mm nodule associated with the right major fissure which is likely loculated fluid. The left lung is relatively clear. Musculoskeletal: Probable pathologic fracture involving the right fourth anterior rib which may account for the patient's chest pain. There may also be a pathologic fracture of the tenth rib laterally. Remote healed right tenth rib fracture. Review of the MIP images confirms the above findings. CT ABDOMEN and PELVIS FINDINGS Hepatobiliary:  Progressive hepatic metastatic disease. 2.7 cm lesion in segment 2 previously measured 1.5 cm. 14 mm lesion in segment 8 appears to be new. There is also an ill-defined 2.5 cm lesion in the subdiaphragmatic fat anterior to the liver on image 22/11 New 10 mm lesion in segment 6. The gallbladder appears normal.  No common bile duct dilatation. Pancreas: There is a new ill-defined lesion in the pancreatic tail measuring 2 cm on image 32/11 which is likely a metastasis. Spleen: Normal size.  No focal lesions. Adrenals/Urinary Tract: Enlarging left adrenal gland metastasis measuring 2.5 cm and previously measuring 1.5 cm. Numerous ill-defined enlarging renal lesions most likely metastatic disease. Lower pole right renal lesion anteriorly measures 3.2 cm and previously measured 2.3 cm. 2.8 cm midpole left renal lesion previously measured 1.9 cm. Enlarging bladder mass at the dome. Stomach/Bowel: The stomach, duodenum, small bowel and colon are grossly normal. No acute inflammatory changes, mass lesions or obstructive findings. Vascular/Lymphatic: Advanced atherosclerotic calcifications involving the aorta and iliac arteries and branch vessels. Numerous small mesenteric and omental implants are noted  throughout the abdomen. Index omental lesion on image 54/11 measures 19 mm. No retroperitoneal lymphadenopathy. Reproductive: The prostate gland and seminal vesicles are unremarkable. Other: No pelvic mass or pelvic lymphadenopathy. Pelvic implants are noted. Musculoskeletal: Do not see any obvious metastatic bone lesions. There may be a small lytic lesion just above the right acetabulum. Review of the MIP images confirms the above findings. IMPRESSION: 1. No CT findings for pulmonary embolism. 2. No aortic aneurysm or dissection. 3. Right fourth and tenth rib fractures, likely pathologic and may account for the patient's right-sided chest pain. 4. Extensive tumor involving the right hemithorax as detailed above. Findings  progressive since prior study. 5. Progressive metastatic disease involving the abdomen with enlarging and new hepatic metastatic lesions, enlarging left adrenal gland metastasis, enlarging bilateral renal metastasis and a new pancreatic tail metastasis. There also numerous new and enlarging mesenteric and omental implants in the abdomen/pelvis. 6. Suspect enlarging bladder dome mass. 7. Persistent ground-glass opacity in the right upper lobe. Electronically Signed   By: Marijo Sanes M.D.   On: 06/16/2019 07:14   MR BRAIN WO CONTRAST  Result Date: 05/29/2019 CLINICAL DATA:  Combined small and non smell also lung cancer. Staging. EXAM: MRI HEAD WITHOUT CONTRAST TECHNIQUE: Multiplanar, multiecho pulse sequences of the brain and surrounding structures were obtained without intravenous contrast. COMPARISON:  MRI head 05/09/2015 FINDINGS: Brain: New lesion in the right frontal lobe with surrounding edema and mild hemorrhage. This is highly likely to be metastatic disease. Postcontrast imaging recommended to confirm. Multiple small white matter hyperintensities bilaterally are indeterminate. Small lesions in the right parietal subcortical white matter are suspicious for metastatic disease but contrast enhanced imaging is needed. Generalized atrophy.  Negative for acute infarct. Vascular: Normal arterial flow voids Skull and upper cervical spine: No focal skeletal abnormality. Sinuses/Orbits: Negative Other: Image quality degraded by motion. Limited evaluation for metastatic disease without intravenous contrast IMPRESSION: Hemorrhagic lesion right frontal lobe less than 1 cm with surrounding edema, highly suspicious for metastatic disease. Recommend postcontrast imaging for confirmation and evaluate for other possible lesions. Generalized atrophy.  No acute infarct. Electronically Signed   By: Franchot Gallo M.D.   On: 05/29/2019 11:13   MR BRAIN W CONTRAST  Result Date: 05/29/2019 CLINICAL DATA:  Lung cancer  rule out metastatic disease. EXAM: MRI HEAD WITH CONTRAST TECHNIQUE: Multiplanar, multiecho pulse sequences of the brain and surrounding structures were obtained with intravenous contrast. CONTRAST:  67mL GADAVIST GADOBUTROL 1 MMOL/ML IV SOLN COMPARISON:  Unenhanced MRI head 05/29/2019 FINDINGS: Right frontal lobe abnormality shows stellate enhancement. This area showed susceptibility suggestive of hemorrhage with surrounding edema. The stellate pattern of enhancement is unusual for metastatic disease. No other enhancing lesions in the brain identified. Ventricle size normal.  No midline shift. IMPRESSION: Right frontal lobe lesion shows stellate enhancement. This pattern is unusual for metastatic disease. Given the susceptibility and surrounding edema, continue to favor metastatic disease however enhancing subacute infarct is a consideration and short-term MRI follow-up without and with contrast is recommended in 2-4 weeks. Electronically Signed   By: Franchot Gallo M.D.   On: 05/29/2019 13:06   CT ABDOMEN PELVIS W CONTRAST  Result Date: 06/16/2019 CLINICAL DATA:  Right-sided chest pain. History of right lung cancer. EXAM: CT ANGIOGRAPHY CHEST CT ABDOMEN AND PELVIS WITH CONTRAST TECHNIQUE: Multidetector CT imaging of the chest was performed using the standard protocol during bolus administration of intravenous contrast. Multiplanar CT image reconstructions and MIPs were obtained to evaluate the vascular anatomy. Multidetector CT imaging  of the abdomen and pelvis was performed using the standard protocol during bolus administration of intravenous contrast. CONTRAST:  71mL OMNIPAQUE IOHEXOL 350 MG/ML SOLN COMPARISON:  CT chest 05/28/2019 and CT abdomen/pelvis 05/08/2019 FINDINGS: CTA CHEST FINDINGS Cardiovascular: The heart is within normal limits in size for age. No pericardial effusion. There is mild tortuosity, ectasia and calcification of the thoracic aorta. Three-vessel coronary artery calcifications are  noted. The pulmonary arterial tree is fairly well opacified. No filling defects to suggest pulmonary embolism. Mediastinum/Nodes: Stable bulk E right mediastinal tumor in the subcarinal region surrounding the bronchus intermedius and extending into the right infrahilar region. This measures a maximum of 5.2 x 5.0 cm. Stable 9 mm paratracheal lymph node on image number 31/4. The esophagus is grossly normal. Lungs/Pleura: Persistent large right middle lobe lung mass invading the right infrahilar region. Associated complex/loculated right pleural fluid collection containing a PleurX drainage catheter. Ill-defined nodularity/infiltrating tumor in the epicardial fat. Persistent ground-glass opacity at the right lung apex measuring 2.8 x 2.7 cm. There is also a 15 mm nodule associated with the right major fissure which is likely loculated fluid. The left lung is relatively clear. Musculoskeletal: Probable pathologic fracture involving the right fourth anterior rib which may account for the patient's chest pain. There may also be a pathologic fracture of the tenth rib laterally. Remote healed right tenth rib fracture. Review of the MIP images confirms the above findings. CT ABDOMEN and PELVIS FINDINGS Hepatobiliary: Progressive hepatic metastatic disease. 2.7 cm lesion in segment 2 previously measured 1.5 cm. 14 mm lesion in segment 8 appears to be new. There is also an ill-defined 2.5 cm lesion in the subdiaphragmatic fat anterior to the liver on image 22/11 New 10 mm lesion in segment 6. The gallbladder appears normal.  No common bile duct dilatation. Pancreas: There is a new ill-defined lesion in the pancreatic tail measuring 2 cm on image 32/11 which is likely a metastasis. Spleen: Normal size.  No focal lesions. Adrenals/Urinary Tract: Enlarging left adrenal gland metastasis measuring 2.5 cm and previously measuring 1.5 cm. Numerous ill-defined enlarging renal lesions most likely metastatic disease. Lower pole right  renal lesion anteriorly measures 3.2 cm and previously measured 2.3 cm. 2.8 cm midpole left renal lesion previously measured 1.9 cm. Enlarging bladder mass at the dome. Stomach/Bowel: The stomach, duodenum, small bowel and colon are grossly normal. No acute inflammatory changes, mass lesions or obstructive findings. Vascular/Lymphatic: Advanced atherosclerotic calcifications involving the aorta and iliac arteries and branch vessels. Numerous small mesenteric and omental implants are noted throughout the abdomen. Index omental lesion on image 54/11 measures 19 mm. No retroperitoneal lymphadenopathy. Reproductive: The prostate gland and seminal vesicles are unremarkable. Other: No pelvic mass or pelvic lymphadenopathy. Pelvic implants are noted. Musculoskeletal: Do not see any obvious metastatic bone lesions. There may be a small lytic lesion just above the right acetabulum. Review of the MIP images confirms the above findings. IMPRESSION: 1. No CT findings for pulmonary embolism. 2. No aortic aneurysm or dissection. 3. Right fourth and tenth rib fractures, likely pathologic and may account for the patient's right-sided chest pain. 4. Extensive tumor involving the right hemithorax as detailed above. Findings progressive since prior study. 5. Progressive metastatic disease involving the abdomen with enlarging and new hepatic metastatic lesions, enlarging left adrenal gland metastasis, enlarging bilateral renal metastasis and a new pancreatic tail metastasis. There also numerous new and enlarging mesenteric and omental implants in the abdomen/pelvis. 6. Suspect enlarging bladder dome mass. 7. Persistent ground-glass opacity in  the right upper lobe. Electronically Signed   By: Marijo Sanes M.D.   On: 06/16/2019 07:14   US Renal  Result Date: 05/31/2019 CLINICAL DATA:  Acute kidney injury. EXAM: RENAL / URINARY TRACT ULTRASOUND COMPLETE COMPARISON:  CT 05/08/2019 FINDINGS: Right Kidney: Renal measurements: 10.7 x  4.6 x 4.4 cm = volume: 112.4 mL. Thinning of renal parenchyma with increased renal echogenicity. No hydronephrosis. Multiple (less than 10) cysts within the kidney, all less than 1 cm. No obvious intra or perirenal fluid collection. Left Kidney: Renal measurements: 14.0 x 7.9 x 6.0 cm = volume: 349 mL. Slight increased renal echogenicity. No hydronephrosis. Multiple (less than 10) cysts within the left kidney. Largest cyst superior medially measures 4.9 cm greatest dimension. No obvious intrarenal or perirenal fluid collection. Bladder: Appears normal for degree of bladder distention. Both ureteral jets are visualized. Other: None. IMPRESSION: 1. No obstructive uropathy. 2. Increased bilateral renal echogenicity with mild thinning of the right renal parenchyma suggesting chronic medical renal disease. 3. Bilateral renal cysts. Electronically Signed   By: Keith Rake M.D.   On: 05/31/2019 23:46   DG Chest Portable 1 View  Result Date: 06/16/2019 CLINICAL DATA:  Chest pain.  History of lung cancer. EXAM: PORTABLE CHEST 1 VIEW COMPARISON:  06/01/2019 FINDINGS: Right-sided PleurX drainage type catheter in place. The cardiac silhouette, mediastinal and hilar contours are within normal limits and stable. Persistent right basilar density, likely a combination of tumor and atelectasis but no large recurrent effusion. The left lung remains clear. IMPRESSION: 1. Stable PleurX drainage catheter without recurrent effusion. 2. Stable right medial basilar density. Electronically Signed   By: Marijo Sanes M.D.   On: 06/16/2019 05:37   DG CHEST PORT 1 VIEW  Result Date: 06/01/2019 CLINICAL DATA:  Chest tube EXAM: PORTABLE CHEST 1 VIEW COMPARISON:  05/31/2019 FINDINGS: Interval placement of PleurX on the right. No visible pneumothorax. Continued suspected layering right effusion. Right infrahilar atelectasis again noted. Left lung clear. Heart is upper limits normal in size. No acute bony abnormality lateral left 6th  rib fracture, chronic. IMPRESSION: Interval placement of right PleurX catheter. No pneumothorax. Probable continued layering right effusion with right infrahilar atelectasis. Electronically Signed   By: Rolm Baptise M.D.   On: 06/01/2019 19:18   DG Chest Port 1 View  Result Date: 05/31/2019 CLINICAL DATA:  Status post thoracentesis. EXAM: PORTABLE CHEST 1 VIEW COMPARISON:  May 30, 2019. FINDINGS: Stable cardiomediastinal silhouette. Right pleural effusion appears to be smaller. Right infrahilar atelectasis is noted. Left lung is clear. No pneumothorax is noted. Bony thorax is unremarkable. IMPRESSION: No pneumothorax status post right-sided thoracentesis. Right pleural effusion appears smaller. Electronically Signed   By: Marijo Conception M.D.   On: 05/31/2019 14:15   DG CHEST PORT 1 VIEW  Result Date: 05/30/2019 CLINICAL DATA:  Dyspnea, pleural effusion EXAM: PORTABLE CHEST 1 VIEW COMPARISON:  05/28/2019 FINDINGS: Slight interval increase in a moderate, layering right pleural effusion with associated atelectasis or consolidation. The left lung is normally aerated. Cardiomegaly. IMPRESSION: 1. Slight interval increase in a moderate, layering right pleural effusion with associated atelectasis or consolidation. 2.  The left lung is normally aerated. 3.  Cardiomegaly. Electronically Signed   By: Eddie Candle M.D.   On: 05/30/2019 14:58   DG CHEST PORT 1 VIEW  Result Date: 05/28/2019 CLINICAL DATA:  Shortness of breath. EXAM: PORTABLE CHEST 1 VIEW COMPARISON:  05/27/2019.  CT 05/28/2019.  04/29/2019. FINDINGS: Mediastinum unchanged. Stable cardiomegaly. Dense right middle lobe atelectatic  changes again noted. Underlying mass lesion again cannot be excluded. Right pleural effusion has increased in size. Reference is made to today's chest CT for further evaluation. No pneumothorax. IMPRESSION: 1. Persistent right middle lobe atelectasis. Underlying mass lesion cannot be excluded. 2. Progressive right  pleural effusion. Reference is made to today's chest CT for further evaluation. Electronically Signed   By: Marcello Moores  Register   On: 05/28/2019 12:10   DG Chest Port 1 View  Result Date: 05/27/2019 CLINICAL DATA:  Pleural effusion. EXAM: PORTABLE CHEST 1 VIEW COMPARISON:  Chest x-ray May 27, 2019 FINDINGS: The right-sided pleural effusion is smaller in the interval after thoracentesis. No pneumothorax. Centralized somewhat masslike opacities seen in the right perihilar region, better assessed on previous CT imaging. A left lower veins clear. The cardiomediastinal silhouette is stable. IMPRESSION: 1. Centralized somewhat masslike opacity in the right perihilar region was better assessed on previous CT imaging and is stable. 2. The right-sided pleural effusion is smaller in the interval after thoracentesis with no pneumothorax. Electronically Signed   By: Dorise Bullion III M.D   On: 05/27/2019 18:51   DG Chest Portable 1 View  Result Date: 05/27/2019 CLINICAL DATA:  Patient brought in by wife stating patient c/o shortness of breath, decreased appetite, dizziness and pain all over. EXAM: PORTABLE CHEST 1 VIEW COMPARISON:  04/30/2019 and earlier exams. FINDINGS: There is significant increased opacity on the right when compared to the most recent prior chest radiograph. There is at least a moderate pleural effusion. Additional opacity right perihilar and lower lung consistent with atelectasis, pneumonia or a combination. This has also increased from the prior exam. Left lung remains clear.  No left pleural effusion. Cardiac silhouette is normal in size. No mediastinal or left hilar mass. Right hilum partly obscured. Skeletal structures are grossly intact. IMPRESSION: 1. Worsening aeration on the right compared to the most recent prior study. Previously noted right pleural effusion has enlarged. There is increased opacity in the right perihilar and right lung base is consistent with pneumonia, atelectasis  or a combination, with atelectasis favored. Electronically Signed   By: Lajean Manes M.D.   On: 05/27/2019 12:07   IR THORACENTESIS ASP PLEURAL SPACE W/IMG GUIDE  Result Date: 05/31/2019 INDICATION: Patient with history of AFib on Eliquis, recently diagnosed malignant pleural effusion presented to the ED today with worsening dyspnea. Chest x-ray shows large right pleural effusion. Request IR for diagnostic and therapeutic thoracentesis. EXAM: ULTRASOUND GUIDED RIGHT THORACENTESIS MEDICATIONS: 8 mL 1% lidocaine COMPLICATIONS: None immediate. PROCEDURE: An ultrasound guided thoracentesis was thoroughly discussed with the patient and questions answered. The benefits, risks, alternatives and complications were also discussed. The patient understands and wishes to proceed with the procedure. Written consent was obtained. Ultrasound was performed to localize and mark an adequate pocket of fluid in the right chest. The area was then prepped and draped in the normal sterile fashion. 1% Lidocaine was used for local anesthesia. Under ultrasound guidance a 6 Fr Safe-T-Centesis catheter was introduced. Thoracentesis was performed. The catheter was removed and a dressing applied. FINDINGS: A total of approximately 1.3 L of bloody fluid was removed. Samples were sent to the laboratory as requested by the clinical team. IMPRESSION: Successful ultrasound guided right thoracentesis yielding 1.3 L of pleural fluid. Read by Candiss Norse, PA-C Electronically Signed   By: Lucrezia Europe M.D.   On: 05/27/2019 17:07     Subjective: No new complaints.   Discharge Exam: Vitals:   06/21/19 1222 06/21/19 1241  BP:  114/74   Pulse: (!) 101   Resp: 16   Temp:  (!) 97.4 F (36.3 C)  SpO2: 94%    Vitals:   06/20/19 2116 06/21/19 0527 06/21/19 1222 06/21/19 1241  BP: 128/77 117/81 114/74   Pulse: 95 (!) 105 (!) 101   Resp: 20 16 16    Temp: 97.7 F (36.5 C) (!) 97.5 F (36.4 C)  (!) 97.4 F (36.3 C)  TempSrc: Oral  Oral  Oral  SpO2: 95% 97% 94%   Weight:      Height:        General: Pt is alert, awake,  Cardiovascular: RRR, S1/S2 +, no rubs, no gallops Respiratory: CTA bilaterally, Abdominal: Soft, NT, ND, bowel sounds + Extremities: no edema, no cyanosis    The results of significant diagnostics from this hospitalization (including imaging, microbiology, ancillary and laboratory) are listed below for reference.     Microbiology: Recent Results (from the past 240 hour(s))  Blood culture (routine x 2)     Status: None   Collection Time: 06/16/19  5:00 AM   Specimen: BLOOD RIGHT ARM  Result Value Ref Range Status   Specimen Description   Final    BLOOD RIGHT ARM Performed at [facility], [address] [place], [place] [address]    Special Requests   Final    BOTTLES DRAWN AEROBIC AND ANAEROBIC Blood Culture adequate volume Performed at [facility], [address] [place], [place] [address]    Culture   Final    NO GROWTH 5 DAYS Performed at [facility], [place] [address], [place], [place] [address]    Report Status 06/21/2019 FINAL  Final  Blood culture (routine x 2)     Status: None   Collection Time: 06/16/19  5:30 AM   Specimen: BLOOD RIGHT HAND  Result Value Ref Range Status   Specimen Description   Final    BLOOD RIGHT HAND Performed at [facility], [address] [place], [place] [address]    Special Requests   Final    BOTTLES DRAWN AEROBIC AND ANAEROBIC Blood Culture results may not be optimal due to an inadequate volume of blood received in culture bottles Performed at [facility], [address] [place], [place] [address]    Culture   Final    NO GROWTH 5 DAYS Performed at [facility], [place] [address], [place], [place] [address]    Report Status 06/21/2019 FINAL  Final  SARS CORONAVIRUS 2 (TAT 6-24 HRS) Nasopharyngeal Nasopharyngeal Swab     Status: None   Collection Time: 06/16/19  5:35 AM   Specimen:  Nasopharyngeal Swab  Result Value Ref Range Status   SARS Coronavirus 2 NEGATIVE NEGATIVE Final    Comment: (NOTE) SARS-CoV-2 target nucleic acids are NOT DETECTED. The SARS-CoV-2 RNA is generally detectable in upper and lower respiratory specimens during the acute phase of infection. Negative results do not preclude SARS-CoV-2 infection, do not rule out co-infections with other pathogens, and should not be used as the sole basis for treatment or other patient management decisions. Negative results must be combined with clinical observations, patient history, and epidemiological information. The expected result is Negative. Fact Sheet for Patients: SugarRoll.be Fact Sheet for Healthcare Providers: https://www.woods-mathews.com/ This test is not yet approved or cleared by the [place] FDA and  has been authorized for detection and/or diagnosis of SARS-CoV-2 by FDA under an Emergency Use  Authorization (EUA). This EUA will remain  in effect (meaning this test can be used) for the duration of the COVID-19 declaration under Section 56 4(b)(1) of the Act, 21 U.S.C. section 360bbb-3(b)(1), unless the authorization is terminated or revoked sooner. Performed at [facility], [place] [address], [place], [place] [address]   Urine culture     Status: None   Collection Time: 06/16/19  5:55 AM   Specimen: Urine, Random  Result Value Ref Range Status   Specimen Description   Final    URINE, RANDOM Performed at [facility], [address] [place], [place] [address]    Special Requests   Final    NONE Performed at [facility], [address] [place], [place] [address]    Culture   Final    NO GROWTH Performed at [facility], [place] [address] [place], [place] [address]    Report Status 06/17/2019 FINAL  Final     Labs: BNP (last 3 results) No results for input(s): BNP in the last 8760 hours. Basic Metabolic  Panel: Recent Labs  Lab 06/18/19 0513 06/18/19 1723 06/19/19 0549 06/19/19 1628 06/20/19 0556  NA 136 137 138 138 141  K 4.2 4.3 4.7 5.0 4.8  CL 101 105 104 106 105  CO2 21* 20* 20* 21* 20*  GLUCOSE 211* 246* 332* 212* 336*  BUN 59* 50* 52* 53* 50*  CREATININE 1.57* 1.42* 1.53* 1.69* 1.36*  CALCIUM 7.9* 7.8* 7.8* 7.7* 7.9*   Liver Function Tests: No results for input(s): AST, ALT, ALKPHOS, BILITOT, PROT, ALBUMIN in the last 168 hours. No results for input(s): LIPASE, AMYLASE in the last 168 hours. No results for input(s): AMMONIA in the last 168 hours. CBC: Recent Labs  Lab 06/18/19 0513 06/19/19 0549 06/20/19 0556 06/21/19 0518  WBC 20.3* 21.0* 18.1* 19.5*  NEUTROABS 16.0* 14.3* 14.2* 15.3*  HGB 9.9* 10.3* 10.3* 10.4*  HCT 30.7* 32.8* 32.4* 33.2*  MCV 90.0 92.4 91.8 92.0  PLT 135* 132* 142* 136*   Cardiac Enzymes: No results for input(s): CKTOTAL, CKMB, CKMBINDEX, TROPONINI in the last 168 hours. BNP: Invalid input(s): POCBNP CBG: Recent Labs  Lab 06/20/19 1141 06/20/19 1717 06/20/19 2120 06/21/19 0723 06/21/19 1128  GLUCAP 370* 237* 237* 312* 211*   D-Dimer No results for input(s): DDIMER in the last 72 hours. Hgb A1c No results for input(s): HGBA1C in the last 72 hours. Lipid Profile No results for input(s): CHOL, HDL, LDLCALC, TRIG, CHOLHDL, LDLDIRECT in the last 72 hours. Thyroid function studies No results for input(s): TSH, T4TOTAL, T3FREE, THYROIDAB in the last 72 hours.  Invalid input(s): FREET3 Anemia work up No results for input(s): VITAMINB12, FOLATE, FERRITIN, TIBC, IRON, RETICCTPCT in the last 72 hours. Urinalysis    Component Value Date/Time   COLORURINE YELLOW 06/16/2019 [place] 06/16/2019 0555   LABSPEC 1.010 06/16/2019 0555   PHURINE 6.5 06/16/2019 0555   GLUCOSEU >=500 (A) 06/16/2019 0555   HGBUR NEGATIVE 06/16/2019 0555   BILIRUBINUR NEGATIVE 06/16/2019 0555   KETONESUR NEGATIVE 06/16/2019 0555   PROTEINUR  NEGATIVE 06/16/2019 0555   NITRITE NEGATIVE 06/16/2019 0555   LEUKOCYTESUR NEGATIVE 06/16/2019 0555   Sepsis Labs Invalid input(s): PROCALCITONIN,  WBC,  LACTICIDVEN Microbiology Recent Results (from the past 240 hour(s))  Blood culture (routine x 2)     Status: None   Collection Time: 06/16/19  5:00 AM   Specimen: BLOOD RIGHT ARM  Result Value Ref Range  Status   Specimen Description   Final    BLOOD RIGHT ARM Performed at [facility], [address] [place], [place] [address]    Special Requests   Final    BOTTLES DRAWN AEROBIC AND ANAEROBIC Blood Culture adequate volume Performed at [facility], [address] [place], [place] [address]    Culture   Final    NO GROWTH 5 DAYS Performed at [facility], [place] [address] [place], [place] [address]    Report Status 06/21/2019 FINAL  Final  Blood culture (routine x 2)     Status: None   Collection Time: 06/16/19  5:30 AM   Specimen: BLOOD RIGHT HAND  Result Value Ref Range Status   Specimen Description   Final    BLOOD RIGHT HAND Performed at [facility], [address] [place], [place] [address]    Special Requests   Final    BOTTLES DRAWN AEROBIC AND ANAEROBIC Blood Culture results may not be optimal due to an inadequate volume of blood received in culture bottles Performed at [facility], [address] [place], [place] [address]    Culture   Final    NO GROWTH 5 DAYS Performed at [facility], [place] [address], [place], [place] [address]    Report Status 06/21/2019 FINAL  Final  SARS CORONAVIRUS 2 (TAT 6-24 HRS) Nasopharyngeal Nasopharyngeal Swab     Status: None   Collection Time: 06/16/19  5:35 AM   Specimen: Nasopharyngeal Swab  Result Value Ref Range Status   SARS Coronavirus 2 NEGATIVE NEGATIVE Final    Comment: (NOTE) SARS-CoV-2 target nucleic acids are NOT DETECTED. The SARS-CoV-2 RNA is generally detectable in upper and lower respiratory specimens  during the acute phase of infection. Negative results do not preclude SARS-CoV-2 infection, do not rule out co-infections with other pathogens, and should not be used as the sole basis for treatment or other patient management decisions. Negative results must be combined with clinical observations, patient history, and epidemiological information. The expected result is Negative. Fact Sheet for Patients: SugarRoll.be Fact Sheet for Healthcare Providers: https://www.woods-mathews.com/ This test is not yet approved or cleared by the [place] FDA and  has been authorized for detection and/or diagnosis of SARS-CoV-2 by FDA under an Emergency Use Authorization (EUA). This EUA will remain  in effect (meaning this test can be used) for the duration of the COVID-19 declaration under Section 56 4(b)(1) of the Act, 21 U.S.C. section 360bbb-3(b)(1), unless the authorization is terminated or revoked sooner. Performed at [facility], [place] [address] [place], [place] [address]   Urine culture     Status: None   Collection Time: 06/16/19  5:55 AM   Specimen: Urine, Random  Result Value Ref Range Status   Specimen Description   Final    URINE, RANDOM Performed at [facility], [address] [place], [place] [address]    Special Requests   Final    NONE Performed at [facility], [address] [place], [place] [address]    Culture   Final    NO GROWTH Performed at [facility], [place] [address], [place],  [address]    Report Status 06/17/2019 FINAL  Final     Time coordinating discharge: 34 minutes  SIGNED:   Hosie Poisson, MD  [facility] 06/24/2019, 10:12 AM Pager   If  7PM-7AM, please contact night-coverage [email or web address] Password [email or web address]

## 2019-06-25 ENCOUNTER — Encounter (HOSPITAL_COMMUNITY): Payer: Self-pay | Admitting: Hematology and Oncology

## 2019-06-25 ENCOUNTER — Ambulatory Visit (HOSPITAL_COMMUNITY): Payer: No Typology Code available for payment source

## 2019-06-28 ENCOUNTER — Inpatient Hospital Stay: Payer: Medicare Other

## 2019-07-08 ENCOUNTER — Other Ambulatory Visit: Payer: Self-pay | Admitting: Internal Medicine

## 2019-07-08 ENCOUNTER — Ambulatory Visit: Payer: No Typology Code available for payment source | Admitting: Critical Care Medicine

## 2019-07-16 DEATH — deceased

## 2019-09-03 NOTE — Progress Notes (Signed)
  Radiation Oncology         [phone]) [phone] ________________________________  Name: Brian French. MRN: [id number]  Date: 06/18/2019  DOB: 07-25-1940  End of Treatment Note  Diagnosis:   Lung cancer     Indication for treatment::  palliative       Radiation treatment dates:   06/08/19 - 06/18/19  Site/dose:   The patient was planned to be treated to the right lung to a dose of 30 Gy in 10 fractions using a 4-field 3D conformal technique.   Narrative: The patient tolerated radiation treatment, but his status led to the patient deciding to discontinue XRT after 6 fractions. He was proceeding with hospice.  Plan: The patient has completed radiation treatment. The patient will return to clinic on a prn basis as needed.  ________________________________  Jodelle Gross, M.D., Ph.D.

## 2019-09-16 NOTE — Progress Notes (Signed)
  Radiation Oncology         [phone]) [phone] ________________________________  Name: Brian French. MRN: [id number]  Date: 06/04/2019  DOB: [birth year]  SIMULATION AND TREATMENT PLANNING NOTE  DIAGNOSIS:     ICD-10-CM   1. Malignant neoplasm of right upper lobe of lung ([place])  C34.11      Site:  chest  NARRATIVE:  The patient was brought to the [place].  Identity was confirmed.  All relevant records and images related to the planned course of therapy were reviewed.   Written consent to proceed with treatment was confirmed which was freely given after reviewing the details related to the planned course of therapy had been reviewed with the patient.  Then, the patient was set-up in a stable reproducible  supine position for radiation therapy.  CT images were obtained.  Surface markings were placed.    Medically necessary complex treatment device(s) for immobilization:  Customized vac-lock bag.   The CT images were loaded into the planning software.  Then the target and avoidance structures were contoured.  Treatment planning then occurred.  The radiation prescription was entered and confirmed.  A total of 4 complex treatment devices were fabricated which relate to the designed radiation treatment fields. Additional reduced fields will be used as necessary to improve the dose homogeneity of the plan. Each of these customized fields/ complex treatment devices will be used on a daily basis during the radiation course. I have requested : 3D Simulation  I have requested a DVH of the following structures: target volume, spinal cord, lungs, heart.   The patient will undergo daily image guidance to ensure accurate localization of the target, and adequate minimize dose to the normal surrounding structures in close proximity to the target.   PLAN:  The patient will receive 30 Gy in 10 fractions.    ________________________________   Jodelle Gross, MD, PhD

## 2020-07-01 IMAGING — DX DG CHEST 1V PORT
1 series · 1 of 1 positions shown · non-contrast
Comparison: Chest x-ray May 27, 2019

CLINICAL DATA: Pleural effusion.

EXAM:
PORTABLE CHEST 1 VIEW

[chest ap]
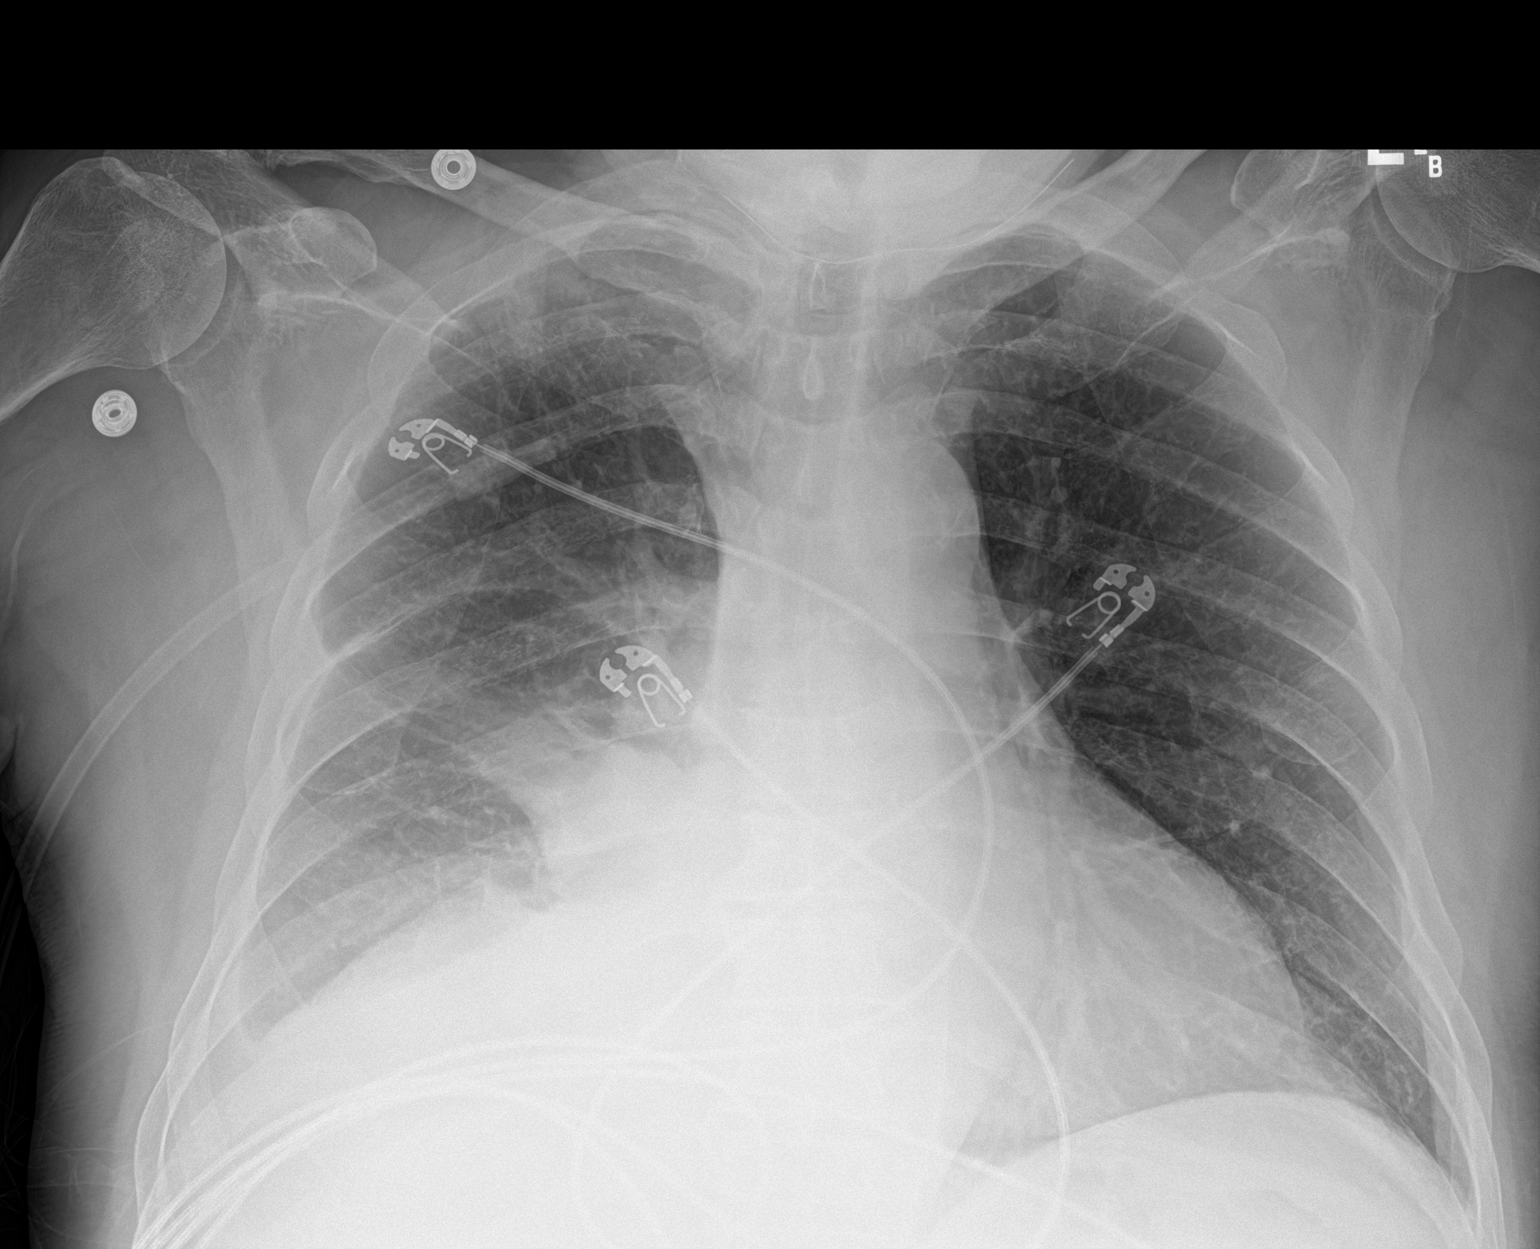

[1 of 1 positions shown; findings below may reference images not displayed]

FINDINGS: The right-sided pleural effusion is smaller in the interval after
thoracentesis. No pneumothorax. Centralized somewhat masslike
opacities seen in the right perihilar region, better assessed on
previous CT imaging. A left lower veins clear. The cardiomediastinal
silhouette is stable.
IMPRESSION: 1. Centralized somewhat masslike opacity in the right perihilar
region was better assessed on previous CT imaging and is stable.
2. The right-sided pleural effusion is smaller in the interval after
thoracentesis with no pneumothorax.

## 2020-07-03 IMAGING — MR MR HEAD W/ CM
3 series · 27 of 48 positions shown · IV contrast (9 GV)
Comparison: Unenhanced MRI head 05/29/2019

CLINICAL DATA: Lung cancer rule out metastatic disease.

EXAM:
MRI HEAD WITH CONTRAST
TECHNIQUE: Multiplanar, multiecho pulse sequences of the brain and surrounding
structures were obtained with intravenous contrast.
CONTRAST:  9mL GADAVIST GADOBUTROL 1 MMOL/ML IV SOLN

[Series 4: T1 · axial · 3.0mm · 0.94mm/px · z∈[-119,+23]mm · 11 of 54 slices shown (1 of 2)]
[im 3/54]
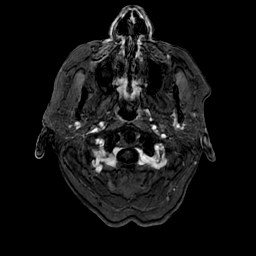
[im 8/54]
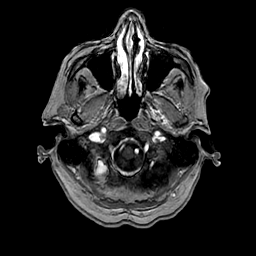
[im 11/54]
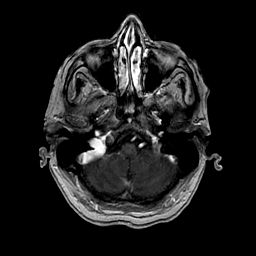
[im 16/54]
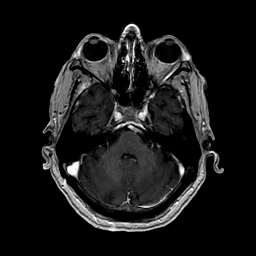
[im 23/54]
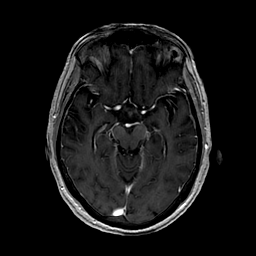
[im 28/54]
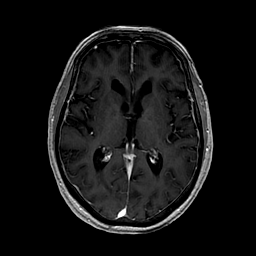
[im 31/54]
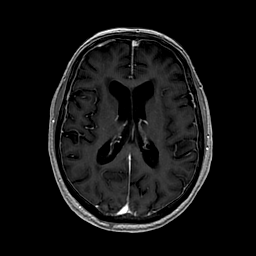
[im 38/54]
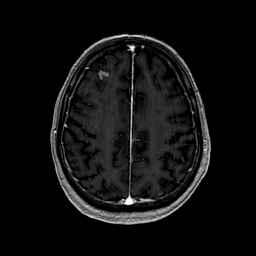
[im 43/54]
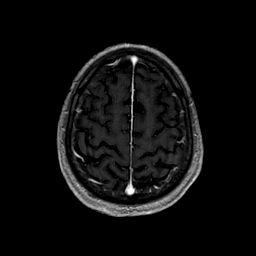
[im 46/54]
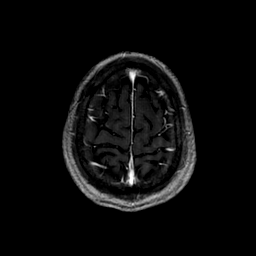
[im 51/54]
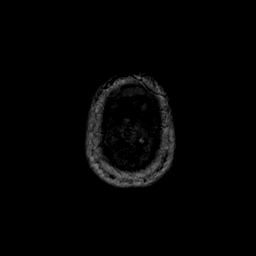

[Series 5: T1 · coronal · 5.0mm · 0.43mm/px · 7 of 30 slices shown (2 of 2)]
[im 1/30]
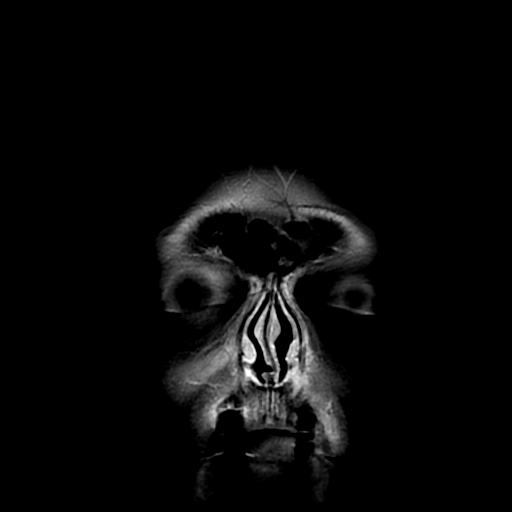
[im 5/30]
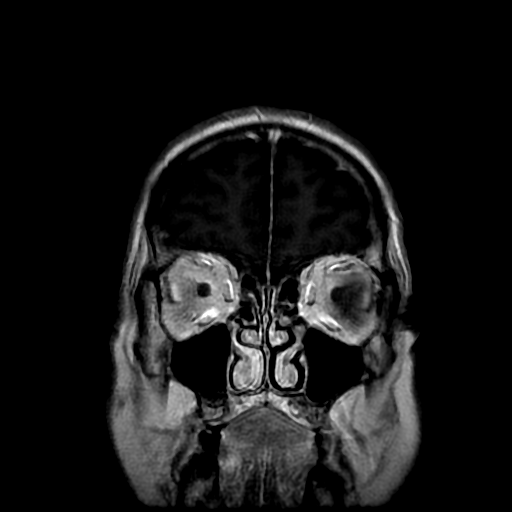
[im 10/30]
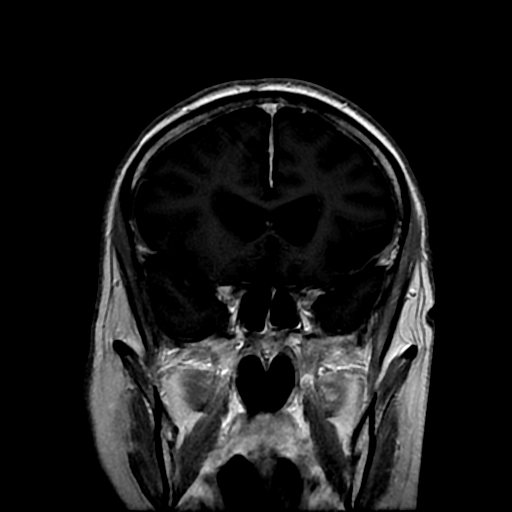
[im 13/30]
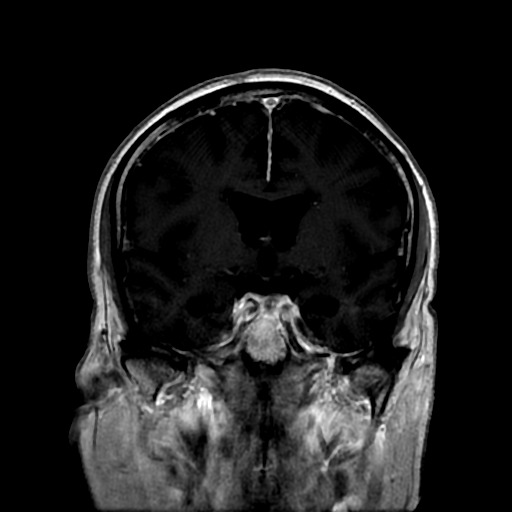
[im 15/30]
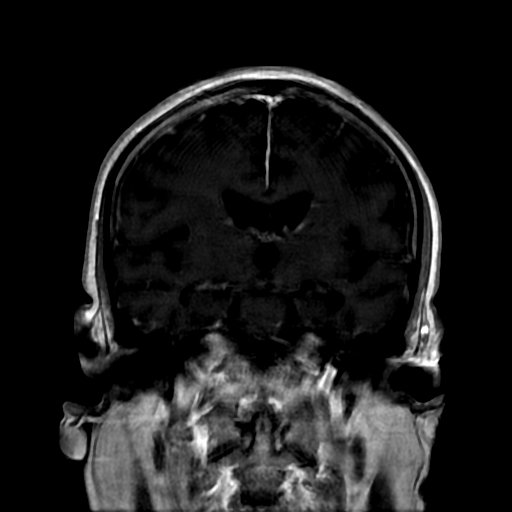
[im 17/30]
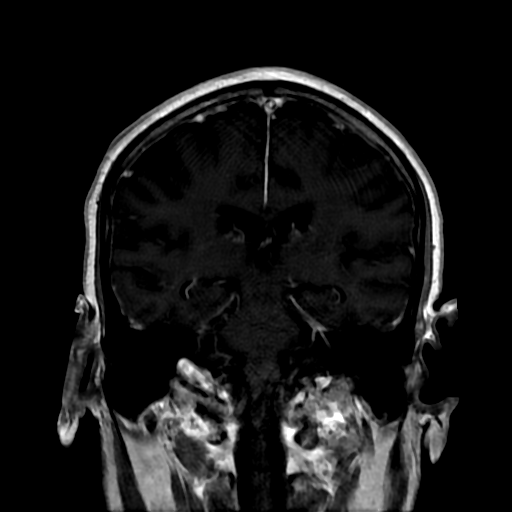
[im 25/30]
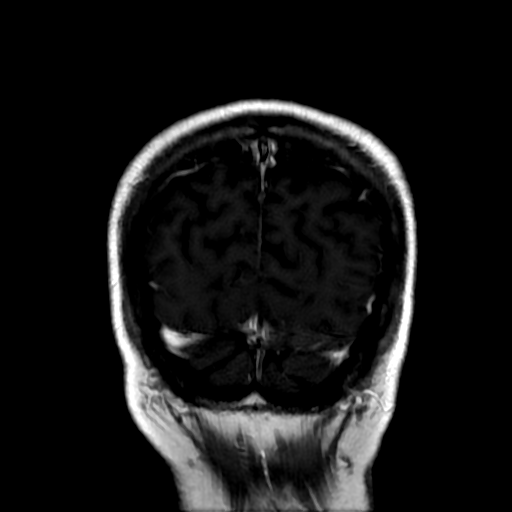

[Series 6: T2 post-contrast · coronal · 5.0mm · 0.43mm/px · 9 of 30 slices shown]
[im 1/30]
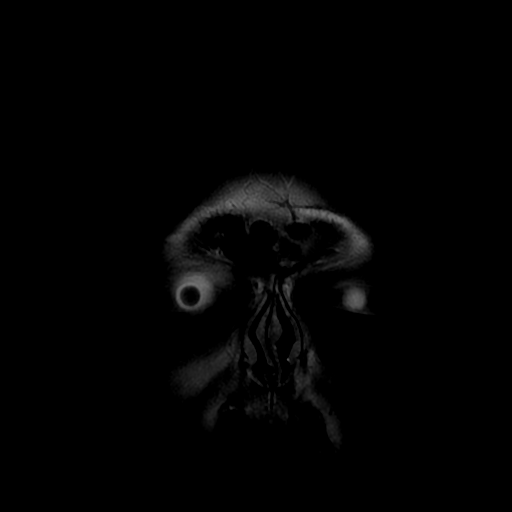
[im 5/30]
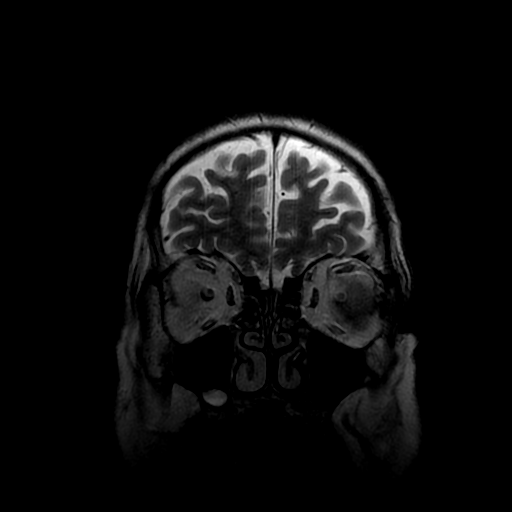
[im 10/30]
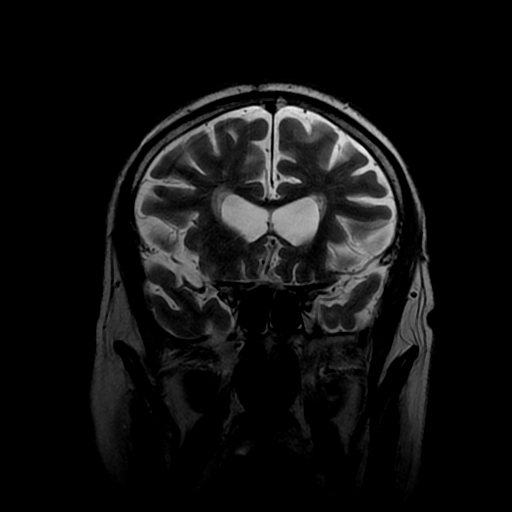
[im 13/30]
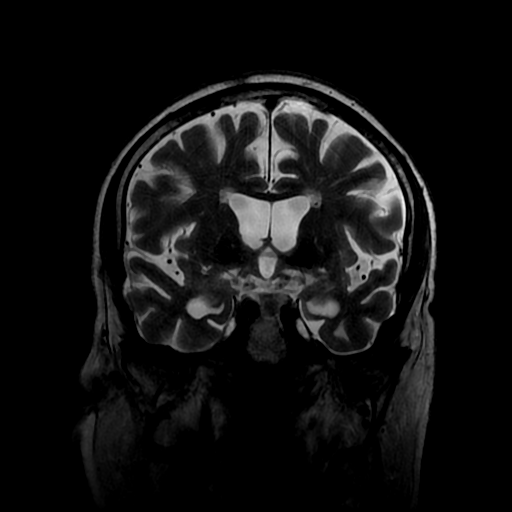
[im 15/30]
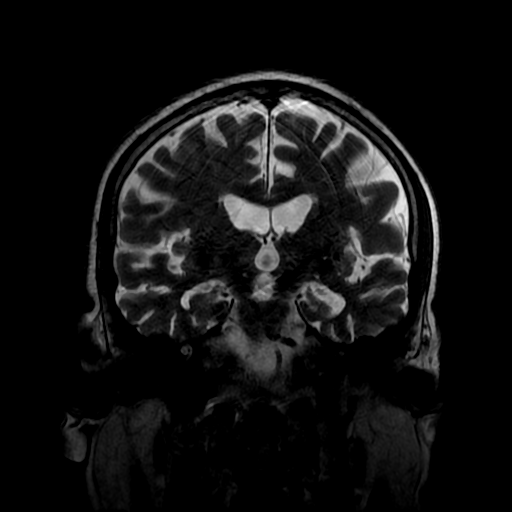
[im 17/30]
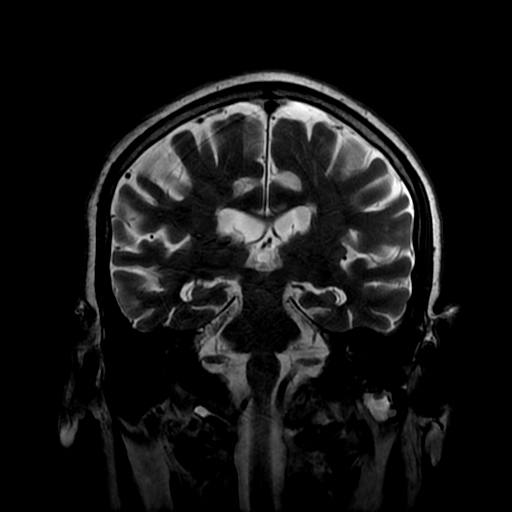
[im 20/30]
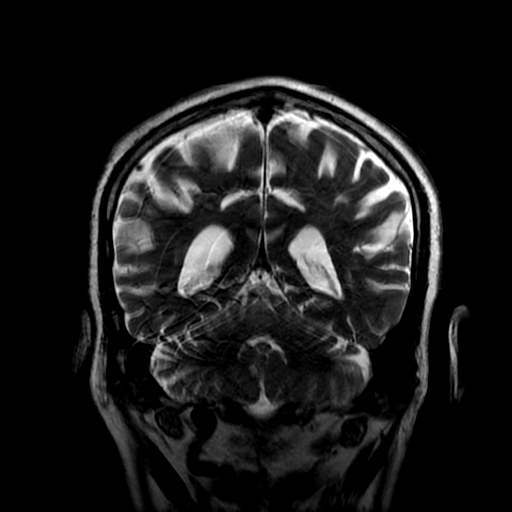
[im 25/30]
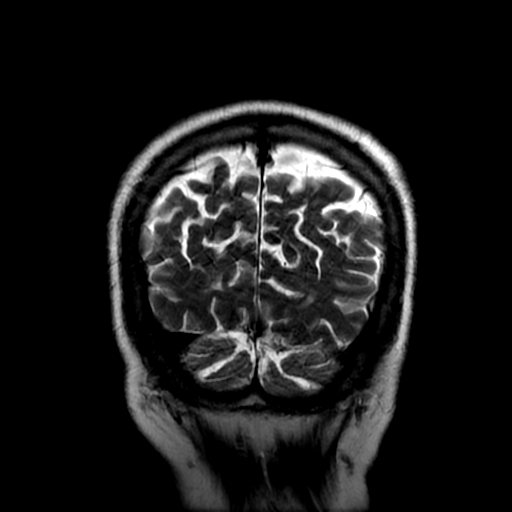
[im 30/30]
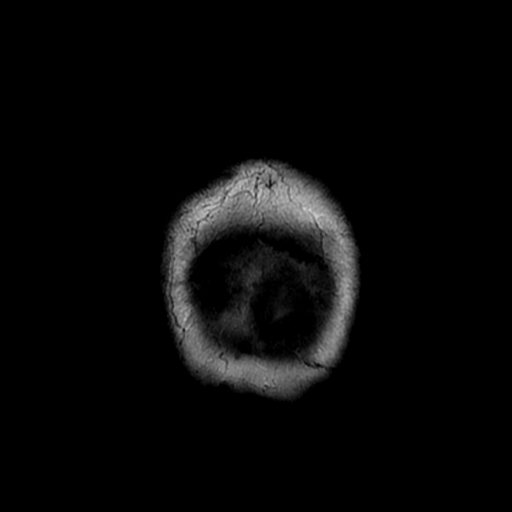

[27 of 48 positions shown; findings below may reference images not displayed]

FINDINGS: Right frontal lobe abnormality shows stellate enhancement. This area
showed susceptibility suggestive of hemorrhage with surrounding
edema. The stellate pattern of enhancement is unusual for metastatic
disease. No other enhancing lesions in the brain identified.

Ventricle size normal.  No midline shift.
IMPRESSION: Right frontal lobe lesion shows stellate enhancement. This pattern
is unusual for metastatic disease. Given the susceptibility and
surrounding edema, continue to favor metastatic disease however
enhancing subacute infarct is a consideration and short-term MRI
follow-up without and with contrast is recommended in 2-4 weeks.

## 2020-07-03 IMAGING — MR MR HEAD W/O CM
9 of 11 series · 33 of 48 positions shown · non-contrast
Comparison: MRI head 05/09/2015

CLINICAL DATA: Combined small and non smell also lung cancer.
Staging.

EXAM:
MRI HEAD WITHOUT CONTRAST
TECHNIQUE: Multiplanar, multiecho pulse sequences of the brain and surrounding
structures were obtained without intravenous contrast.

[Series 2: DWI · axial · 3.0mm · 0.94mm/px · z∈[-113,+39]mm · 8 of 104 slices shown (1 of 2)]
[im 1/104]
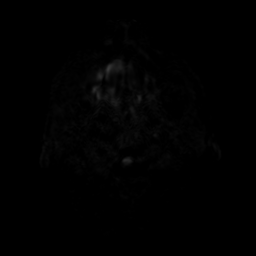
[im 15/104]
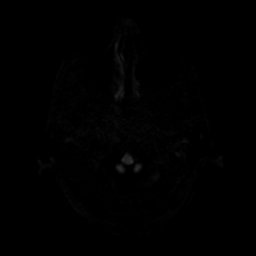
[im 30/104]
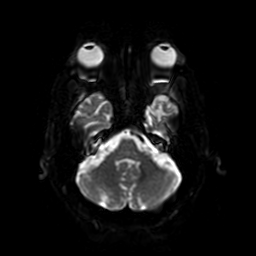
[im 45/104]
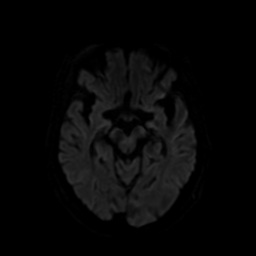
[im 59/104]
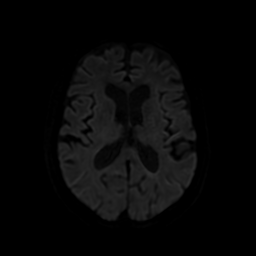
[im 74/104]
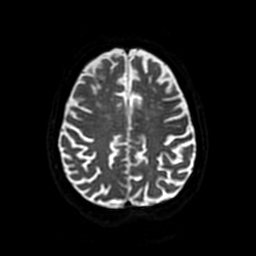
[im 89/104]
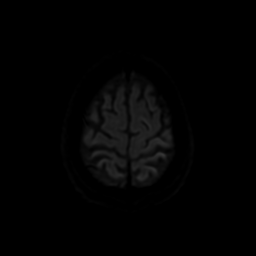
[im 104/104]
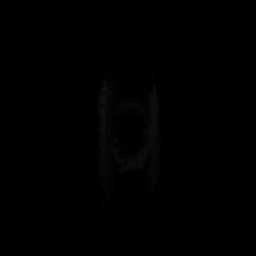

[Series 3: DWI · coronal · 4.0mm · 0.94mm/px · 5 of 72 slices shown (2 of 2)]
[im 1/72]
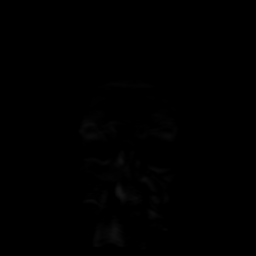
[im 18/72]
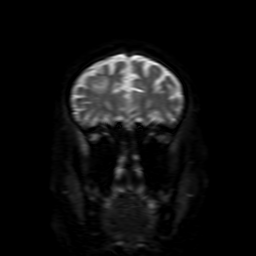
[im 36/72]
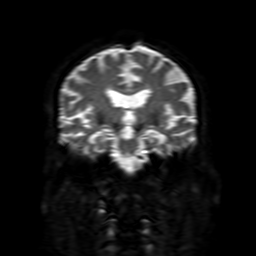
[im 54/72]
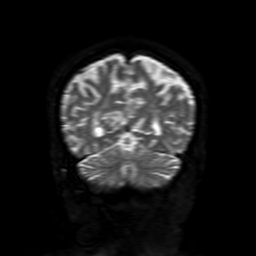
[im 72/72]
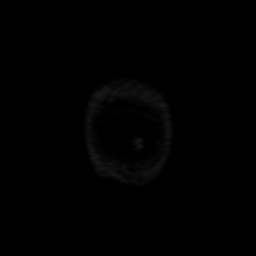

[Series 4: FLAIR · axial · 3.0mm · 0.45mm/px · z∈[-108,+41]mm · 2 of 26 slices shown (1 of 2)]
[im 1/26]
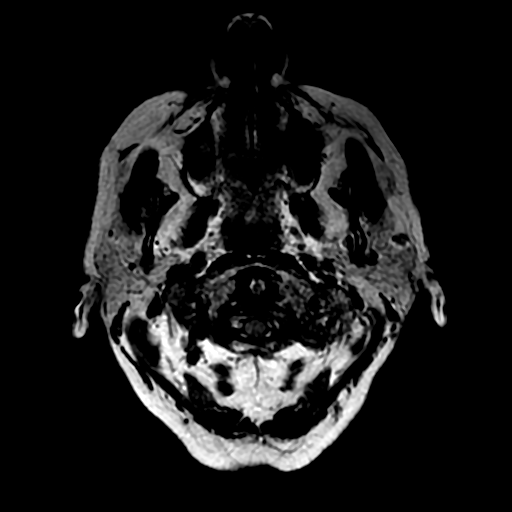
[im 26/26]
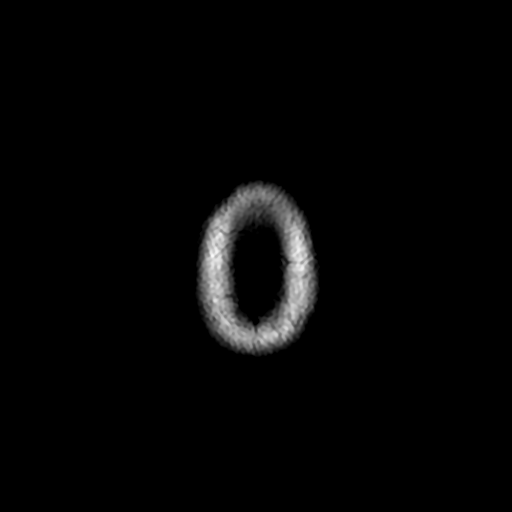

[Series 5: SWI · axial · 3.0mm · 0.47mm/px · z∈[-110,-24]mm · 5 of 104 slices shown]
[im 1/104]
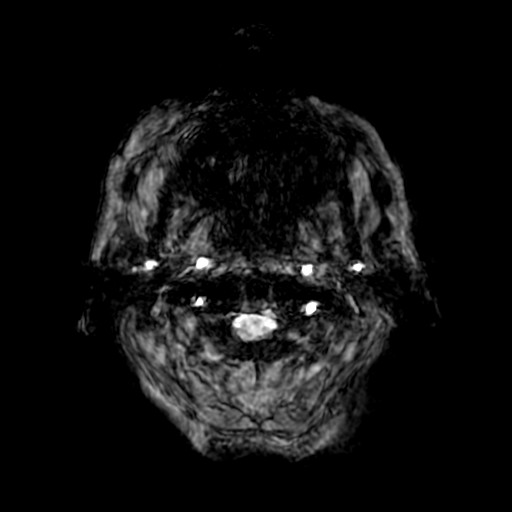
[im 15/104]
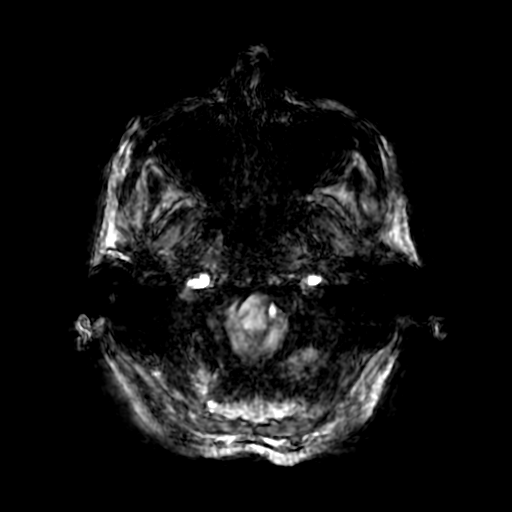
[im 30/104]
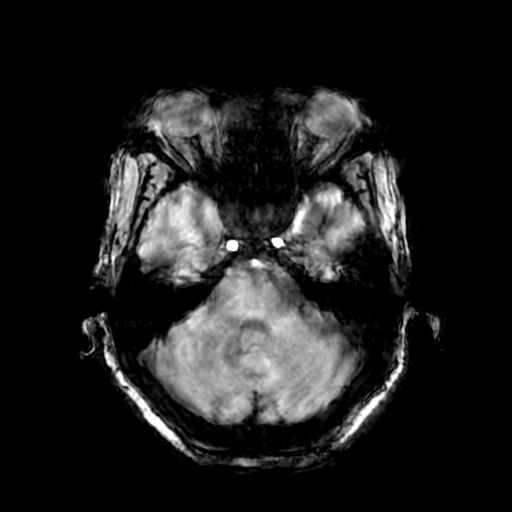
[im 45/104]
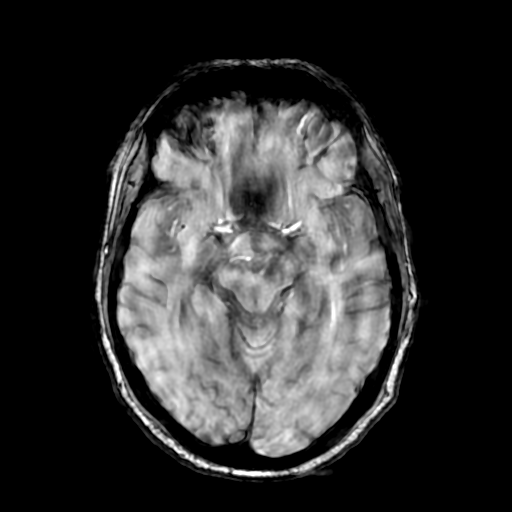
[im 59/104]
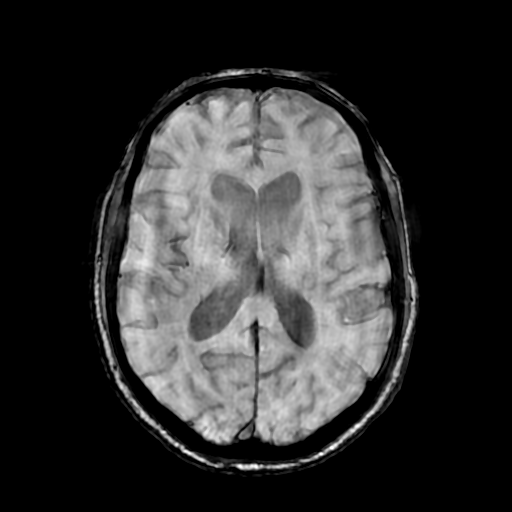

[Series 6: FLAIR · sagittal · 5.0mm · 0.47mm/px · 2 of 25 slices shown (2 of 2)]
[im 1/25]
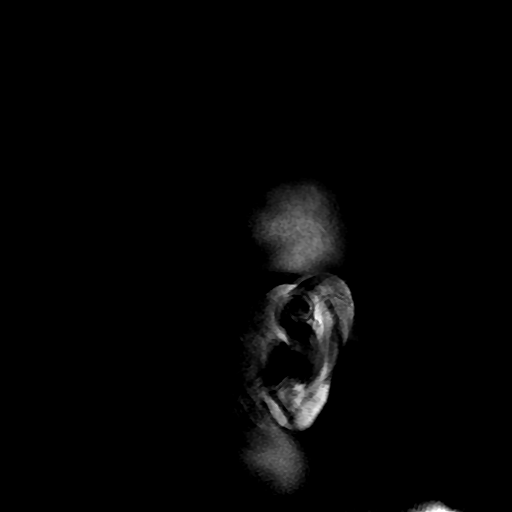
[im 25/25]
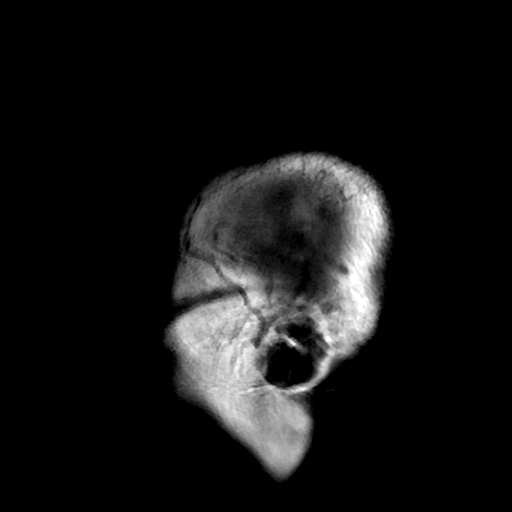

[Series 7: T2 · axial · 5.0mm · 0.45mm/px · z∈[-108,+41]mm · 2 of 26 slices shown (1 of 2)]
[im 1/26]
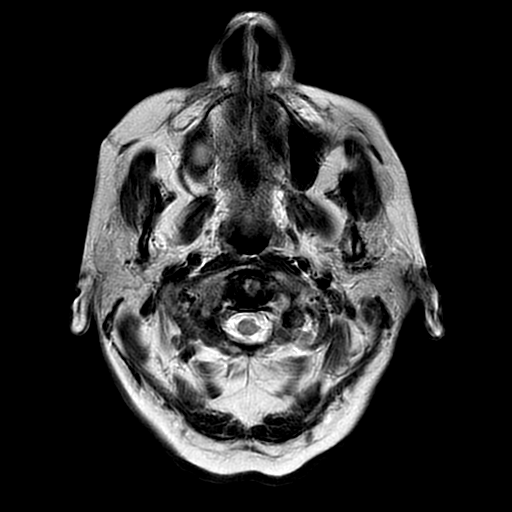
[im 26/26]
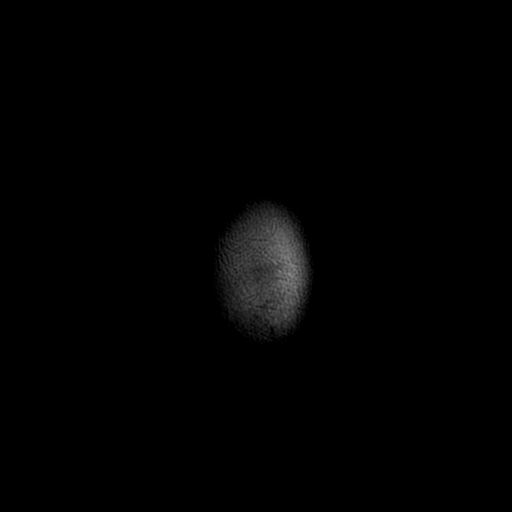

[Series 9: T2 · coronal · 5.0mm · 0.39mm/px · 2 of 30 slices shown (2 of 2)]
[im 1/30]
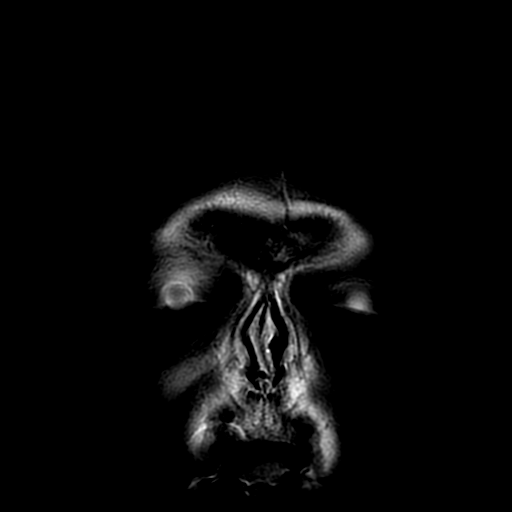
[im 30/30]
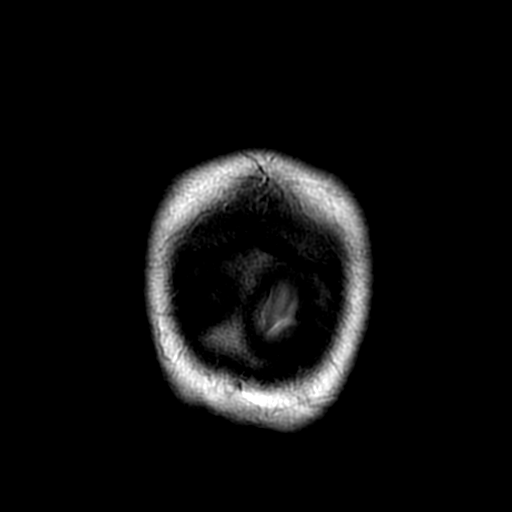

[Series 250: ADC · axial · 3.0mm · 0.94mm/px · z∈[-113,+39]mm · 4 of 52 slices shown (1 of 2)]
[im 1/52]
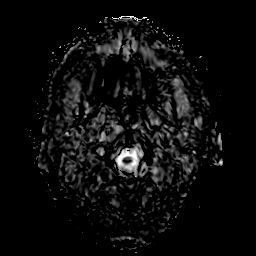
[im 18/52]
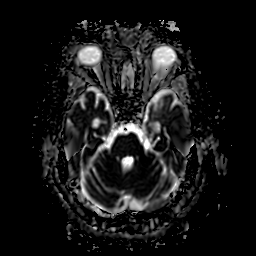
[im 35/52]
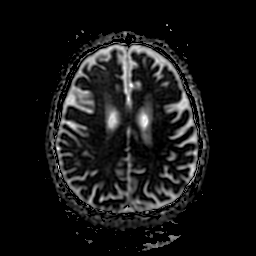
[im 52/52]
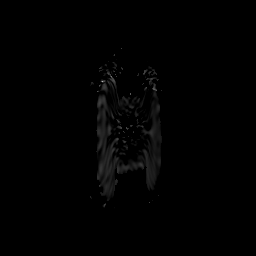

[Series 350: ADC · coronal · 4.0mm · 0.94mm/px · 3 of 36 slices shown (2 of 2)]
[im 1/36]
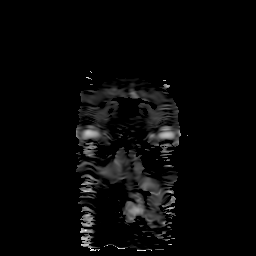
[im 18/36]
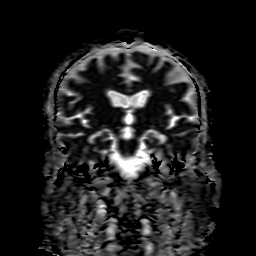
[im 36/36]
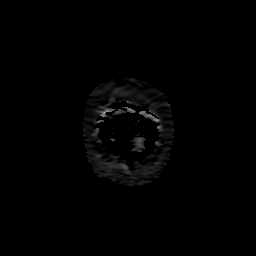

[33 of 48 positions shown; findings below may reference images not displayed]

FINDINGS: Brain: New lesion in the right frontal lobe with surrounding edema
and mild hemorrhage. This is highly likely to be metastatic disease.
Postcontrast imaging recommended to confirm.

Multiple small white matter hyperintensities bilaterally are
indeterminate. Small lesions in the right parietal subcortical white
matter are suspicious for metastatic disease but contrast enhanced
imaging is needed.

Generalized atrophy.  Negative for acute infarct.

Vascular: Normal arterial flow voids

Skull and upper cervical spine: No focal skeletal abnormality.

Sinuses/Orbits: Negative

Other: Image quality degraded by motion. Limited evaluation for
metastatic disease without intravenous contrast
IMPRESSION: Hemorrhagic lesion right frontal lobe less than 1 cm with
surrounding edema, highly suspicious for metastatic disease.
Recommend postcontrast imaging for confirmation and evaluate for
other possible lesions.

Generalized atrophy.  No acute infarct.
